# Patient Record
Sex: Male | Born: 1960 | Race: Black or African American | Hispanic: No | Marital: Single | Smoking: Current every day smoker
Health system: Southern US, Community
[De-identification: ages and names within clinical notes are randomized; demographics above are authoritative.]

## PROBLEM LIST (undated history)

## (undated) DIAGNOSIS — I10 Essential (primary) hypertension: Secondary | ICD-10-CM

## (undated) DIAGNOSIS — F329 Major depressive disorder, single episode, unspecified: Secondary | ICD-10-CM

## (undated) DIAGNOSIS — F419 Anxiety disorder, unspecified: Secondary | ICD-10-CM

## (undated) DIAGNOSIS — S34109A Unspecified injury to unspecified level of lumbar spinal cord, initial encounter: Secondary | ICD-10-CM

## (undated) DIAGNOSIS — F32A Depression, unspecified: Secondary | ICD-10-CM

## (undated) DIAGNOSIS — F319 Bipolar disorder, unspecified: Secondary | ICD-10-CM

## (undated) DIAGNOSIS — E119 Type 2 diabetes mellitus without complications: Secondary | ICD-10-CM

## (undated) HISTORY — PX: EYE SURGERY: SHX253

---

## 2010-07-18 DIAGNOSIS — S34109A Unspecified injury to unspecified level of lumbar spinal cord, initial encounter: Secondary | ICD-10-CM

## 2010-07-18 HISTORY — DX: Unspecified injury to unspecified level of lumbar spinal cord, initial encounter: S34.109A

## 2010-07-18 HISTORY — PX: BACK SURGERY: SHX140

## 2014-06-05 ENCOUNTER — Emergency Department (HOSPITAL_COMMUNITY)
Admission: EM | Admit: 2014-06-05 | Discharge: 2014-06-06 | Disposition: A | Discharge status: Other Acute Inpatient Hospital | Payer: Medicaid Other | Attending: Emergency Medicine | Admitting: Emergency Medicine

## 2014-06-05 ENCOUNTER — Encounter (HOSPITAL_COMMUNITY): Payer: Self-pay

## 2014-06-05 DIAGNOSIS — Z79899 Other long term (current) drug therapy: Secondary | ICD-10-CM | POA: Insufficient documentation

## 2014-06-05 DIAGNOSIS — Z9889 Other specified postprocedural states: Secondary | ICD-10-CM | POA: Diagnosis not present

## 2014-06-05 DIAGNOSIS — Z720 Tobacco use: Secondary | ICD-10-CM | POA: Insufficient documentation

## 2014-06-05 DIAGNOSIS — Z87828 Personal history of other (healed) physical injury and trauma: Secondary | ICD-10-CM | POA: Diagnosis not present

## 2014-06-05 DIAGNOSIS — R45851 Suicidal ideations: Secondary | ICD-10-CM

## 2014-06-05 DIAGNOSIS — F319 Bipolar disorder, unspecified: Secondary | ICD-10-CM | POA: Diagnosis not present

## 2014-06-05 DIAGNOSIS — F32A Depression, unspecified: Secondary | ICD-10-CM

## 2014-06-05 DIAGNOSIS — F329 Major depressive disorder, single episode, unspecified: Secondary | ICD-10-CM

## 2014-06-05 HISTORY — DX: Unspecified injury to unspecified level of lumbar spinal cord, initial encounter: S34.109A

## 2014-06-05 HISTORY — DX: Depression, unspecified: F32.A

## 2014-06-05 HISTORY — DX: Bipolar disorder, unspecified: F31.9

## 2014-06-05 HISTORY — DX: Major depressive disorder, single episode, unspecified: F32.9

## 2014-06-05 LAB — CBC
HEMATOCRIT: 48.6 % (ref 39.0–52.0)
Hemoglobin: 16.8 g/dL (ref 13.0–17.0)
MCH: 28.2 pg (ref 26.0–34.0)
MCHC: 34.6 g/dL (ref 30.0–36.0)
MCV: 81.7 fL (ref 78.0–100.0)
Platelets: 272 10*3/uL (ref 150–400)
RBC: 5.95 MIL/uL — AB (ref 4.22–5.81)
RDW: 13.7 % (ref 11.5–15.5)
WBC: 8.3 10*3/uL (ref 4.0–10.5)

## 2014-06-05 LAB — COMPREHENSIVE METABOLIC PANEL
ALT: 28 U/L (ref 0–53)
AST: 20 U/L (ref 0–37)
Albumin: 3.7 g/dL (ref 3.5–5.2)
Alkaline Phosphatase: 93 U/L (ref 39–117)
Anion gap: 14 (ref 5–15)
BUN: 12 mg/dL (ref 6–23)
CO2: 23 mEq/L (ref 19–32)
Calcium: 9.5 mg/dL (ref 8.4–10.5)
Chloride: 103 mEq/L (ref 96–112)
Creatinine, Ser: 0.93 mg/dL (ref 0.50–1.35)
GFR calc Af Amer: >90 mL/min (ref >90)
GFR calc non Af Amer: >90 mL/min (ref >90)
GLUCOSE: 110 mg/dL — AB (ref 70–99)
Potassium: 4.3 mEq/L (ref 3.7–5.3)
SODIUM: 140 meq/L (ref 137–147)
Total Bilirubin: 0.4 mg/dL (ref 0.3–1.2)
Total Protein: 8 g/dL (ref 6.0–8.3)

## 2014-06-05 LAB — RAPID URINE DRUG SCREEN, HOSP PERFORMED
AMPHETAMINES: NOT DETECTED
Barbiturates: NOT DETECTED
Benzodiazepines: NOT DETECTED
Cocaine: NOT DETECTED
Opiates: NOT DETECTED
TETRAHYDROCANNABINOL: NOT DETECTED

## 2014-06-05 LAB — ETHANOL: Alcohol, Ethyl (B): <11 mg/dL (ref 0–11)

## 2014-06-05 LAB — SALICYLATE LEVEL

## 2014-06-05 LAB — ACETAMINOPHEN LEVEL: Acetaminophen (Tylenol), Serum: <15 ug/mL (ref 10–30)

## 2014-06-05 MED ORDER — NICOTINE 21 MG/24HR TD PT24: 21.0000 mg | MEDICATED_PATCH | Freq: Every day | TRANSDERMAL | Status: DC

## 2014-06-05 NOTE — ED Notes (Signed)
Pt states he was released from prison yesterday, went to [facility] today and was informed to come here. Pt has been having SI thoughts since being released yesterday from prison. Denies having a plan just having thoughts.
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
## 2014-06-05 NOTE — ED Notes (Signed)
[place], PA at bedside.  

## 2014-06-06 ENCOUNTER — Encounter (HOSPITAL_COMMUNITY): Payer: Self-pay | Admitting: *Deleted

## 2014-06-06 ENCOUNTER — Observation Stay (HOSPITAL_COMMUNITY)
Admission: AD | Admit: 2014-06-06 | Discharge: 2014-06-06 | Disposition: A | Discharge status: Home / Self Care | Payer: Medicaid Other | Source: Intra-hospital | Attending: Psychiatry | Admitting: Psychiatry

## 2014-06-06 DIAGNOSIS — F17210 Nicotine dependence, cigarettes, uncomplicated: Secondary | ICD-10-CM | POA: Insufficient documentation

## 2014-06-06 DIAGNOSIS — Z9114 Patient's other noncompliance with medication regimen: Secondary | ICD-10-CM | POA: Insufficient documentation

## 2014-06-06 DIAGNOSIS — F3160 Bipolar disorder, current episode mixed, unspecified: Secondary | ICD-10-CM

## 2014-06-06 DIAGNOSIS — Z590 Homelessness: Secondary | ICD-10-CM | POA: Insufficient documentation

## 2014-06-06 DIAGNOSIS — F319 Bipolar disorder, unspecified: Secondary | ICD-10-CM | POA: Insufficient documentation

## 2014-06-06 DIAGNOSIS — F4325 Adjustment disorder with mixed disturbance of emotions and conduct: Principal | ICD-10-CM | POA: Insufficient documentation

## 2014-06-06 DIAGNOSIS — Z885 Allergy status to narcotic agent status: Secondary | ICD-10-CM | POA: Diagnosis not present

## 2014-06-06 MED ORDER — ALUM & MAG HYDROXIDE-SIMETH 200-200-20 MG/5ML PO SUSP: 30.0000 mL | Freq: Four times a day (QID) | ORAL | Status: DC | PRN

## 2014-06-06 MED ORDER — QUETIAPINE FUMARATE 50 MG PO TABS
50.0000 mg | ORAL_TABLET | Freq: Two times a day (BID) | ORAL | Status: DC
  Filled 2014-06-06 (×4): qty 1

## 2014-06-06 MED ORDER — NICOTINE 7 MG/24HR TD PT24
7.0000 mg | MEDICATED_PATCH | Freq: Every day | TRANSDERMAL | Status: DC
  Filled 2014-06-06 (×3): qty 1

## 2014-06-06 MED ORDER — TRAZODONE HCL 50 MG PO TABS: 50.0000 mg | ORAL_TABLET | Freq: Every evening | ORAL | Status: DC | PRN

## 2014-06-06 MED ORDER — QUETIAPINE FUMARATE 100 MG PO TABS: 100.0000 mg | ORAL_TABLET | Freq: Every day | ORAL | Status: DC

## 2014-06-06 MED ORDER — ACETAMINOPHEN 325 MG PO TABS: 650.0000 mg | ORAL_TABLET | Freq: Four times a day (QID) | ORAL | Status: DC | PRN

## 2014-06-06 MED ORDER — QUETIAPINE FUMARATE 100 MG PO TABS
100.0000 mg | ORAL_TABLET | Freq: Every day | ORAL | Status: DC
  Filled 2014-06-06: qty 1
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
## 2014-06-06 NOTE — BH Assessment (Signed)
Tele Assessment Note   Charles Leon is an 53 y.o. male who [facility] with depression, SI thoughts w/plan to "jump in traffic" and has had no psychotropic medications x11 mos.  Pt reports he was just released from prison on 06/04/14 after spending approx 30 days, incarcerated for indecent exposure, destruction of property and stolen property.  Pt states that he went through a manic episode when he committed these acts.  [facility] on [facility]14 and was directed to the emerg dept for assistance.  Pt is currently homeless and told this Clinical research associatewriter he has "nowhere to go".  Pt admits 1 previous SI attempt by [phone](1983).  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  This Clinical research associatewriter inquired why the patient has not been complaint with his meds, he says he feels better when he not taking, less lethargic.  He says he was able to function for approx 9 mos without Risperdal and then began to feel depressed and manic.  Pt says he hears voices telling to do bad things when he is not taking his meds, but he is not actively hearing voices at this time.      Axis I: Bipolar I disorder, Current or most recent episode depressed, Severe Axis II: Deferred Axis III:  Past Medical History  Diagnosis Date  . Lumbar spinal cord injury 2012  . Bipolar disorder   . Depression    Axis IV: economic problems, housing problems, occupational problems, other psychosocial or environmental problems, problems related to legal system/crime, problems related to social environment and problems with primary support group Axis V: 31-40 impairment in reality testing  Past Medical History:  Past Medical History  Diagnosis Date  . Lumbar spinal cord injury 2012  . Bipolar disorder   . Depression     History reviewed. No pertinent past surgical history.  Family History: No family history on file.  Social History:  reports that he has been smoking  Cigarettes.  He has been smoking about 0.00 packs per day. He does not have any smokeless tobacco history on file. He reports that he does not drink alcohol or use illicit drugs.  Additional Social History:  Alcohol / Drug Use Pain Medications: None  Prescriptions: None  Over the Counter: None  History of alcohol / drug use?: Yes Longest period of sobriety (when/how long): 1 sobriety--crack/cocaine, thc   CIWA: CIWA-Ar BP: 122/74 mmHg Pulse Rate: 83 COWS:    PATIENT STRENGTHS: (choose at least two) Motivation for treatment/growth  Allergies:  Allergies  Allergen Reactions  . Haldol [Haloperidol] Other (See Comments)    Stiff neck    Home Medications:  (Not in a hospital admission)  OB/GYN Status:  No LMP for male patient.  General Assessment Data [facility] Is this a Tele or Face-to-Face Assessment?: Tele Assessment Is this an Initial Assessment or a Re-assessment for this encounter?: Initial Assessment Living Arrangements: Other (Comment) (Homeless ) Can pt return to current living arrangement?: Yes Admission Status: Voluntary Is patient capable of signing voluntary admission?: Yes Transfer from: Home Referral Source: Self/Family/Friend  Medical Screening Exam [facility] Walk-in ONLY) Medical Exam completed: No Reason for MSE not completed: Other: (None )  [facility] Crisis [facility] Living Arrangements: Other (Comment) (Homeless ) Name of Psychiatrist: None  Name of Therapist: None   Education Status Is patient currently in school?: No Current Grade: None  Highest grade of school patient has completed:  None  Name of school: None  Contact person: None   Risk to self with the past 6 months Suicidal Ideation: Yes-Currently Present Suicidal Intent: No-Not Currently/Within Last 6 Months Is patient at risk for suicide?: Yes Suicidal Plan?: Yes-Currently Present Specify Current Suicidal Plan: "Jump in traffic" Access to Means: Yes Specify Access to  Suicidal Means: Traffic  What has been your use of drugs/alcohol within the last 12 months?: Hx of crack/cocaine, thc  Previous Attempts/Gestures: Yes How many times?: 1 Other Self Harm Risks: None  Triggers for Past Attempts: Other personal contacts Intentional Self Injurious Behavior: None Recent stressful life event(s): Other (Comment) (Released from prison 06/04/14; off meds x11 mos; homeless) Persecutory voices/beliefs?: No Depression: Yes Depression Symptoms: Loss of interest in usual pleasures, Feeling worthless/self pity Substance abuse history and/or treatment for substance abuse?: Yes Suicide prevention information given to non-admitted patients: Not applicable  Risk to Others within the past 6 months Homicidal Ideation: No Thoughts of Harm to Others: No Current Homicidal Intent: No Current Homicidal Plan: No Access to Homicidal Means: No Identified Victim: None  History of harm to others?: No Assessment of Violence: None Noted Violent Behavior Description: None  Does patient have access to weapons?: No Criminal Charges Pending?: No Does patient have a court date: No  Psychosis Hallucinations: None noted Delusions: None noted  Mental Status Report Appear/Hygiene: Disheveled, In scrubs Eye Contact: Good Motor Activity: Unremarkable Speech: Logical/coherent, Pressured Level of Consciousness: Alert Mood: Depressed Affect: Depressed, Appropriate to circumstance Anxiety Level: None Thought Processes: Coherent, Relevant Judgement: Impaired Orientation: Person, Place, Time, Situation Obsessive Compulsive Thoughts/Behaviors: None  Cognitive Functioning Concentration: Decreased Memory: Recent Intact, Remote Intact IQ: Average Insight: Poor Impulse Control: Fair Appetite: Good Weight Loss: 0 Weight Gain: 0 Sleep: No Change Total Hours of Sleep: 5 Vegetative Symptoms: None  ADLScreening [facility] Assessment Services) Patient's cognitive ability adequate to safely  complete daily activities?: Yes Patient able to express need for assistance with ADLs?: Yes Independently performs ADLs?: Yes (appropriate for developmental age)  Prior Inpatient Therapy Prior Inpatient Therapy: No Prior Therapy Dates: None  Prior Therapy Facilty/Provider(s): None  Reason for Treatment: None   Prior Outpatient Therapy Prior Outpatient Therapy: Yes Prior Therapy Dates: Current  Prior Therapy Facilty/Provider(s): [facility]  Reason for Treatment: Med Mgt  ADL Screening (condition at time of admission) Patient's cognitive ability adequate to safely complete daily activities?: Yes Is the patient deaf or have difficulty hearing?: No Does the patient have difficulty seeing, even when wearing glasses/contacts?: No Does the patient have difficulty concentrating, remembering, or making decisions?: Yes Patient able to express need for assistance with ADLs?: Yes Does the patient have difficulty dressing or bathing?: No Independently performs ADLs?: Yes (appropriate for developmental age) Does the patient have difficulty walking or climbing stairs?: No Weakness of Legs: None Weakness of Arms/Hands: None  Home Assistive Devices/Equipment Home Assistive Devices/Equipment: None  Therapy Consults (therapy consults require a physician order) PT Evaluation Needed: No OT Evalulation Needed: No SLP Evaluation Needed: No Abuse/Neglect Assessment (Assessment to be complete while patient is alone) Physical Abuse: Denies Verbal Abuse: Denies Sexual Abuse: Denies Exploitation of patient/patient's resources: Denies Self-Neglect: Denies Values / Beliefs Cultural Requests During Hospitalization: None Spiritual Requests During Hospitalization: None Consults Spiritual Care Consult Needed: No Social Work Consult Needed: No Merchant navy officerAdvance Directives (For Healthcare) Does patient have an advance directive?: No Would patient like information on creating an advanced directive?: No - patient  declined information Nutrition Screen- [facility] Adult/[facility]/AP Patient's home diet: Regular  Additional  Information 1:1 In Past 12 Months?: No CIRT Risk: No Elopement Risk: No Does patient have medical clearance?: Yes     Disposition:  Disposition Initial Assessment Completed for this Encounter: Yes Disposition of Patient: Referred to (Accepted by Janann August, [facility] Unit #6) Patient referred to: Other (Comment) Janann August, NP accepted to [facility] Unit #6)  Murrell Redden 06/06/2014 1:49 AM

## 2014-06-06 NOTE — ED Notes (Signed)
Pt and pt belongings are departing with [facility] at this time.

## 2014-06-06 NOTE — Progress Notes (Signed)
Pt discharged home. DC instructions provided and explained. Meds reviewed. Rx given. Belongings returned. Pt denies SI, HI, and A/V/H. Stable at discharge.

## 2014-06-06 NOTE — Discharge Summary (Signed)
[facility] OBS UNIT DISCHARGE SUMMARY  Subjective: Pt seen and chart reviewed. Pt reports that he has recently spent many years in prison and that he was in trouble for writing on the walls and was sentenced to 1 yr probation by the judge. Pt states he said "wait, just give me the 2 months back in jail so I don't have to pay the fines and all that" and he was able to serve 60 days in jail, recently released. Pt reports that he had an [facility] and has been clean/sober for almost a year. His UDS is negative, BAL negative. Pt reports that he quit taking his medications and that he was on Seroquel and he was very tired. He reports noncompliance for nearly a year and that, with his stress, he seems to have "spiked into mania and the [facility] thought I was on drugs". Pt reports he was instructed to go to the ED for a drug screen which he did and when [facility], they said it was "voted out" and could not come back, hence making him feel even worse and wanting to harm himse.f  Today, pt denies SI, HI, and AVH, contracts for safety, and is requesting assistance in finding shelter and follow up resources for psychiatry and counseling. [facility] TTS to assist with this process.   HPI: Charles Leon is an 53 y.o. male who [facility] with depression, SI thoughts w/plan to "jump in traffic" and has had no psychotropic medications x11 mos.  Pt reports he was just released from prison on 06/04/14 after spending approx 30 days, incarcerated for indecent exposure, destruction of property and stolen property.  Pt states that he went through a manic episode when he committed these acts.  [facility] and was directed to the emerg dept for assistance.  Pt is currently homeless and told this Clinical research associatewriter he has "nowhere to go".  Pt admits 1 previous SI attempt by [phone](1983).  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  This  Clinical research associatewriter inquired why the patient has not been complaint with his meds, he says he feels better when he not taking, less lethargic.  He says he was able to function for approx 9 mos without Risperdal and then began to feel depressed and manic.  Pt says he hears voices telling to do bad things when he is not taking his meds, but he is not actively hearing voices at this time.      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 Lumbar spinal cord injury 2012  . Bipolar disorder   . Depression    Axis IV: economic problems, housing problems, occupational problems, other psychosocial or environmental problems, problems related to legal system/crime, problems related to social environment and problems with primary support group Axis V: 51-60 moderate symptoms  Psychiatric Specialty Exam: Physical Exam  Review of Systems  Constitutional: Negative.   HENT: Negative.   Eyes: Negative.   Respiratory: Negative.   Cardiovascular: Negative.   Gastrointestinal: Negative.   Genitourinary: Negative.   Musculoskeletal: Negative.   Skin: Negative.   Neurological: Negative.   Endo/Heme/Allergies: Negative.   Psychiatric/Behavioral: Positive for depression. Negative for suicidal ideas (reports that this was secondary to being homeless; pt states resolved now) and substance abuse. The patient is nervous/anxious.  Blood pressure 133/88, pulse 87, temperature 98 F (36.7 C), temperature source Oral, resp. rate 18, height 5\' 7"  (1.702 m), weight 104.327 kg (230 lb).Body mass index is 36.01 kg/(m^2).  General Appearance: Casual and Fairly Groomed  Patent attorneyye Contact::  Good  Speech:  Clear and Coherent and Normal Rate  Volume:  Normal  Mood:  Anxious  Affect:  Appropriate and Congruent  Thought Process:  Circumstantial and Goal Directed  Orientation:  Full (Time, Place, and Person)  Thought  Content:  WDL  Suicidal Thoughts:  No  Homicidal Thoughts:  No  Memory:  Immediate;   Fair Recent;   Fair Remote;   Fair  Judgement:  Fair  Insight:  Good  Psychomotor Activity:  Normal  Concentration:  Good  Recall:  Good  Akathisia:  No  Handed:    AIMS (if indicated):     Assets:  Communication Skills Desire for Improvement Resilience  Sleep:         Past Medical History:  Past Medical History  Diagnosis Date  . Lumbar spinal cord injury 2012  . Bipolar disorder   . Depression     History reviewed. No pertinent past surgical history.  Family History: History reviewed. No pertinent family history.  Social History:  reports that he has been smoking Cigarettes.  He has been smoking about 0.00 packs per day. He does not have any smokeless tobacco history on file. He reports that he does not drink alcohol or use illicit drugs.  Additional Social History:  Alcohol / Drug Use Pain Medications: None  Prescriptions: None  Over the Counter: None  History of alcohol / drug use?: Yes Longest period of sobriety (when/how long): 1 sobriety--crack/cocaine, thc   CIWA: CIWA-Ar BP: 133/88 mmHg Pulse Rate: 87 Nausea and Vomiting: no nausea and no vomiting Tactile Disturbances: none Tremor: no tremor Auditory Disturbances: not present Paroxysmal Sweats: no sweat visible Visual Disturbances: not present Anxiety: no anxiety, at ease Headache, Fullness in Head: none present Agitation: normal activity Orientation and Clouding of Sensorium: oriented and can do serial additions CIWA-Ar Total: 0 COWS: Clinical Opiate Withdrawal Scale (COWS) Resting Pulse Rate: Pulse Rate 80 or below Sweating: No report of chills or flushing Restlessness: Able to sit still Pupil Size: Pupils pinned or normal size for room light Bone or Joint Aches: Not present GI Upset: No GI symptoms Tremor: No tremor Yawning: No yawning Anxiety or Irritability: None Gooseflesh Skin: Skin is  smooth  PATIENT STRENGTHS: (choose at least two) Motivation for treatment/growth  Allergies:  Allergies  Allergen Reactions  . Haldol [Haloperidol] Other (See Comments)    Stiff neck    Home Medications:  Medications Prior to Admission  Medication Sig Dispense Refill  . RisperiDONE (RISPERDAL PO) Take 1 tablet by mouth daily.     Disposition:  -Seek another [facility] or other appropriate housing arrangement. Pt may follow-up on this if we do not hear a response this shift -Discharge patient in early afternoon after lunch with resources for outpatient psychiatry/counseling  Beau FannyWithrow, Thania Woodlief C, FNP-BC 06/06/2014 10:38 AM

## 2014-06-06 NOTE — Progress Notes (Signed)
[facility] INPATIENT:  Family/Significant Other Suicide Prevention Education  Suicide Prevention Education:  Patient Refusal for Family/Significant Other Suicide Prevention Education: The patient Charles Leon has refused to provide written consent for family/significant other to be provided Family/Significant Other Suicide Prevention Education during admission and/or prior to discharge.    Celene KrasRobinson, Levina Boyack G 06/06/2014, 3:32 AM

## 2014-06-06 NOTE — Plan of Care (Signed)
[facility] Observation Crisis Plan  Reason for Crisis Plan:  Crisis Stabilization   Plan of Care:  Referral for IOP  Family Support:    Brother Charles Leon  Current Living Environment:  Living Arrangements: Other (Comment) (homeless)  Insurance:   Hospital Account    Name Acct ID Class Status Primary Coverage   Charles Leon, Charles Leon [id number] BEHAVIORAL HEALTH OBSERVATION Open None        Guarantor Account (for Hospital Account [id number])    Name Relation to Pt Service Area Active? Acct Type   Charles Leon, Charles Leon  [facility] Yes [facility]al [facility]   Address Phone       [address]  [place] , [place] [address] [phone](H)          Coverage Information (for Hospital Account [id number])    Not on file      Legal Guardian:     Primary Care Provider:  No PCP Per Patient  Current Outpatient Providers:  none Psychiatrist:   none  Counselor/Therapist:     Compliant with Medications:  none Additional Information:   Charles Leon, Charles Leon 11/20/20153:30 AM

## 2014-06-06 NOTE — ED Notes (Signed)
[facility] notified of need to transport patient.

## 2014-06-06 NOTE — ED Provider Notes (Signed)
1:55 AM patient alert and auditory Glasgow Coma Score 15, pleasant and cooperative. Accepted in transfer [facility] behavioral [facility] by Dr. Dub MikesLugo. Results for orders placed or performed during the hospital encounter of 06/05/14  Acetaminophen level  Result Value Ref Range   Acetaminophen (Tylenol), Serum <15.0 10 - 30 ug/mL  CBC  Result Value Ref Range   WBC 8.3 4.0 - 10.5 K/uL   RBC 5.95 (H) 4.22 - 5.81 MIL/uL   Hemoglobin 16.8 13.0 - 17.0 g/dL   HCT 16.148.6 09.639.0 - 04.552.0 %   MCV 81.7 78.0 - 100.0 fL   MCH 28.2 26.0 - 34.0 pg   MCHC 34.6 30.0 - 36.0 g/dL   RDW 40.913.7 81.111.5 - 91.415.5 %   Platelets 272 150 - 400 K/uL  Comprehensive metabolic panel  Result Value Ref Range   Sodium 140 137 - 147 mEq/L   Potassium 4.3 3.7 - 5.3 mEq/L   Chloride 103 96 - 112 mEq/L   CO2 23 19 - 32 mEq/L   Glucose, Bld 110 (H) 70 - 99 mg/dL   BUN 12 6 - 23 mg/dL   Creatinine, Ser 7.820.93 0.50 - 1.35 mg/dL   Calcium 9.5 8.4 - 95.610.5 mg/dL   Total Protein 8.0 6.0 - 8.3 g/dL   Albumin 3.7 3.5 - 5.2 g/dL   AST 20 0 - 37 U/L   ALT 28 0 - 53 U/L   Alkaline Phosphatase 93 39 - 117 U/L   Total Bilirubin 0.4 0.3 - 1.2 mg/dL   GFR calc non Af Amer >90 >90 mL/min   GFR calc Af Amer >90 >90 mL/min   Anion gap 14 5 - 15  Ethanol (ETOH)  Result Value Ref Range   Alcohol, Ethyl (B) <11 0 - 11 mg/dL  Salicylate level  Result Value Ref Range   Salicylate Lvl <2.0 (L) 2.8 - 20.0 mg/dL  Urine Drug Screen  Result Value Ref Range   Opiates NONE DETECTED NONE DETECTED   Cocaine NONE DETECTED NONE DETECTED   Benzodiazepines NONE DETECTED NONE DETECTED   Amphetamines NONE DETECTED NONE DETECTED   Tetrahydrocannabinol NONE DETECTED NONE DETECTED   Barbiturates NONE DETECTED NONE DETECTED   No results found.   Doug SouSam Giamarie Bueche, MD 06/06/14 0200

## 2014-06-06 NOTE — Plan of Care (Signed)
[facility] Observation Crisis Plan  Reason for Crisis Plan:  Chronic Mental Illness/Medical Illness   Plan of Care:  Referrals for halfway houses and homeless shelters  Family Support:    Brothers (in [place])  Current Living Environment:  Living Arrangements: Other (Comment) (homeless)  Insurance:  [facility]    Name Acct ID Class Status Primary Coverage   Kendell Banehomas, Zackery [id number] BEHAVIORAL HEALTH OBSERVATION [place]        Guarantor Account (for Hospital Account [id number])    Name Relation to Pt Service Area Active? Acct Type   Kendell Banehomas, Narciso  [facility] Yes Behavioral Health   Address Phone       [address] [place], [place] [address] [phone])          Coverage Information (for Hospital Account [id number])    F/O Payor/Plan Precert #   [facility] [place]    Subscriber Subscriber #   Kendell Banehomas, Jarek [id number] O   Address Phone   [address] [place], [place] [address] [phone]      Legal Guardian:   Self  Primary Care Provider:  No PCP Per Patient  Current Outpatient Providers:  [facility] in [place]  Psychiatrist:    [facility] in [place]  Counselor/Therapist:    [facility] [place]  Compliant with Medications:  No; pt has been off medication for 11 months; does not like the way they make him feel.  Additional Information: After consulting with Claudette Headonrad Withrow, NP it has been determined that pt does not present a life threatening danger to himself or others, and that psychiatric hospitalization is not indicated for him at this time.  Pt would benefit from outpatient treatment and from residential accommodations.  Pt reports that he currently receives outpatient [facility] [place], and he will be advised to continue this.  He is interested in finding placement at a halfway house.  He will be given referral information [facility], and [facility].  He will  also be given contact information for the [facility] and [facility] shelters.  Doylene Canninghomas Donalyn Schneeberger, MA Triage Specialist Raphael GibneyHughes, Earnshaw Patrick 11/20/201511:54 AM

## 2014-06-06 NOTE — ED Notes (Addendum)
[facility]. The admission paperwork is being faxed over for the pt to sign.

## 2014-06-06 NOTE — Progress Notes (Signed)
Patient ID: Charles Leon, male   DOB: April 14, 1961, 53 y.o.   MRN: [id number]  53 year old male admitted to the observation unit for suicidal ideation with no plan. Pt states he was just released from jail 1 day ago after spending 2 months there for assault. He was staying at a halfway house prior to this but they kicked him out due to his behavior. Pt states that his behavior was due to being off of his bipolar medications. He has a history of substance abuse but states that he has currently been clean for 1 year (crack cocaine and THC). Denies HI, and AVH. Oriented to unit. Nutrition offered. Education provided regarding safety and falls. No complaints of pain or discomfort at this time. Q15 min safety checks maintained. Will continue to monitor pt.

## 2014-06-06 NOTE — Discharge Instructions (Signed)
For your ongoing mental health needs, continue the treatment that you receive at [facility]:       [facility]      [address]      [place], [place] [address]      [phone]  If you end up living in an area that is too far away from [facility] to keep your appointments, contact the [facility].  They manage your Medicaid behavioral health benefits, and they can help you find a provider in your area and schedule an appointment.  Their phone number also serves as a crisis number, 24 hours a day, 7 days a week:       [facility]      [address]      [place], [place] [address]      [phone]  You have indicated that you are interested in finding a halfway house to help you maintain your sobriety, while taking care of your need for a place to live.  Consider contacting the following halfway houses:       [facility]      [address]      [place], [place] [address]      [phone]       [facility] [address]      [address]       [place], [place] [address]      [phone]       [facility]      [email or web address]      Visit this site to find [facility] throughout the state of [place]  If you find that you need a homeless shelter, consider one of the following options:       [facility])      [address]      [place], [place] [address]      [phone]       [facility]      [address]      [place], [place] [address]      [phone]

## 2014-06-06 NOTE — ED Notes (Signed)
Tele-psych in progress at bedside.

## 2014-06-15 ENCOUNTER — Encounter (HOSPITAL_COMMUNITY): Payer: Self-pay | Admitting: *Deleted

## 2014-06-15 ENCOUNTER — Emergency Department (HOSPITAL_COMMUNITY)
Admission: EM | Admit: 2014-06-15 | Discharge: 2014-06-15 | Discharge status: Against Medical Advice | Payer: Medicaid Other | Source: Home / Self Care

## 2014-06-15 ENCOUNTER — Emergency Department (HOSPITAL_COMMUNITY)
Admission: EM | Admit: 2014-06-15 | Discharge: 2014-06-15 | Disposition: A | Discharge status: Home / Self Care | Payer: Medicaid Other | Attending: Emergency Medicine | Admitting: Emergency Medicine

## 2014-06-15 ENCOUNTER — Encounter (HOSPITAL_COMMUNITY): Payer: Self-pay

## 2014-06-15 DIAGNOSIS — Y998 Other external cause status: Secondary | ICD-10-CM | POA: Diagnosis not present

## 2014-06-15 DIAGNOSIS — M545 Low back pain, unspecified: Secondary | ICD-10-CM

## 2014-06-15 DIAGNOSIS — S3992XA Unspecified injury of lower back, initial encounter: Secondary | ICD-10-CM | POA: Diagnosis not present

## 2014-06-15 DIAGNOSIS — Y9289 Other specified places as the place of occurrence of the external cause: Secondary | ICD-10-CM | POA: Diagnosis not present

## 2014-06-15 DIAGNOSIS — Z87828 Personal history of other (healed) physical injury and trauma: Secondary | ICD-10-CM | POA: Diagnosis not present

## 2014-06-15 DIAGNOSIS — Z720 Tobacco use: Secondary | ICD-10-CM | POA: Insufficient documentation

## 2014-06-15 DIAGNOSIS — Z046 Encounter for general psychiatric examination, requested by authority: Secondary | ICD-10-CM | POA: Insufficient documentation

## 2014-06-15 DIAGNOSIS — Y9389 Activity, other specified: Secondary | ICD-10-CM | POA: Insufficient documentation

## 2014-06-15 DIAGNOSIS — W1809XA Striking against other object with subsequent fall, initial encounter: Secondary | ICD-10-CM | POA: Insufficient documentation

## 2014-06-15 DIAGNOSIS — R451 Restlessness and agitation: Secondary | ICD-10-CM | POA: Insufficient documentation

## 2014-06-15 MED ORDER — NAPROXEN 500 MG PO TABS
500.0000 mg | ORAL_TABLET | Freq: Two times a day (BID) | ORAL | Status: DC
Start: 2014-06-15 — End: 2015-06-12

## 2014-06-15 MED ORDER — NAPROXEN 500 MG PO TABS
500.0000 mg | ORAL_TABLET | Freq: Once | ORAL | Status: AC
  Administered 2014-06-15: 500 mg via ORAL
  Filled 2014-06-15: qty 1

## 2014-06-15 NOTE — Discharge Instructions (Signed)
Back Pain, Adult Low back pain is very common. About 1 in 5 people have back pain.The cause of low back pain is rarely dangerous. The pain often gets better over time.About half of people with a sudden onset of back pain feel better in just 2 weeks. About 8 in 10 people feel better by 6 weeks.  CAUSES Some common causes of back pain include:  Strain of the muscles or ligaments supporting the spine.  Wear and tear (degeneration) of the spinal discs.  Arthritis.  Direct injury to the back. DIAGNOSIS Most of the time, the direct cause of low back pain is not known.However, back pain can be treated effectively even when the exact cause of the pain is unknown.Answering your caregiver's questions about your overall health and symptoms is one of the most accurate ways to make sure the cause of your pain is not dangerous. If your caregiver needs more information, he or she may order lab work or imaging tests (X-rays or MRIs).However, even if imaging tests show changes in your back, this usually does not require surgery. HOME CARE INSTRUCTIONS For many people, back pain returns.Since low back pain is rarely dangerous, it is often a condition that people can learn to manageon their own.   Remain active. It is stressful on the back to sit or stand in one place. Do not sit, drive, or stand in one place for more than 30 minutes at a time. Take short walks on level surfaces as soon as pain allows.Try to increase the length of time you walk each day.  Do not stay in bed.Resting more than 1 or 2 days can delay your recovery.  Do not avoid exercise or work.Your body is made to move.It is not dangerous to be active, even though your back may hurt.Your back will likely heal faster if you return to being active before your pain is gone.  Pay attention to your body when you bend and lift. Many people have less discomfortwhen lifting if they bend their knees, keep the load close to their bodies,and  avoid twisting. Often, the most comfortable positions are those that put less stress on your recovering back.  Find a comfortable position to sleep. Use a firm mattress and lie on your side with your knees slightly bent. If you lie on your back, put a pillow under your knees.  Only take over-the-counter or prescription medicines as directed by your caregiver. Over-the-counter medicines to reduce pain and inflammation are often the most helpful.Your caregiver may prescribe muscle relaxant drugs.These medicines help dull your pain so you can more quickly return to your normal activities and healthy exercise.  Put ice on the injured area.  Put ice in a plastic bag.  Place a towel between your skin and the bag.  Leave the ice on for 15-20 minutes, 03-04 times a day for the first 2 to 3 days. After that, ice and heat may be alternated to reduce pain and spasms.  Ask your caregiver about trying back exercises and gentle massage. This may be of some benefit.  Avoid feeling anxious or stressed.Stress increases muscle tension and can worsen back pain.It is important to recognize when you are anxious or stressed and learn ways to manage it.Exercise is a great option. SEEK MEDICAL CARE IF:  You have pain that is not relieved with rest or medicine.  You have pain that does not improve in 1 week.  You have new symptoms.  You are generally not feeling well. SEEK   IMMEDIATE MEDICAL CARE IF:   You have pain that radiates from your back into your legs.  You develop new bowel or bladder control problems.  You have unusual weakness or numbness in your arms or legs.  You develop nausea or vomiting.  You develop abdominal pain.  You feel faint. Document Released: 07/04/2005 Document Revised: 01/03/2012 Document Reviewed: 11/05/2013 ExitCare Patient Information 2015 [employer]. This information is not intended to replace advice given to you by your health care provider. Make sure you  discuss any questions you have with your health care provider.  
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
## 2014-06-15 NOTE — ED Notes (Addendum)
Pt denies SI or HI. Yellow socks offered to patient (he is barefoot) pt states "I'm not a fucking duck, fuck you".

## 2014-06-15 NOTE — ED Provider Notes (Signed)
CSN: [id number]     Arrival date & time 06/15/14  2020 History   First MD Initiated Contact with Patient 06/15/14 2026     This chart was scribed for non-physician practitioner, Antony MaduraKelly Kamaal Cast, PA-C working with Mirian MoMatthew Gentry, MD by Arlan OrganAshley Leger, ED Scribe. This patient was seen in room WLCON/WLCON and the patient's care was started at 8:38 PM.   Chief Complaint  Patient presents with  . Homeless   HPI  HPI Comments: Charles Leon is a 53 y.o. male with a PMHx of Bipolar disorder and depression who presents to the Emergency Department complaining of constant, moderate L sided back pain x 10 days. Pt states he tripped over a rug last week resulting in him falling. No aggravating or alleviating factors at this time. He has not tried any OTC or home remedies to help manage symptoms. He denies any fever or chills. No weakness or paresthesia. He denies any bowel or urinary incontinence. No history of cancer. Pt with known allergy to Haldol.  Past Medical History  Diagnosis Date  . Lumbar spinal cord injury 2012  . Bipolar disorder   . Depression    History reviewed. No pertinent past surgical history. No family history on file. History  Substance Use Topics  . Smoking status: Current Every Day Smoker    Types: Cigarettes  . Smokeless tobacco: Not on file  . Alcohol Use: No    Review of Systems  Constitutional: Negative for fever and chills.  Musculoskeletal: Positive for back pain.  Neurological: Negative for weakness.  All other systems reviewed and are negative.   Allergies  Haldol  Home Medications   Prior to Admission medications   Medication Sig Start Date End Date Taking? Authorizing Provider  QUEtiapine (SEROQUEL) 100 MG tablet Take 1 tablet (100 mg total) by mouth at bedtime. 06/07/14   Beau FannyJohn C Withrow, FNP   Triage Vitals: BP 144/98 mmHg  Pulse 100  Temp(Src) 97 F (36.1 C) (Oral)  Resp 16  SpO2 100%   Physical Exam  Constitutional: He is oriented to person,  place, and time. He appears well-developed and well-nourished. No distress.  Nontoxic/nonseptic appearing. Patient eating a whole can of Pringles during encounter; preoccupied by TV. Requesting blanket.  HENT:  Head: Normocephalic and atraumatic.  Eyes: Conjunctivae and EOM are normal. No scleral icterus.  Neck: Normal range of motion.  Cardiovascular: Normal rate, regular rhythm and intact distal pulses.   DP and PT pulses 2+ b/l  Pulmonary/Chest: Effort normal. No respiratory distress.  Respirations even and unlabored  Abdominal: He exhibits no distension.  Musculoskeletal: Normal range of motion.  TTP to L lumbar paraspinal muscles. No bony deformities, step offs, or crepitus to lumbar midline.  Neurological: He is alert and oriented to person, place, and time. He exhibits normal muscle tone. Coordination normal.  Patient able to wiggle toes of feet b/l. Patient ambulatory in ED without assistance. States he usually walks with a cane; no cane noted to be with patient.  Skin: Skin is warm and dry. No rash noted. He is not diaphoretic. No erythema. No pallor.  Psychiatric: His speech is normal. He is agitated. He expresses no homicidal and no suicidal ideation.  Easily agitated by being asked questions about his back pain. He states "call the hospital in [place] and get my records".   Nursing note and vitals reviewed.   ED Course  Procedures (including critical care time)  DIAGNOSTIC STUDIES: Oxygen Saturation is 100% on RA, Normal by my interpretation.  COORDINATION OF CARE: 8:38 PM-Discussed treatment plan with pt at bedside and pt agreed to plan.     Labs Review Labs Reviewed - No data to display  Imaging Review No results found.   EKG Interpretation None      MDM   Final diagnoses:  Left-sided low back pain without sciatica  Agitation    Patient with back pain. Hx of chronic lumbar spinal cord injury. Patient vascularly intact and able to wiggle all toes b/l.  He has been ambulatory in the ED without assistance. No associated bowel/bladder incontinence. No concern for cauda equina. Patient given Naproxen and blanket while in ED. He becomes very agitated during encounter when asked questions which remove his attention from the TV or eating his Pringles. Previously escorted off property for becoming aggressive and yelling profanities. Based on exam, I see no indication for further emergent work up at this time. Will discharge with Naproxen Rx. Return precautions provided.  On discharge, patient becoming very agitated again. Escalated voice, yelling profanity. Very confrontational with staff and police. Patient yelling to police, "You ain't helping me if you're putting me out in the fucking rain. Fuck y'all." Patient, for the second time today, to be escorted off property by police.  I personally performed the services described in this documentation, which was scribed in my presence. The recorded information has been reviewed and is accurate.    Antony MaduraKelly Elah Avellino, PA-C 06/15/14 2059  Mirian MoMatthew Gentry, MD 06/21/14 [phone]
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
## 2014-06-15 NOTE — ED Notes (Signed)
Bed: WLPT4 Expected date:  Expected time:  Means of arrival:  Comments: EMS 

## 2015-06-12 ENCOUNTER — Encounter (HOSPITAL_COMMUNITY): Payer: Self-pay | Admitting: Emergency Medicine

## 2015-06-12 ENCOUNTER — Emergency Department (HOSPITAL_COMMUNITY)
Admission: EM | Admit: 2015-06-12 | Discharge: 2015-06-12 | Discharge status: Against Medical Advice | Payer: Medicaid Other | Attending: Emergency Medicine | Admitting: Emergency Medicine

## 2015-06-12 ENCOUNTER — Emergency Department (HOSPITAL_COMMUNITY)
Admission: EM | Admit: 2015-06-12 | Discharge: 2015-06-13 | Disposition: A | Discharge status: Home / Self Care | Payer: Medicaid Other | Attending: Emergency Medicine | Admitting: Emergency Medicine

## 2015-06-12 DIAGNOSIS — Y92410 Unspecified street and highway as the place of occurrence of the external cause: Secondary | ICD-10-CM | POA: Insufficient documentation

## 2015-06-12 DIAGNOSIS — W1839XA Other fall on same level, initial encounter: Secondary | ICD-10-CM | POA: Diagnosis not present

## 2015-06-12 DIAGNOSIS — Z008 Encounter for other general examination: Secondary | ICD-10-CM | POA: Diagnosis not present

## 2015-06-12 DIAGNOSIS — Y998 Other external cause status: Secondary | ICD-10-CM | POA: Insufficient documentation

## 2015-06-12 DIAGNOSIS — F17210 Nicotine dependence, cigarettes, uncomplicated: Secondary | ICD-10-CM | POA: Insufficient documentation

## 2015-06-12 DIAGNOSIS — S4992XA Unspecified injury of left shoulder and upper arm, initial encounter: Secondary | ICD-10-CM | POA: Diagnosis not present

## 2015-06-12 DIAGNOSIS — Z79899 Other long term (current) drug therapy: Secondary | ICD-10-CM | POA: Insufficient documentation

## 2015-06-12 DIAGNOSIS — M25512 Pain in left shoulder: Secondary | ICD-10-CM | POA: Insufficient documentation

## 2015-06-12 DIAGNOSIS — Y9389 Activity, other specified: Secondary | ICD-10-CM | POA: Insufficient documentation

## 2015-06-12 DIAGNOSIS — F319 Bipolar disorder, unspecified: Secondary | ICD-10-CM | POA: Diagnosis not present

## 2015-06-12 DIAGNOSIS — Z87828 Personal history of other (healed) physical injury and trauma: Secondary | ICD-10-CM | POA: Diagnosis not present

## 2015-06-12 NOTE — ED Notes (Signed)
Patient was here earlier - the patient continues to have shoulder and back pain

## 2015-06-12 NOTE — ED Notes (Signed)
Patient approached nurses station demanding a phone to make an emergency call. Patient was redirected to the room and was asked to wait for a minute until the nurse could come help him. Patient began shouting and cursing, threatened to blow up "this whole mother fucker". Patient was told he needed to calm down and return to his room or he could leave the building. Patient then stated "Im gonna kill you bitch". Security and off duty called to triage. Patient then yelled "You better bring four or five of them cause I'm a big mother fucker". Patient was again given one additional chance to return to his room where he continued to curse at this nurse and make accusations of being racist. Patient was escorted out with his belongings by security and [employer].
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
## 2015-06-12 NOTE — ED Notes (Signed)
Patient is ambulatory without assistance at this time "I gotta piss like a horse".

## 2015-06-13 MED ORDER — MELOXICAM 7.5 MG PO TABS
15.0000 mg | ORAL_TABLET | Freq: Every day | ORAL | Status: DC
Start: 2015-06-13 — End: 2015-07-26
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
## 2015-06-13 NOTE — ED Notes (Signed)
Bed: [id number] Expected date:  Expected time:  Means of arrival:  Comments: Kendell Banehomas, [place]

## 2015-06-13 NOTE — Discharge Instructions (Signed)
Take Motrin as prescribed for shoulder pain. Stretch your shoulder a few times per day. We also recommend that you apply ice 3-4 times per day for pain and swelling. You have been prescribed a cane. You may obtain this at [facility] which is located that [address] in [place]. They also have other locations in the area where you may go to obtain a cane. Follow-up with an orthopedist for further evaluation of the shoulder pain. We also recommend that you follow-up with a primary care doctor.  Shoulder Pain The shoulder is the joint that connects your arms to your body. The bones that form the shoulder joint include the upper arm bone (humerus), the shoulder blade (scapula), and the collarbone (clavicle). The top of the humerus is shaped like a ball and fits into a rather flat socket on the scapula (glenoid cavity). A combination of muscles and strong, fibrous tissues that connect muscles to bones (tendons) support your shoulder joint and hold the ball in the socket. Small, fluid-filled sacs (bursae) are located in different areas of the joint. They act as cushions between the bones and the overlying soft tissues and help reduce friction between the gliding tendons and the bone as you move your arm. Your shoulder joint allows a wide range of motion in your arm. This range of motion allows you to do things like scratch your back or throw a ball. However, this range of motion also makes your shoulder more prone to pain from overuse and injury. Causes of shoulder pain can originate from both injury and overuse and usually can be grouped in the following four categories:  Redness, swelling, and pain (inflammation) of the tendon (tendinitis) or the bursae (bursitis).  Instability, such as a dislocation of the joint.  Inflammation of the joint (arthritis).  Broken bone (fracture). HOME CARE INSTRUCTIONS   Apply ice to the sore area.  Put ice in a plastic bag.  Place a towel between your  skin and the bag.  Leave the ice on for 15-20 minutes, 3-4 times per day for the first 2 days, or as directed by your health care provider.  Stop using cold packs if they do not help with the pain.  If you have a shoulder sling or immobilizer, wear it as long as your caregiver instructs. Only remove it to shower or bathe. Move your arm as little as possible, but keep your hand moving to prevent swelling.  Squeeze a soft ball or foam pad as much as possible to help prevent swelling.  Only take over-the-counter or prescription medicines for pain, discomfort, or fever as directed by your caregiver. SEEK MEDICAL CARE IF:   Your shoulder pain increases, or new pain develops in your arm, hand, or fingers.  Your hand or fingers become cold and numb.  Your pain is not relieved with medicines. SEEK IMMEDIATE MEDICAL CARE IF:   Your arm, hand, or fingers are numb or tingling.  Your arm, hand, or fingers are significantly swollen or turn white or blue. MAKE SURE YOU:   Understand these instructions.  Will watch your condition.  Will get help right away if you are not doing well or get worse.   This information is not intended to replace advice given to you by your health care provider. Make sure you discuss any questions you have with your health care provider.   Document Released: 04/13/2005 Document Revised: 07/25/2014 Document Reviewed: 10/27/2014 Elsevier Interactive Patient Education [employer]2016 Elsevier Inc.  Emergency Department Resource Guide 1) Find a Doctor and Pay Out of Pocket Although you won't have to find out who is covered by your insurance plan, it is a good idea to ask around and get recommendations. You will then need to call the office and see if the doctor you have chosen will accept you as a new patient and what types of options they offer for patients who are self-pay. Some doctors offer discounts or will set up payment plans for their patients who do not have insurance,  but you will need to ask so you aren't surprised when you get to your appointment.  2) Contact Your Local Health Department Not all health departments have doctors that can see patients for sick visits, but many do, so it is worth a call to see if yours does. If you don't know where your local health department is, you can check in your phone book. The CDC also has a tool to help you locate your state's health department, and many state websites also have listings of all of their local health departments.  3) Find a Walk-in Clinic If your illness is not likely to be very severe or complicated, you may want to try a walk in clinic. These are popping up all over the country in pharmacies, drugstores, and shopping centers. They're usually staffed by nurse practitioners or physician assistants that have been trained to treat common illnesses and complaints. They're usually fairly quick and inexpensive. However, if you have serious medical issues or chronic medical problems, these are probably not your best option.  No Primary Care Doctor: - Call Health Connect at  [phone] - they can help you locate a primary care doctor that  accepts your insurance, provides certain services, etc. - Physician Referral Service- [phone]  Chronic Pain Problems: Organization         Address  Phone   Notes  [facility]  [phone] Patients need to be referred by their primary care doctor.   Medication Assistance: Organization         Address  Phone   Notes  [facility] [address] [place], [place] [address] [phone] --Must be a resident of [facility] -- Must have NO insurance coverage whatsoever (no Medicaid/ Medicare, etc.) -- The pt. MUST have a primary care doctor that directs their care regularly and follows them in the community   [facility]  [phone]   [employer]  [phone]    Agencies that provide inexpensive  medical care: Organization         Address  Phone   Notes  [facility]  [phone]   [facility]    [phone]   [facility] [address] [place], [place] [address] [phone]   [facility] [address] [place] [place] [phone]   Planned Parenthood    [phone]   [facility]    [phone]   [facility]  [address], [place] Phone:  [phone], Fax:  [phone] Hours of Operation:  9 am - 6 pm, M-F.  Also accepts Medicaid/Medicare and self-pay.  [facility]  [address], [place] Phone: [phone], Fax: [phone]. Hours of Operation:  8:30 am - 5:30 pm, M-F.  Also accepts Medicaid and self-pay.  [facility] [place]  [address] [employer] Phone: [phone]   [facility] [address] Natasha Bence [place], [place] [phone], Ext. [phone] Mondays & Thursdays: 7-9 AM.  First 15 patients are seen on a first come, first serve basis.    Medicaid-accepting [facility] Providers:  Organization         Address  Phone   Notes  [facility] [address], [place] [phone] Also accepts self-pay patients.  [facility] [address] Laurell Josephs [place], [place]  [phone]   [facility] [address] [place] [address], [place] [phone]   [facility] [address] [place] [phone]   Renaye Rakers [address], [place]   [phone] Only accepts [place] patients after they have their name applied to their card.   Self-Pay (no insurance) in [facility]:  Organization         Address  Phone   Notes  Sickle Cell Patients, [facility] Internal Medicine [address] [place], [place] [phone]   [facility] [address] [place], [place] [phone]   [facility] [place]  [address], [place] [phone]   [facility]/Dr. Osei-Bonsu  [address], [place] or [address], [place] [phone] Phone number for both [place] and [employer] locations is the same.  [facility] [address], [place] [phone]   [facility] [address], [place] or [address] [phone]   [facility] [address], [place] [phone], phone; [phone], fax Sees patients 1st and 3rd Saturday of every month.  Must not qualify for public or private insurance (i.e. Medicaid, Medicare, [place] Health Choice, Veterans' Benefits)  Household income should be no more than 200% of the poverty level The clinic cannot treat you if you are pregnant or think you are pregnant  Sexually transmitted diseases are not treated at the clinic.    Dental Care: Organization         Address  Phone  Notes  [facility] Department of [facility] [address] [place], [place] [phone] Accepts children up to age 41 who are enrolled in [place] or [place] Health Choice; pregnant women with a Medicaid card; and children who have applied for Medicaid or [place] Health Choice, but were declined, whose parents can pay a reduced fee at time of service.  [facility] Department of [facility]  [address], [place] [phone] Accepts children up to age 57 who are enrolled in [place] or [place] Health Choice; pregnant women with a Medicaid card; and children who have applied for Medicaid or [place] Health Choice, but were declined, whose parents can pay a reduced fee at time of service.  [facility]  [address] [place], [place] [phone] Patients are seen by appointment only. Walk-ins are not accepted.  [facility] will see patients 73 years of age and older. Monday - Tuesday (8am-5pm) Most Wednesdays (8:30-5pm) $30 per visit, cash only  [facility]  [address], [place] [phone] Patients are seen by appointment only. Walk-ins are not accepted. [facility] will see patients 69 years of age and older.  One Wednesday Evening (Monthly: Volunteer Based).  $30 per visit, cash only  [employer]  [phone] for adults; Children under age 70, call Graduate Pediatric Dentistry at [phone]. Children aged 63-14, please call [phone] to request a pediatric application.  Dental services are provided in all areas of dental care including fillings, crowns and bridges, complete and partial dentures, implants, gum treatment, root canals, and extractions. Preventive care is also provided. Treatment is provided to both adults and children. Patients are selected via a lottery and there is often a waiting list.   [facility] [address], [place]  [phone] [email or web address]   [facility] [address] [place], [place] [phone], Ext. [phone] Second and Fourth Thursday of each month, opens at 6:30 AM; Clinic ends at 9 AM.  Patients are seen on a first-come first-served basis, and a limited number are seen during each clinic.   [facility]  [address] Ether Griffins [place], [place] [phone]   Eligibility Requirements You must have lived in [place], [place], or [place] counties for at least the last three months.   You cannot be eligible for state or federal sponsored [place], including [employer], [place], or [employer].   You generally cannot be eligible for healthcare insurance through your employer.    How to apply: Eligibility screenings are held every Tuesday and Wednesday afternoon from 1:00 pm until 4:00 pm. You do not need an appointment for the  interview!  [facility] [address] [place], [place] [phone]   [facility]  [phone]   [facility]  [phone]   [facility]  [phone]    Behavioral Health Resources in the Community: Intensive Outpatient Programs Organization         Address  Phone  Notes  [facility] [address] [place], [place] [phone]   [facility] Outpatient [address] [place], [place] [phone]   [facility] [address], [place], [place]  [phone]   [facility] [address]  [place], [place] [phone] or [phone]   Substance Abuse Resources Organization         Address  Phone  Notes  [facility]  [phone]   [facility]  [phone]   [facility]  [phone]   Floydene Flock  [phone]   Residential & Outpatient Substance Abuse Program  [phone]   Psychological Services Organization         Address  Phone  Notes  [facility]  [phone]   [facility]  [phone]   [facility] Mental [facility] [address] [place] [phone] or [phone]    Mobile Crisis Teams Organization         Address  Phone  Notes  [facility], Mobile Crisis Care Unit  [phone]   [facility]  [address] [place], [place] [phone]   Doristine Locks [address] [place] [phone]    Self-Help/Support Groups Organization         Address  Phone             Notes  [facility] [place] - variety of support groups  [phone]- I7437963 Call for more information  Narcotics Anonymous (NA), [facility] [address], [employer] [place]  2 meetings at this location  Residential Treatment Programs Organization         Address  Phone  Notes  [facility]  [address],    [place]  [phone]   [facility]  [address] [place] [address], [place], [place]   [place], [place] [phone] Admissions: 8am-3pm M-F  [facility]cent[facility] Substance [place] [address],    [place], [place] [phone]   [facility] [address] [place], [place], [place]   [facility] [address]  [place], [place]   [facility] - Intensive Outpatient [address] Kristeen [address], [place], [place]   [facility] [place].,  [place], [place] [phone] or [phone]   [facility] ([facility]) [address], [place], [place] Accepts Medicaid  [facility] [address],  [place] [phone] Substance Abuse/Addiction Treatment   [facility] Organization         Address  Phone  Notes  [facility]  [phone]   Domenic Schwab, PhD [address] Arlis Porta [place], [place]   [phone] or [phone]   [place] [place], [place] [phone]   [facility] [address] [place], [place] [phone] Insurance/Medicaid/sponsorship through [facility] and Families [address] [place]                                    [place], [place] [phone] [place] [address], [place] [phone]    Dr. Adele Schilder  [phone]   [facility] Dept. 1) [address] [place] 2) [place] 3)  [address], [place] [phone]  [phone]   [place] [phone] or [phone] (After Hours)

## 2015-06-22 NOTE — ED Provider Notes (Signed)
CSN: [id number]     Arrival date & time 06/12/15  2246 History   First MD Initiated Contact with Patient 06/13/15 [phone]     Chief Complaint  Patient presents with  . Shoulder Pain     (Consider location/radiation/quality/duration/timing/severity/associated sxs/prior Treatment) Patient is a 54 y.o. male presenting with shoulder pain. The history is provided by the patient. No language interpreter was used.  Shoulder Pain Location:  Shoulder Time since incident:  2 weeks Injury: no (Pain began after lifting)   Shoulder location:  L shoulder Pain details:    Quality:  Aching and throbbing   Radiates to:  Does not radiate   Severity:  Moderate   Onset quality:  Gradual   Duration:  2 weeks   Timing:  Constant   Progression:  Worsening Chronicity:  Recurrent Dislocation: no   Prior injury to area:  Yes (hx rotator cuff surgery) Relieved by:  Nothing Worsened by:  Movement Associated symptoms: stiffness   Associated symptoms: no decreased range of motion, no muscle weakness, no numbness, no swelling and no tingling     Past Medical History  Diagnosis Date  . Lumbar spinal cord injury (HCC) 2012  . Bipolar disorder (HCC)   . Depression    History reviewed. No pertinent past surgical history. History reviewed. No pertinent family history. Social History  Substance Use Topics  . Smoking status: Current Every Day Smoker    Types: Cigarettes  . Smokeless tobacco: None  . Alcohol Use: No     Comment: occ, denies recent injestion    Review of Systems  Musculoskeletal: Positive for arthralgias and stiffness.  Neurological: Negative for weakness and numbness.  All other systems reviewed and are negative.   Allergies  Haldol  Home Medications   Prior to Admission medications   Medication Sig Start Date End Date Taking? Authorizing Provider  PRESCRIPTION MEDICATION Take 1 tablet by mouth 2 (two) times daily. Unknown diabetic medication.   Yes Historical Provider, MD    QUEtiapine (SEROQUEL) 100 MG tablet Take 1 tablet (100 mg total) by mouth at bedtime. 06/07/14  Yes Beau Fanny, FNP  meloxicam (MOBIC) 7.5 MG tablet Take 2 tablets (15 mg total) by mouth daily. 06/13/15   Antony Madura, PA-C   BP 102/69 mmHg  Pulse 96  Temp(Src) 98.1 F (36.7 C) (Oral)  Resp 14  SpO2 98%   Physical Exam  Constitutional: He is oriented to person, place, and time. He appears well-developed and well-nourished. No distress.  HENT:  Head: Normocephalic and atraumatic.  Eyes: Conjunctivae and EOM are normal. No scleral icterus.  Neck: Normal range of motion.  Cardiovascular: Normal rate, regular rhythm and intact distal pulses.   Distal radial pulse 2+ in the LUE  Pulmonary/Chest: Effort normal. No respiratory distress.  Respirations even and unlabored  Musculoskeletal: Normal range of motion.       Left shoulder: He exhibits tenderness. He exhibits normal range of motion, no bony tenderness, no crepitus, no spasm, normal pulse and normal strength.       Arms: Neurological: He is alert and oriented to person, place, and time. He exhibits normal muscle tone. Coordination normal.  Sensation intact in b/l upper extremities. Speech is goal oriented. Patient ambulates with steady gait.  Skin: Skin is warm and dry. No rash noted. He is not diaphoretic. No erythema. No pallor.  Psychiatric: He has a normal mood and affect. His behavior is normal.  Nursing note and vitals reviewed.   ED Course  Procedures (including critical care time) Labs Review Labs Reviewed - No data to display  Imaging Review No results found.   I have personally reviewed and evaluated these images and lab results as part of my medical decision-making.   EKG Interpretation None      MDM   Final diagnoses:  Left shoulder pain    54 year old male presents to the emergency department for evaluation of left shoulder pain. He is neurovascularly intact. No crepitus or deformity. Range of  motion preserved. No history of direct trauma or injury to suggest fracture. Will discharge with instructions for supportive care. Patient discharged in good condition with no unaddressed concerns.   Filed Vitals:   06/12/15 2253 06/13/15 0329 06/13/15 0516  BP: 129/83 101/80 102/69  Pulse: 110 116 96  Temp: 98.3 F (36.8 C)  98.1 F (36.7 C)  TempSrc: Oral  Oral  Resp: 18 15 14   SpO2: 94% 97% 98%     Antony MaduraKelly Elton Catalano, PA-C 06/22/15 [id number]  Derwood KaplanAnkit Nanavati, MD 06/24/15 1615

## 2015-07-26 ENCOUNTER — Encounter (HOSPITAL_COMMUNITY): Payer: Self-pay | Admitting: *Deleted

## 2015-07-26 ENCOUNTER — Encounter (HOSPITAL_COMMUNITY): Payer: Self-pay

## 2015-07-26 ENCOUNTER — Emergency Department (HOSPITAL_COMMUNITY)
Admission: EM | Admit: 2015-07-26 | Discharge: 2015-07-26 | Disposition: A | Discharge status: Home / Self Care | Payer: Medicaid Other | Source: Home / Self Care | Attending: Emergency Medicine | Admitting: Emergency Medicine

## 2015-07-26 ENCOUNTER — Emergency Department (HOSPITAL_COMMUNITY)
Admission: EM | Admit: 2015-07-26 | Discharge: 2015-07-26 | Disposition: A | Discharge status: Home / Self Care | Payer: Medicaid Other | Attending: Emergency Medicine | Admitting: Emergency Medicine

## 2015-07-26 ENCOUNTER — Emergency Department (HOSPITAL_COMMUNITY): Payer: Medicaid Other

## 2015-07-26 ENCOUNTER — Encounter (HOSPITAL_COMMUNITY): Payer: Self-pay | Admitting: Emergency Medicine

## 2015-07-26 DIAGNOSIS — W19XXXA Unspecified fall, initial encounter: Secondary | ICD-10-CM

## 2015-07-26 DIAGNOSIS — Y9289 Other specified places as the place of occurrence of the external cause: Secondary | ICD-10-CM | POA: Insufficient documentation

## 2015-07-26 DIAGNOSIS — W009XXA Unspecified fall due to ice and snow, initial encounter: Secondary | ICD-10-CM

## 2015-07-26 DIAGNOSIS — G8929 Other chronic pain: Secondary | ICD-10-CM | POA: Diagnosis not present

## 2015-07-26 DIAGNOSIS — S299XXA Unspecified injury of thorax, initial encounter: Secondary | ICD-10-CM | POA: Diagnosis not present

## 2015-07-26 DIAGNOSIS — E669 Obesity, unspecified: Secondary | ICD-10-CM

## 2015-07-26 DIAGNOSIS — Y9301 Activity, walking, marching and hiking: Secondary | ICD-10-CM | POA: Insufficient documentation

## 2015-07-26 DIAGNOSIS — W101XXA Fall (on)(from) sidewalk curb, initial encounter: Secondary | ICD-10-CM | POA: Insufficient documentation

## 2015-07-26 DIAGNOSIS — M25512 Pain in left shoulder: Secondary | ICD-10-CM

## 2015-07-26 DIAGNOSIS — W000XXA Fall on same level due to ice and snow, initial encounter: Secondary | ICD-10-CM | POA: Insufficient documentation

## 2015-07-26 DIAGNOSIS — S93401A Sprain of unspecified ligament of right ankle, initial encounter: Secondary | ICD-10-CM | POA: Insufficient documentation

## 2015-07-26 DIAGNOSIS — Z79899 Other long term (current) drug therapy: Secondary | ICD-10-CM

## 2015-07-26 DIAGNOSIS — Y998 Other external cause status: Secondary | ICD-10-CM | POA: Insufficient documentation

## 2015-07-26 DIAGNOSIS — F17210 Nicotine dependence, cigarettes, uncomplicated: Secondary | ICD-10-CM

## 2015-07-26 DIAGNOSIS — Y9389 Activity, other specified: Secondary | ICD-10-CM | POA: Insufficient documentation

## 2015-07-26 DIAGNOSIS — F319 Bipolar disorder, unspecified: Secondary | ICD-10-CM | POA: Insufficient documentation

## 2015-07-26 DIAGNOSIS — S4992XA Unspecified injury of left shoulder and upper arm, initial encounter: Secondary | ICD-10-CM

## 2015-07-26 DIAGNOSIS — Y92480 Sidewalk as the place of occurrence of the external cause: Secondary | ICD-10-CM

## 2015-07-26 DIAGNOSIS — S199XXA Unspecified injury of neck, initial encounter: Secondary | ICD-10-CM | POA: Insufficient documentation

## 2015-07-26 MED ORDER — MELOXICAM 7.5 MG PO TABS
15.0000 mg | ORAL_TABLET | Freq: Every day | ORAL | Status: DC
Start: 2015-07-26 — End: 2015-12-19

## 2015-07-26 MED ORDER — IBUPROFEN 800 MG PO TABS: 800.0000 mg | ORAL_TABLET | Freq: Three times a day (TID) | ORAL | Status: DC

## 2015-07-26 MED ORDER — TRAMADOL HCL 50 MG PO TABS
50.0000 mg | ORAL_TABLET | Freq: Once | ORAL | Status: AC
  Administered 2015-07-26: 50 mg via ORAL
  Filled 2015-07-26: qty 1

## 2015-07-26 MED ORDER — NAPROXEN 500 MG PO TABS
500.0000 mg | ORAL_TABLET | Freq: Once | ORAL | Status: AC
  Administered 2015-07-26: 500 mg via ORAL
  Filled 2015-07-26: qty 1

## 2015-07-26 NOTE — Discharge Instructions (Signed)
Avoid walking on ice and slippery surfaces.

## 2015-07-26 NOTE — ED Provider Notes (Signed)
CSN: [id number]     Arrival date & time 07/26/15  0011 History   First MD Initiated Contact with Patient 07/26/15 0033     Chief Complaint  Patient presents with  . Fall  . Shoulder Pain     (Consider location/radiation/quality/duration/timing/severity/associated sxs/prior Treatment) HPI Comments: Patient is a 55 year old male with a history of bipolar disorder and depression. He presents to the emergency Department with complaints of upper back and left shoulder pain after a fall secondary to a level on ice. Patient is supposed be using a cane, but does not have one. He was given a prescription by this writer for a cane at his prior visit, but lost the prescription. In triage, the patient denies hitting his head due to his fall. He reports to me that he hit the front of his head, though I do not see how this would be possible given that he had impact to his posterior left shoulder. No c/o N/V or extremity numbness or weakness. No bowel/bladder incontinence.. Patient reports he was just released from jail a few days ago.  Patient is a 55 y.o. male presenting with fall and shoulder pain. The history is provided by the patient. No language interpreter was used.  Fall Associated symptoms include arthralgias. Pertinent negatives include no nausea, numbness, vomiting or weakness.  Shoulder Pain Associated symptoms: back pain     Past Medical History  Diagnosis Date  . Lumbar spinal cord injury (HCC) 2012  . Bipolar disorder (HCC)   . Depression    History reviewed. No pertinent past surgical history. History reviewed. No pertinent family history. Social History  Substance Use Topics  . Smoking status: Current Every Day Smoker    Types: Cigarettes  . Smokeless tobacco: None  . Alcohol Use: No     Comment: occ, denies recent injestion    Review of Systems  Gastrointestinal: Negative for nausea and vomiting.  Genitourinary:       Negative for incontinence.  Musculoskeletal: Positive  for back pain and arthralgias.  Neurological: Negative for syncope, weakness and numbness.  All other systems reviewed and are negative.   Allergies  Haldol  Home Medications   Prior to Admission medications   Medication Sig Start Date End Date Taking? Authorizing Provider  QUEtiapine (SEROQUEL) 100 MG tablet Take 1 tablet (100 mg total) by mouth at bedtime. 06/07/14  Yes Beau FannyJohn C Withrow, FNP  risperiDONE (RISPERDAL) 1 MG tablet Take 1 mg by mouth at bedtime.   Yes Historical Provider, MD  meloxicam (MOBIC) 7.5 MG tablet Take 2 tablets (15 mg total) by mouth daily. 07/26/15   Antony MaduraKelly Lakea Mittelman, PA-C   BP 113/87 mmHg  Pulse 109  Temp(Src) 98.8 F (37.1 C) (Oral)  Resp 17  SpO2 100%   Physical Exam  Constitutional: He is oriented to person, place, and time. He appears well-developed and well-nourished. No distress.  Nontoxic/nonseptic appearing  HENT:  Head: Normocephalic and atraumatic.  No scalp contusion or hematoma. No skull and stability. No battle sign or raccoons eyes.  Eyes: Conjunctivae and EOM are normal. No scleral icterus.  Neck: Normal range of motion.  Cardiovascular: Normal rate, regular rhythm and intact distal pulses.   Distal radial pulse 2+ in the LUE  Pulmonary/Chest: Effort normal. No respiratory distress.  Respirations even and unlabored  Musculoskeletal: Normal range of motion. He exhibits tenderness.  TTP to the anterior L shoulder joint without bony deformity or crepitus. Normal ROM of the LUE.   Neurological: He is alert and  oriented to person, place, and time. He exhibits normal muscle tone. Coordination normal.  Sensation to light touch in the LUE. Grip strength 5/5.  Skin: Skin is warm and dry. No rash noted. He is not diaphoretic. No erythema. No pallor.  Psychiatric: He has a normal mood and affect. His behavior is normal.  Nursing note and vitals reviewed.   ED Course  Procedures (including critical care time) Labs Review Labs Reviewed - No data to  display  Imaging Review Dg Shoulder Left  07/26/2015  CLINICAL DATA:  Status post fall, with left shoulder pain. Initial encounter. EXAM: LEFT SHOULDER - 2+ VIEW COMPARISON:  None. FINDINGS: There is no evidence of fracture or dislocation. The patient is status post left-sided rotator cuff repair, with mild degenerative change at the distal insertion of the rotator cuff. Minimal degenerative change is noted at the left acromioclavicular joint. The left humeral head is seated within the glenoid fossa. No significant soft tissue abnormalities are seen. The visualized portions of the lungs are clear. Cervical spinal fusion hardware is noted. IMPRESSION: No evidence of acute fracture or dislocation. If the patient's symptoms persist, MRI could be considered to assess for underlying internal derangement. Electronically Signed   By: Roanna Raider M.D.   On: 07/26/2015 01:08     I have personally reviewed and evaluated these images and lab results as part of my medical decision-making.   EKG Interpretation None      MDM   Final diagnoses:  Chronic left shoulder pain  Fall from slipping on ice, initial encounter    55 year old male presents to the emergency department for evaluation of left shoulder pain secondary to a slip and fall. No evidence of head trauma. No reported LOC. Patient is neurovascularly intact. No crepitus or deformity. Range of motion preserved. No evidence of acute fracture or dislocation on Xray. Will discharge with instructions for supportive care. He has been given an orthopedic referral as well as a Facilities manager. Patient discharged in good condition with no unaddressed concerns.   Filed Vitals:   07/26/15 0036 07/26/15 0132  BP: 151/85 113/87  Pulse: 107 109  Temp: 98.8 F (37.1 C)   TempSrc: Oral   Resp: 18 17  SpO2: 97% 100%     Antony Madura, PA-C 07/26/15 1610  Gilda Crease, MD 07/26/15 [phone]
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
## 2015-07-26 NOTE — ED Provider Notes (Signed)
CSN: [id number]     Arrival date & time 07/26/15  1745 History  By signing my name below, I, Charles Leon, attest that this documentation has been prepared under the direction and in the presence of Federated Department StoresHanna Patel-Mills, PA-C. Electronically Signed: Ronney LionSuzanne Leon, ED Scribe. 07/26/2015. 8:17 PM.    Chief Complaint  Patient presents with  . Fall  . Neck Pain  . Shoulder Pain   The history is provided by the patient. No language interpreter was used.    HPI Comments: Charles Leon is a 55 y.o. male with a history of lumbar spinal cord injury, bipolar, and depression, who presents to the Emergency Department brought in by ambulance S/P slipping and falling on ice while walking on the sidewalk today, complaining of sudden-onset constant, mild neck stiffness and left shoulder pain since falling. Patient reports he landed on his back but was wearing a backpack, which cushioned his fall, and denies hitting his head. He reports that he could not get up because it was slippery and a man walking by tried to help also but was unsuccessful because it was slippery.  He states EMS was called and was able to help him up. He also denies LOC. Per medical records, [facility] after falling on ice about 20 hours ago. He states he is asymptomatic presently but came just to be checked out.  He denies any headache, vision changes, neck or back pain, abdominal pain, nausea, vomiting.    Past Medical History  Diagnosis Date  . Lumbar spinal cord injury (HCC) 2012  . Bipolar disorder (HCC)   . Depression    History reviewed. No pertinent past surgical history. No family history on file. Social History  Substance Use Topics  . Smoking status: Current Every Day Smoker    Types: Cigarettes  . Smokeless tobacco: None  . Alcohol Use: No     Comment: occ, denies recent injestion    Review of Systems  Eyes: Negative for visual disturbance.  Neurological: Negative for dizziness, syncope, weakness and  numbness.  All other systems reviewed and are negative.   A complete 10 system review of systems was obtained and all systems are negative except as noted in the HPI and PMH.    Allergies  Haldol  Home Medications   Prior to Admission medications   Medication Sig Start Date End Date Taking? Authorizing Provider  meloxicam (MOBIC) 7.5 MG tablet Take 2 tablets (15 mg total) by mouth daily. 07/26/15   Antony MaduraKelly Humes, PA-C  QUEtiapine (SEROQUEL) 100 MG tablet Take 1 tablet (100 mg total) by mouth at bedtime. 06/07/14   Beau FannyJohn C Withrow, FNP  risperiDONE (RISPERDAL) 1 MG tablet Take 1 mg by mouth at bedtime.    Historical Provider, MD   BP 132/74 mmHg  Pulse 60  Temp(Src) 97.8 F (36.6 C) (Oral)  Resp 20  SpO2 100% Physical Exam  Constitutional: He is oriented to person, place, and time. He appears well-developed and well-nourished. No distress.  HENT:  Head: Normocephalic and atraumatic.  Eyes: Conjunctivae and EOM are normal.  Neck: Normal range of motion. Neck supple.  No midline cervical or lumbar tenderness.   Cardiovascular: Normal rate.   Pulmonary/Chest: Effort normal. No respiratory distress.  Musculoskeletal: Normal range of motion.  Neurological: He is alert and oriented to person, place, and time. He has normal strength. No sensory deficit. GCS eye subscore is 4. GCS verbal subscore is 5. GCS motor subscore is 6.  5/5 upper  extremity strength. GCS 15.  No motor deficit.  Ambulatory with steady gait.    Skin: Skin is warm and dry.  Psychiatric: He has a normal mood and affect. His behavior is normal.  Nursing note and vitals reviewed.   ED Course  Procedures (including critical care time)  DIAGNOSTIC STUDIES: Oxygen Saturation is 100% on RA, normal by my interpretation.    COORDINATION OF CARE: 8:06 PM - Negative XR's discussed with pt. Will discharge home. Pt verbalized understanding and agreed to plan.   Imaging Review Dg Cervical Spine Complete  07/26/2015  CLINICAL  DATA:  Fall on ice with neck pain, initial encounter EXAM: CERVICAL SPINE - COMPLETE 4+ VIEW COMPARISON:  None. FINDINGS: Seven cervical segments are well visualized. Postsurgical changes are noted at C3-4 and C4-5 with anterior fixation. Osteophytic changes are noted at C5-6 and C6-7. No acute fracture or acute facet abnormality is noted. The odontoid is within normal limits. No soft tissue changes are seen. IMPRESSION: Postsurgical changes as well as mild degenerative change. No acute abnormality is noted. Electronically Signed   By: Alcide Clever M.D.   On: 07/26/2015 19:16   Dg Shoulder Left  07/26/2015  CLINICAL DATA:  Status post fall, with left shoulder pain. Initial encounter. EXAM: LEFT SHOULDER - 2+ VIEW COMPARISON:  None. FINDINGS: There is no evidence of fracture or dislocation. The patient is status post left-sided rotator cuff repair, with mild degenerative change at the distal insertion of the rotator cuff. Minimal degenerative change is noted at the left acromioclavicular joint. The left humeral head is seated within the glenoid fossa. No significant soft tissue abnormalities are seen. The visualized portions of the lungs are clear. Cervical spinal fusion hardware is noted. IMPRESSION: No evidence of acute fracture or dislocation. If the patient's symptoms persist, MRI could be considered to assess for underlying internal derangement. Electronically Signed   By: Roanna Raider M.D.   On: 07/26/2015 01:08   I have personally reviewed and evaluated these image results as part of my medical decision-making.  MDM   Final diagnoses:  Fall, initial encounter   Pt presents after slipping and falling on ice today with complaints of slip and fall on ice.  He denies any symptoms at this time.  He denies head injury, loss of consciousness, neck pain, or back pain.  He is well appearing and ambulatory.  His exam is completely benign. Cervical xray was ordered prior to my evaluation by triage.  X-Rays  are negative for obvious fracture or dislocation. He is asymptomatic presently and states he only came in to be checked out. Patient will be dc home & is agreeable with above plan.  I personally performed the services described in this documentation, which was scribed in my presence. The recorded information has been reviewed and is accurate.     Catha Gosselin, PA-C 07/26/15 2017  Lorre Nick, MD 07/29/15 [phone]

## 2015-07-26 NOTE — Discharge Instructions (Signed)

## 2015-07-26 NOTE — ED Notes (Signed)
Patient d/c'd self care.  F/U and medications discussed.  Patient verbalized understanding. 

## 2015-07-26 NOTE — ED Notes (Addendum)
Brought in by EMS home with c/o back pain after his fall tonight.  Pt reports that he was walking on ice when he slipped and fell.  Denies hitting head. States he landed on his upper back and left shoulder on ice.  Pt states left shoulder "is hurting so bad now".  Reports hx of rotator cuff surgery.

## 2015-07-26 NOTE — ED Notes (Signed)
[place] was [place] bus stop c/o ankle pain, fell on ice c/o R ankle pain.

## 2015-07-26 NOTE — ED Notes (Addendum)
Per EMS, pt complains of neck stiffness, left shoulder pain. Pt fell outside today while walking on ice. Pt fell on his back. Pt denies loss of consciousness. Pt has towel wrapped around neck, pt would not tolerate C-collar. [facility], states he fell again today.

## 2015-07-26 NOTE — ED Notes (Signed)
Unable to get temp at this time. °

## 2015-07-26 NOTE — Discharge Instructions (Signed)
Rotator Cuff Injury °Rotator cuff injury is any type of injury to the set of muscles and tendons that make up the stabilizing unit of your shoulder. This unit holds the ball of your upper arm bone (humerus) in the socket of your shoulder blade (scapula).  °CAUSES °Injuries to your rotator cuff most commonly come from sports or activities that cause your arm to be moved repeatedly over your head. Examples of this include throwing, weight lifting, swimming, or racquet sports. Long lasting (chronic) irritation of your rotator cuff can cause soreness and swelling (inflammation), bursitis, and eventual damage to your tendons, such as a tear (rupture). °SIGNS AND SYMPTOMS °Acute rotator cuff tear: °· Sudden tearing sensation followed by severe pain shooting from your upper shoulder down your arm toward your elbow. °· Decreased range of motion of your shoulder because of pain and muscle spasm. °· Severe pain. °· Inability to raise your arm out to the side because of pain and loss of muscle power (large tears). °Chronic rotator cuff tear: °· Pain that usually is worse at night and may interfere with sleep. °· Gradual weakness and decreased shoulder motion as the pain worsens. °· Decreased range of motion. °Rotator cuff tendinitis:  °· Deep ache in your shoulder and the outside upper arm over your shoulder. °· Pain that comes on gradually and becomes worse when lifting your arm to the side or turning it inward. °DIAGNOSIS °Rotator cuff injury is diagnosed through a medical history, physical exam, and imaging exam. The medical history helps determine the type of rotator cuff injury. Your health care provider will look at your injured shoulder, feel the injured area, and ask you to move your shoulder in different positions. X-ray exams typically are done to rule out other causes of shoulder pain, such as fractures. MRI is the exam of choice for the most severe shoulder injuries because the images show muscles and tendons.    °TREATMENT  °Chronic tear: °· Medicine for pain, such as acetaminophen or ibuprofen. °· Physical therapy and range-of-motion exercises may be helpful in maintaining shoulder function and strength. °· Steroid injections into your shoulder joint. °· Surgical repair of the rotator cuff if the injury does not heal with noninvasive treatment. °Acute tear: °· Anti-inflammatory medicines such as ibuprofen and naproxen to help reduce pain and swelling. °· A sling to help support your arm and rest your rotator cuff muscles. Long-term use of a sling is not advised. It may cause significant stiffening of the shoulder joint. °· Surgery may be considered within a few weeks, especially in younger, active people, to return the shoulder to full function. °· Indications for surgical treatment include the following: °¨ Age younger than 60 years. °¨ Rotator cuff tears that are complete. °¨ Physical therapy, rest, and anti-inflammatory medicines have been used for 6-8 weeks, with no improvement. °¨ Employment or sporting activity that requires constant shoulder use. °Tendinitis: °· Anti-inflammatory medicines such as ibuprofen and naproxen to help reduce pain and swelling. °· A sling to help support your arm and rest your rotator cuff muscles. Long-term use of a sling is not advised. It may cause significant stiffening of the shoulder joint. °· Severe tendinitis may require: °¨ Steroid injections into your shoulder joint. °¨ Physical therapy. °¨ Surgery. °HOME CARE INSTRUCTIONS  °· Apply ice to your injury: °¨ Put ice in a plastic bag. °¨ Place a towel between your skin and the bag. °¨ Leave the ice on for 20 minutes, 2-3 times a day. °· If you   have a shoulder immobilizer (sling and straps), wear it until told otherwise by your health care provider.  You may want to sleep on several pillows or in a recliner at night to lessen swelling and pain.  Only take over-the-counter or prescription medicines for pain, discomfort, or fever as  directed by your health care provider.  Do simple hand squeezing exercises with a soft rubber ball to decrease hand swelling. SEEK MEDICAL CARE IF:   Your shoulder pain increases, or new pain or numbness develops in your arm, hand, or fingers.  Your hand or fingers are colder than your other hand. SEEK IMMEDIATE MEDICAL CARE IF:   Your arm, hand, or fingers are numb or tingling.  Your arm, hand, or fingers are increasingly swollen and painful, or they turn white or blue. MAKE SURE YOU:  Understand these instructions.  Will watch your condition.  Will get help right away if you are not doing well or get worse.   This information is not intended to replace advice given to you by your health care provider. Make sure you discuss any questions you have with your health care provider.   Document Released: 07/01/2000 Document Revised: 07/09/2013 Document Reviewed: 02/13/2013 Elsevier Interactive Patient Education 2016 [employer]  Emergency Department Resource Guide 1) Find a Doctor and Pay Out of Pocket Although you won't have to find out who is covered by your insurance plan, it is a good idea to ask around and get recommendations. You will then need to call the office and see if the doctor you have chosen will accept you as a new patient and what types of options they offer for patients who are self-pay. Some doctors offer discounts or will set up payment plans for their patients who do not have insurance, but you will need to ask so you aren't surprised when you get to your appointment.  2) Contact Your Local Health Department Not all health departments have doctors that can see patients for sick visits, but many do, so it is worth a call to see if yours does. If you don't know where your local health department is, you can check in your phone book. The CDC also has a tool to help you locate your state's health department, and many state websites also have listings of all of their  local health departments.  3) Find a Walk-in Clinic If your illness is not likely to be very severe or complicated, you may want to try a walk in clinic. These are popping up all over the country in pharmacies, drugstores, and shopping centers. They're usually staffed by nurse practitioners or physician assistants that have been trained to treat common illnesses and complaints. They're usually fairly quick and inexpensive. However, if you have serious medical issues or chronic medical problems, these are probably not your best option.  No Primary Care Doctor: - Call Health Connect at  [phone] - they can help you locate a primary care doctor that  accepts your insurance, provides certain services, etc. - Physician Referral Service- [phone]  Chronic Pain Problems: Organization         Address  Phone   Notes  [facility]  [phone]) [phone] Patients need to be referred by their primary care doctor.   Medication Assistance: Organization         Address  Phone   Notes  [facility] [address] [place], [place] [address] [phone] --Must be a resident  of [place] -- Must have NO insurance coverage whatsoever (no Medicaid/ Medicare, etc.) -- The pt. MUST have a primary care doctor that directs their care regularly and follows them in the community   MedAssist  [phone]   [employer]  [phone]) [phone]    Agencies that provide inexpensive medical care: Organization         Address  Phone   Notes  [facility]  [phone]   [facility]    [phone]   [facility] [address] [place], [place] [address] [phone]) [phone]   [facility] [place] [address], [place] [phone]   Planned Parenthood    [phone]) [phone]   [facility]    [phone]   [facility]  [address], [place]  Phone:  [phone]) [phone], Fax:  [phone] Hours of Operation:  9 am - 6 pm, M-F.  Also accepts Medicaid/Medicare and self-pay.  [facility]  [address], [place] Phone: [phone]) [phone], Fax: [phone]. Hours of Operation:  8:30 am - 5:30 pm, M-F.  Also accepts Medicaid and self-pay.  [facility] [place] [address], [place] Phone: [phone]) [phone]   [facility] [address], [place], [place] [phone], Ext. [phone] Mondays & Thursdays: 7-9 AM.  First 15 patients are seen on a first come, first serve basis.    Medicaid-accepting [facility] Providers:  Organization         Address  Phone   Notes  [facility] [address], [place] [phone]) [phone] Also accepts self-pay patients.  [facility] [address], [place]  [phone]   [facility] [address], [place] [phone]   [facility] [address] [place] [address], [place] [phone]) [phone]   Renaye RakersVeita Bland [address], [place]   [phone]) [phone] Only accepts [place] patients after they have their name applied to their card.   Self-Pay (no insurance) in [facility]:  Organization         Address  Phone   Notes  Sickle Cell Patients, [facility] [address], [place] [phone]   [facility] [address], [place] [phone]   [facility] [place]  [address], [place] [phone]) [phone]   [facility]/Dr. Osei-Bonsu  [address] [place] [address], [place] or [id number], [place] [phone]) [phone] Phone number for both [place] and [place] locations is the same.  [facility] [address], [place] [phone]   [facility] [address] [place] [address], [place] or [address] [phone]) [phone]   [facility] [address], [place] [phone]) [phone], phone; [phone](336) [phone], fax Sees patients 1st and 3rd Saturday of every month.  Must not qualify for public or private insurance (i.e. Medicaid, Medicare, [place] Health Choice, Veterans' Benefits)  Household income should be no more than 200% of the poverty level The clinic cannot treat you if you are pregnant or think you are pregnant  Sexually transmitted diseases are not treated at the clinic.    Dental Care: Organization         Address  Phone  Notes  [facility] Department of [facility] [address] [place], [place] [phone] Accepts children up to age 37 who are enrolled in [place] or [place] Health Choice; pregnant women with a Medicaid card; and children who have applied for Medicaid or [place] Health Choice, but were declined, whose parents can pay a reduced fee at time of service.  [facility] [place] [facility] Department of [facility]  [address], [place] [phone] Accepts children up to age 66 who are enrolled in [place] or [place] Health Choice; pregnant women with a Medicaid card; and children who have applied for Medicaid or  Health Choice, but were declined, whose parents can pay a reduced fee at time of service.  [facility]  [address] [place], [place] [phone] Patients are seen by appointment only. Walk-ins are not accepted. [facility] [facility] will see patients 79 years of age and older. Monday - Tuesday (8am-5pm) Most Wednesdays (8:30-5pm) $30 per visit, cash only  [facility] Adult Dental Access PROGRAM  [address], [facility] [phone] Patients are seen by appointment only. Walk-ins are not accepted. [facility] will see patients 83 years of age and older. One Wednesday Evening (Monthly: Volunteer Based).  $30 per visit, cash only  [employer]  [phone] for  adults; Children under age 75, call Graduate Pediatric Dentistry at [phone]. Children aged 53-14, please call [phone] to request a pediatric application.  Dental services are provided in all areas of dental care including fillings, crowns and bridges, complete and partial dentures, implants, gum treatment, root canals, and extractions. Preventive care is also provided. Treatment is provided to both adults and children. Patients are selected via a lottery and there is often a waiting list.   [facility] [address], [place]  [phone] [email or web address]   [facility] [address] [place], [place] [phone], Ext. [phone] Second and Fourth Thursday of each month, opens at 6:30 AM; Clinic ends at 9 AM.  Patients are seen on a first-come first-served basis, and a limited number are seen during each clinic.   [facility]  [address] [place], [place] [phone]   Eligibility Requirements You must have lived in [place], [place], or [place] counties for at least the last three months.   You cannot be eligible for state or federal sponsored [place], including [employer], [place], or [employer].   You generally cannot be eligible for healthcare insurance through your employer.    How to apply: Eligibility screenings are held every Tuesday and Wednesday afternoon from 1:00 pm until 4:00 pm. You do not need an appointment for the interview!  [facility] [address], [place], [place] [phone]   [facility] Health Department  [phone]   [facility] Health Department  [phone]   [facility]  [phone]    Behavioral Health Resources in the Community: Intensive Outpatient Programs Organization         Address  Phone  Notes  [facility] [address], [place], [place] [phone]   [facility]  [address] [place], [place] [phone]   [facility] [address] [place], [place]  [phone]   [facility] Health [address],  [place], [place] [phone] or [phone]   Substance Abuse Resources Organization  Address  Phone  Notes  Al[facility] Drug Services  [phone]   [facility]  [phone]   [facility] [place]  [phone]   Floydene Flock  [phone]   Residential & Outpatient Substance Abuse Program  [phone]   Psychological Services Organization         Address  Phone  Notes  [facility]  [phone]   [facility]  [phone]   [facility] Mental Health [address] [place] [phone] or [phone]    Mobile Crisis Teams Organization         Address  Phone  Notes  [facility], Mobile Crisis Care Unit  [phone]   [facility]  [address] [place], [place] [phone]   Doristine Locks [address] [place] [phone]    Self-Help/Support Groups Organization         Address  Phone             Notes  [facility] [place] - variety of support groups  [phone]- [phone] Call for more information  Narcotics Anonymous (NA), [facility] [address], [employer] [place]  2 meetings at this location   Statistician         Address  Phone  Notes  [facility] [address],    [place]  [phone]   [facility]  [address], [place] [address], [place], [place] [phone]   [facility] [address] [place], [place] [phone] Admissions: 8am-3pm M-F  [facility] [address],    [place], [place] [phone]   [facility] [address] [place], [place], [place] [phone]   [facility] [address],  [place], [place] [phone]   [facility]  - Intensive Outpatient [address], [place], [place] [phone]   [facility] ([facility] [address]  [place], [place] [phone] or [phone]   [facility] ([facility]) [address], [place], [place] [phone] Accepts Medicaid  [facility] [address]  [place] [phone] Substance Abuse/Addiction Treatment   [facility] Organization         Address  Phone  Notes  [facility]  [phone]   Angie Fava, PhD [address] Knack [place], [place]   [phone] or [phone]   [facility] Behavioral   [address] [place], [place] [phone]   [facility] [address] [place], [place] [phone] Insurance/Medicaid/sponsorship through [facility] and Families [address]                                    [place], [place] [phone] Therapy/tele-psych/case  [facility] [address] [place], [place] [phone]    Dr. Lolly Mustache  [phone]   [facility] [place]  [employer] [facility] Dept. 1) [address] [place] 2) [address] [place] 3)  [address], [place] [phone]  [phone]   [facility] Child Abuse Hotline [phone] or [phone] (After Hours)

## 2015-07-27 ENCOUNTER — Other Ambulatory Visit: Payer: Self-pay

## 2015-07-27 ENCOUNTER — Emergency Department (HOSPITAL_COMMUNITY)
Admission: EM | Admit: 2015-07-27 | Discharge: 2015-07-28 | Disposition: A | Discharge status: Home / Self Care | Payer: Medicaid Other | Attending: Emergency Medicine | Admitting: Emergency Medicine

## 2015-07-27 ENCOUNTER — Encounter (HOSPITAL_COMMUNITY): Payer: Self-pay | Admitting: Emergency Medicine

## 2015-07-27 DIAGNOSIS — F1210 Cannabis abuse, uncomplicated: Secondary | ICD-10-CM | POA: Insufficient documentation

## 2015-07-27 DIAGNOSIS — Z008 Encounter for other general examination: Secondary | ICD-10-CM | POA: Diagnosis present

## 2015-07-27 DIAGNOSIS — F17210 Nicotine dependence, cigarettes, uncomplicated: Secondary | ICD-10-CM | POA: Insufficient documentation

## 2015-07-27 DIAGNOSIS — F419 Anxiety disorder, unspecified: Secondary | ICD-10-CM | POA: Insufficient documentation

## 2015-07-27 DIAGNOSIS — F312 Bipolar disorder, current episode manic severe with psychotic features: Secondary | ICD-10-CM | POA: Diagnosis not present

## 2015-07-27 DIAGNOSIS — R454 Irritability and anger: Secondary | ICD-10-CM | POA: Diagnosis not present

## 2015-07-27 DIAGNOSIS — Z79899 Other long term (current) drug therapy: Secondary | ICD-10-CM | POA: Diagnosis not present

## 2015-07-27 DIAGNOSIS — F1410 Cocaine abuse, uncomplicated: Secondary | ICD-10-CM | POA: Insufficient documentation

## 2015-07-27 DIAGNOSIS — F319 Bipolar disorder, unspecified: Secondary | ICD-10-CM | POA: Diagnosis present

## 2015-07-27 LAB — CBC WITH DIFFERENTIAL/PLATELET
BASOS ABS: 0 10*3/uL (ref 0.0–0.1)
BASOS PCT: 0 %
Eosinophils Absolute: 0.3 10*3/uL (ref 0.0–0.7)
Eosinophils Relative: 4 %
HCT: 45.5 % (ref 39.0–52.0)
HEMOGLOBIN: 14.8 g/dL (ref 13.0–17.0)
Lymphocytes Relative: 28 %
Lymphs Abs: 2.2 10*3/uL (ref 0.7–4.0)
MCH: 27.5 pg (ref 26.0–34.0)
MCHC: 32.5 g/dL (ref 30.0–36.0)
MCV: 84.6 fL (ref 78.0–100.0)
MONOS PCT: 7 %
Monocytes Absolute: 0.5 10*3/uL (ref 0.1–1.0)
NEUTROS ABS: 4.8 10*3/uL (ref 1.7–7.7)
NEUTROS PCT: 61 %
Platelets: 271 10*3/uL (ref 150–400)
RBC: 5.38 MIL/uL (ref 4.22–5.81)
RDW: 14.3 % (ref 11.5–15.5)
WBC: 7.8 10*3/uL (ref 4.0–10.5)

## 2015-07-27 LAB — RAPID URINE DRUG SCREEN, HOSP PERFORMED
Amphetamines: NOT DETECTED
BENZODIAZEPINES: NOT DETECTED
Barbiturates: NOT DETECTED
COCAINE: POSITIVE — AB
Opiates: NOT DETECTED
TETRAHYDROCANNABINOL: POSITIVE — AB

## 2015-07-27 LAB — COMPREHENSIVE METABOLIC PANEL
ALBUMIN: 3.9 g/dL (ref 3.5–5.0)
ALT: 35 U/L (ref 17–63)
AST: 44 U/L — AB (ref 15–41)
Alkaline Phosphatase: 81 U/L (ref 38–126)
Anion gap: 7 (ref 5–15)
BUN: 14 mg/dL (ref 6–20)
CHLORIDE: 109 mmol/L (ref 101–111)
CO2: 27 mmol/L (ref 22–32)
Calcium: 9.1 mg/dL (ref 8.9–10.3)
Creatinine, Ser: 1.06 mg/dL (ref 0.61–1.24)
GFR calc Af Amer: >60 mL/min (ref >60)
GFR calc non Af Amer: >60 mL/min (ref >60)
GLUCOSE: 151 mg/dL — AB (ref 65–99)
POTASSIUM: 5.2 mmol/L — AB (ref 3.5–5.1)
SODIUM: 143 mmol/L (ref 135–145)
TOTAL PROTEIN: 7.5 g/dL (ref 6.5–8.1)
Total Bilirubin: 0.6 mg/dL (ref 0.3–1.2)

## 2015-07-27 LAB — ETHANOL: Alcohol, Ethyl (B): <5 mg/dL (ref <5)

## 2015-07-27 MED ORDER — ZIPRASIDONE MESYLATE 20 MG IM SOLR
INTRAMUSCULAR | Status: AC
  Administered 2015-07-27: 20 mg via INTRAMUSCULAR
  Filled 2015-07-27: qty 20

## 2015-07-27 MED ORDER — ZIPRASIDONE MESYLATE 20 MG IM SOLR
20.0000 mg | Freq: Once | INTRAMUSCULAR | Status: AC
  Administered 2015-07-27: 20 mg via INTRAMUSCULAR

## 2015-07-27 MED ORDER — LORAZEPAM 2 MG/ML IJ SOLN
INTRAMUSCULAR | Status: AC
  Administered 2015-07-27: 2 mg via INTRAMUSCULAR
  Filled 2015-07-27: qty 1

## 2015-07-27 MED ORDER — LORAZEPAM 2 MG/ML IJ SOLN
2.0000 mg | Freq: Once | INTRAMUSCULAR | Status: AC
  Administered 2015-07-27: 2 mg via INTRAMUSCULAR

## 2015-07-27 MED ORDER — STERILE WATER FOR INJECTION IJ SOLN
INTRAMUSCULAR | Status: AC
  Administered 2015-07-27: 10 mL via INTRAMUSCULAR
  Filled 2015-07-27: qty 10

## 2015-07-27 NOTE — Progress Notes (Signed)
Patient listed as having Medicaid [place] without a pcp.  Pcp  listed on patient's insurance card [facility] [place].  [place] living [place].  Patient may need to call DSS to change Medicaid to [place].  EDCM went to speak to patient at bedside, however patient is sleeping.  EDCM did not awaken patient at this time.
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
## 2015-07-27 NOTE — ED Notes (Signed)
Patient refused to take off gown and stated he was ready to leave. Patient states "I just needs the doc to look at me and let me go, cause i'm ready to go" Patient was non cooperative.
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
## 2015-07-27 NOTE — ED Notes (Signed)
Pt sleeping at present, no distress noted,  Pt under IVC. Exhibiting erratic behavior.  Pt had Geodon and Ativan for agitation earlier in the day.  Monitoring for safety, Q 15 min checks in effect.

## 2015-07-28 ENCOUNTER — Emergency Department (HOSPITAL_COMMUNITY)
Admission: EM | Admit: 2015-07-28 | Discharge: 2015-07-28 | Discharge status: Against Medical Advice | Payer: Medicaid Other | Attending: Emergency Medicine | Admitting: Emergency Medicine

## 2015-07-28 ENCOUNTER — Encounter (HOSPITAL_COMMUNITY): Payer: Self-pay | Admitting: *Deleted

## 2015-07-28 ENCOUNTER — Emergency Department (HOSPITAL_COMMUNITY): Payer: Medicaid Other

## 2015-07-28 DIAGNOSIS — R064 Hyperventilation: Secondary | ICD-10-CM | POA: Insufficient documentation

## 2015-07-28 DIAGNOSIS — I10 Essential (primary) hypertension: Secondary | ICD-10-CM | POA: Insufficient documentation

## 2015-07-28 DIAGNOSIS — E119 Type 2 diabetes mellitus without complications: Secondary | ICD-10-CM | POA: Diagnosis not present

## 2015-07-28 DIAGNOSIS — F419 Anxiety disorder, unspecified: Secondary | ICD-10-CM | POA: Diagnosis not present

## 2015-07-28 DIAGNOSIS — F17210 Nicotine dependence, cigarettes, uncomplicated: Secondary | ICD-10-CM | POA: Diagnosis not present

## 2015-07-28 DIAGNOSIS — F319 Bipolar disorder, unspecified: Secondary | ICD-10-CM | POA: Diagnosis not present

## 2015-07-28 DIAGNOSIS — R079 Chest pain, unspecified: Secondary | ICD-10-CM | POA: Diagnosis present

## 2015-07-28 LAB — BASIC METABOLIC PANEL
Anion gap: 9 (ref 5–15)
BUN: 17 mg/dL (ref 6–20)
CHLORIDE: 106 mmol/L (ref 101–111)
CO2: 23 mmol/L (ref 22–32)
CREATININE: 0.93 mg/dL (ref 0.61–1.24)
Calcium: 9.4 mg/dL (ref 8.9–10.3)
GFR calc Af Amer: >60 mL/min (ref >60)
Glucose, Bld: 142 mg/dL — ABNORMAL HIGH (ref 65–99)
Potassium: 4.3 mmol/L (ref 3.5–5.1)
SODIUM: 138 mmol/L (ref 135–145)

## 2015-07-28 LAB — CBC
HCT: 43.4 % (ref 39.0–52.0)
Hemoglobin: 14.6 g/dL (ref 13.0–17.0)
MCH: 27.6 pg (ref 26.0–34.0)
MCHC: 33.6 g/dL (ref 30.0–36.0)
MCV: 82 fL (ref 78.0–100.0)
PLATELETS: 297 10*3/uL (ref 150–400)
RBC: 5.29 MIL/uL (ref 4.22–5.81)
RDW: 14 % (ref 11.5–15.5)
WBC: 8.2 10*3/uL (ref 4.0–10.5)

## 2015-07-28 LAB — I-STAT TROPONIN, ED: Troponin i, poc: 0.01 ng/mL (ref 0.00–0.08)

## 2015-07-28 MED ORDER — RISPERIDONE 1 MG PO TBDP
1.0000 mg | ORAL_TABLET | Freq: Two times a day (BID) | ORAL | Status: DC
  Filled 2015-07-28 (×2): qty 1

## 2015-07-28 MED ORDER — TRAZODONE HCL 50 MG PO TABS: 50.0000 mg | ORAL_TABLET | Freq: Every day | ORAL | Status: DC

## 2015-07-28 MED ORDER — RISPERIDONE 1 MG PO TBDP: 1.0000 mg | ORAL_TABLET | Freq: Two times a day (BID) | ORAL | Status: DC

## 2015-07-28 NOTE — ED Notes (Signed)
Bed: WTR6 Expected date:  Expected time:  Means of arrival:  Comments: anxiety 
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
## 2015-07-28 NOTE — ED Notes (Signed)
Pt discharged ambulatory.  Prescriptions and discharge instructions were reviewed.  All belongings were returned to patient.

## 2015-07-28 NOTE — ED Notes (Signed)
Called for pt again, no response 
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
## 2015-07-28 NOTE — ED Notes (Signed)
Pt said that he may leave before he gets called back to a room.

## 2015-07-28 NOTE — ED Notes (Signed)
Called for patient to move to room, no answer in lobby.

## 2015-07-28 NOTE — ED Notes (Signed)
Called patient a second time from the lobby, no answer.

## 2015-07-28 NOTE — ED Notes (Signed)
Pt called and no response 

## 2015-07-28 NOTE — [facility] Suicide Risk Assessment (Cosign Needed)
Suicide Risk Assessment  Discharge Assessment   [facility]   Demographic Factors:  Male, Low socioeconomic status, Living alone and Unemployed  Total Time spent with patient: 20 minutes  Musculoskeletal: Strength & Muscle Tone: within normal limits Gait & Station: normal Patient leans: N/A  Psychiatric Specialty Exam:     Blood pressure 129/71, pulse 80, temperature 98.6 F (37 C), temperature source Oral, resp. rate 18, SpO2 100 %.There is no weight on file to calculate BMI.  General Appearance: Casual  Eye Contact:: Good  Speech: Clear and Coherent  Volume: Increased  Mood: Euphoric  Affect: Congruent and happy singing.  Thought Process: Coherent, Goal Directed and Intact  Orientation: Full (Time, Place, and Person)  Thought Content: WDL  Suicidal Thoughts: No  Homicidal Thoughts: No  Memory: Immediate; Fair Recent; Fair Remote; Fair  Judgement: Good  Insight: Good  Psychomotor Activity: Normal  Concentration: Good  Recall: Fair  Fund of Knowledge:Good  Language: Good  Akathisia: No  Handed: Right  AIMS (if indicated):    Assets: Desire for Improvement  ADL's: Intact  Cognition: WNL            Has this patient used any form of tobacco in the last 30 days? (Cigarettes, Smokeless Tobacco, Cigars, and/or Pipes) Yes, A prescription for an FDA-approved tobacco cessation medication was offered at discharge and the patient refused  Mental Status Per Nursing Assessment::   On Admission:     Current Mental Status by Physician: NA  Loss Factors: NA  Historical Factors: NA  Risk Reduction Factors:   Positive social support  Continued Clinical Symptoms:  Bipolar Disorder:   Mixed State  Cognitive Features That Contribute To Risk:  Polarized thinking    Suicide Risk:  Minimal: No identifiable suicidal ideation.  Patients presenting with no risk factors but with morbid  ruminations; may be classified as minimal risk based on the severity of the depressive symptoms  Principal Problem: Bipolar 1 disorder, depressed [facility](HCC) Discharge Diagnoses:  Patient Active Problem List   Diagnosis Date Noted  . Bipolar 1 disorder, depressed (HCC) [F31.9] 06/06/2014    Priority: High      Plan Of Care/Follow-up recommendations:  Activity:  as tolerated Diet:  regular  Is patient on multiple antipsychotic therapies at discharge:  No   Has Patient had three or more failed trials of antipsychotic monotherapy by history:  No  Recommended Plan for Multiple Antipsychotic Therapies: NA    Earney NavyJosephine C Izear Pine   PMHNP-BC 07/28/2015, 11:48 AM

## 2015-07-29 ENCOUNTER — Emergency Department (HOSPITAL_COMMUNITY)
Admission: EM | Admit: 2015-07-29 | Discharge: 2015-07-29 | Disposition: A | Discharge status: Home / Self Care | Payer: Medicaid Other | Attending: Emergency Medicine | Admitting: Emergency Medicine

## 2015-07-29 ENCOUNTER — Encounter (HOSPITAL_COMMUNITY): Payer: Self-pay | Admitting: Emergency Medicine

## 2015-07-29 ENCOUNTER — Emergency Department (HOSPITAL_COMMUNITY)
Admission: EM | Admit: 2015-07-29 | Discharge: 2015-07-29 | Discharge status: Against Medical Advice | Payer: Medicaid Other | Source: Home / Self Care | Attending: Emergency Medicine | Admitting: Emergency Medicine

## 2015-07-29 DIAGNOSIS — I10 Essential (primary) hypertension: Secondary | ICD-10-CM | POA: Insufficient documentation

## 2015-07-29 DIAGNOSIS — F419 Anxiety disorder, unspecified: Secondary | ICD-10-CM | POA: Diagnosis present

## 2015-07-29 DIAGNOSIS — Y9289 Other specified places as the place of occurrence of the external cause: Secondary | ICD-10-CM | POA: Insufficient documentation

## 2015-07-29 DIAGNOSIS — Z79899 Other long term (current) drug therapy: Secondary | ICD-10-CM | POA: Diagnosis not present

## 2015-07-29 DIAGNOSIS — X58XXXA Exposure to other specified factors, initial encounter: Secondary | ICD-10-CM | POA: Insufficient documentation

## 2015-07-29 DIAGNOSIS — S39012A Strain of muscle, fascia and tendon of lower back, initial encounter: Secondary | ICD-10-CM | POA: Insufficient documentation

## 2015-07-29 DIAGNOSIS — S3992XA Unspecified injury of lower back, initial encounter: Secondary | ICD-10-CM | POA: Insufficient documentation

## 2015-07-29 DIAGNOSIS — Y998 Other external cause status: Secondary | ICD-10-CM | POA: Insufficient documentation

## 2015-07-29 DIAGNOSIS — M545 Low back pain, unspecified: Secondary | ICD-10-CM

## 2015-07-29 DIAGNOSIS — Y9389 Activity, other specified: Secondary | ICD-10-CM

## 2015-07-29 DIAGNOSIS — T148XXA Other injury of unspecified body region, initial encounter: Secondary | ICD-10-CM

## 2015-07-29 DIAGNOSIS — F17210 Nicotine dependence, cigarettes, uncomplicated: Secondary | ICD-10-CM | POA: Insufficient documentation

## 2015-07-29 DIAGNOSIS — E119 Type 2 diabetes mellitus without complications: Secondary | ICD-10-CM | POA: Diagnosis not present

## 2015-07-29 DIAGNOSIS — W000XXA Fall on same level due to ice and snow, initial encounter: Secondary | ICD-10-CM | POA: Insufficient documentation

## 2015-07-29 DIAGNOSIS — S199XXA Unspecified injury of neck, initial encounter: Secondary | ICD-10-CM | POA: Insufficient documentation

## 2015-07-29 MED ORDER — CYCLOBENZAPRINE HCL 10 MG PO TABS
5.0000 mg | ORAL_TABLET | Freq: Once | ORAL | Status: AC
  Administered 2015-07-29: 5 mg via ORAL
  Filled 2015-07-29: qty 1

## 2015-07-29 MED ORDER — CYCLOBENZAPRINE HCL 10 MG PO TABS: 10.0000 mg | ORAL_TABLET | Freq: Two times a day (BID) | ORAL | Status: DC | PRN

## 2015-07-29 MED ORDER — IBUPROFEN 800 MG PO TABS: 800.0000 mg | ORAL_TABLET | Freq: Three times a day (TID) | ORAL | Status: DC

## 2015-07-29 MED ORDER — IBUPROFEN 800 MG PO TABS
800.0000 mg | ORAL_TABLET | Freq: Once | ORAL | Status: AC
  Administered 2015-07-29: 800 mg via ORAL
  Filled 2015-07-29: qty 1

## 2015-07-29 NOTE — ED Notes (Signed)
Writer observed pt ambulating to the bathroom with no assistance. Pt has a steady gait.

## 2015-07-29 NOTE — Discharge Instructions (Signed)
Back Pain, Adult  Back pain is very common in adults.The cause of back pain is rarely dangerous and the pain often gets better over time.The cause of your back pain may not be known. Some common causes of back pain include:   Strain of the muscles or ligaments supporting the spine.   Wear and tear (degeneration) of the spinal disks.   Arthritis.   Direct injury to the back.  For many people, back pain may return. Since back pain is rarely dangerous, most people can learn to manage this condition on their own.  HOME CARE INSTRUCTIONS  Watch your back pain for any changes. The following actions may help to lessen any discomfort you are feeling:   Remain active. It is stressful on your back to sit or stand in one place for long periods of time. Do not sit, drive, or stand in one place for more than 30 minutes at a time. Take short walks on even surfaces as soon as you are able.Try to increase the length of time you walk each day.   Exercise regularly as directed by your health care provider. Exercise helps your back heal faster. It also helps avoid future injury by keeping your muscles strong and flexible.   Do not stay in bed.Resting more than 1-2 days can delay your recovery.   Pay attention to your body when you bend and lift. The most comfortable positions are those that put less stress on your recovering back. Always use proper lifting techniques, including:    Bending your knees.    Keeping the load close to your body.    Avoiding twisting.   Find a comfortable position to sleep. Use a firm mattress and lie on your side with your knees slightly bent. If you lie on your back, put a pillow under your knees.   Avoid feeling anxious or stressed.Stress increases muscle tension and can worsen back pain.It is important to recognize when you are anxious or stressed and learn ways to manage it, such as with exercise.   Take medicines only as directed by your health care provider. Over-the-counter  medicines to reduce pain and inflammation are often the most helpful.Your health care provider may prescribe muscle relaxant drugs.These medicines help dull your pain so you can more quickly return to your normal activities and healthy exercise.   Apply ice to the injured area:    Put ice in a plastic bag.    Place a towel between your skin and the bag.    Leave the ice on for 20 minutes, 2-3 times a day for the first 2-3 days. After that, ice and heat may be alternated to reduce pain and spasms.   Maintain a healthy weight. Excess weight puts extra stress on your back and makes it difficult to maintain good posture.  SEEK MEDICAL CARE IF:   You have pain that is not relieved with rest or medicine.   You have increasing pain going down into the legs or buttocks.   You have pain that does not improve in one week.   You have night pain.   You lose weight.   You have a fever or chills.  SEEK IMMEDIATE MEDICAL CARE IF:    You develop new bowel or bladder control problems.   You have unusual weakness or numbness in your arms or legs.   You develop nausea or vomiting.   You develop abdominal pain.   You feel faint.     This information   is not intended to replace advice given to you by your health care provider. Make sure you discuss any questions you have with your health care provider.     Document Released: 07/04/2005 Document Revised: 07/25/2014 Document Reviewed: 11/05/2013  Elsevier Interactive Patient Education 2016 [employer]  Muscle Strain  A muscle strain is an injury that occurs when a muscle is stretched beyond its normal length. Usually a small number of muscle fibers are torn when this happens. Muscle strain is rated in degrees. First-degree strains have the least amount of muscle fiber tearing and pain. Second-degree and third-degree strains have increasingly more tearing and pain.   Usually, recovery from muscle strain takes 1-2 weeks. Complete healing takes 5-6 weeks.   CAUSES   Muscle  strain happens when a sudden, violent force placed on a muscle stretches it too far. This may occur with lifting, sports, or a fall.   RISK FACTORS  Muscle strain is especially common in athletes.   SIGNS AND SYMPTOMS  At the site of the muscle strain, there may be:   Pain.   Bruising.   Swelling.   Difficulty using the muscle due to pain or lack of normal function.  DIAGNOSIS   Your health care provider will perform a physical exam and ask about your medical history.  TREATMENT   Often, the best treatment for a muscle strain is resting, icing, and applying cold compresses to the injured area.   HOME CARE INSTRUCTIONS    Use the PRICE method of treatment to promote muscle healing during the first 2-3 days after your injury. The PRICE method involves:    Protecting the muscle from being injured again.    Restricting your activity and resting the injured body part.    Icing your injury. To do this, put ice in a plastic bag. Place a towel between your skin and the bag. Then, apply the ice and leave it on from 15-20 minutes each hour. After the third day, switch to moist heat packs.    Apply compression to the injured area with a splint or elastic bandage. Be careful not to wrap it too tightly. This may interfere with blood circulation or increase swelling.    Elevate the injured body part above the level of your heart as often as you can.   Only take over-the-counter or prescription medicines for pain, discomfort, or fever as directed by your health care provider.   Warming up prior to exercise helps to prevent future muscle strains.  SEEK MEDICAL CARE IF:    You have increasing pain or swelling in the injured area.   You have numbness, tingling, or a significant loss of strength in the injured area.  MAKE SURE YOU:    Understand these instructions.   Will watch your condition.   Will get help right away if you are not doing well or get worse.     This information is not intended to replace advice given to  you by your health care provider. Make sure you discuss any questions you have with your health care provider.     Document Released: 07/04/2005 Document Revised: 04/24/2013 Document Reviewed: 01/31/2013  Elsevier Interactive Patient Education 2016 Elsevier Inc[employer]
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
## 2015-07-29 NOTE — ED Notes (Signed)
Pt states that he went to the gym today and tried to squat 300lbs and realized that he can no longer do that. Pt now c/o lower back pain. Alert and oriented.

## 2015-07-29 NOTE — ED Notes (Signed)
Pt was seen here multiple times w/n the past 48 hours, c/o now a fall along with neck and lower back pain. Pt states he slipped on ice.

## 2015-07-29 NOTE — ED Notes (Signed)
Bed: WA08 Expected date:  Expected time:  Means of arrival:  Comments: 

## 2015-07-29 NOTE — ED Notes (Signed)
Bed: [facility] Expected date:  Expected time:  Means of arrival:  Comments: ems 

## 2015-07-30 ENCOUNTER — Encounter (HOSPITAL_COMMUNITY): Payer: Self-pay | Admitting: Emergency Medicine

## 2015-07-30 ENCOUNTER — Emergency Department (HOSPITAL_COMMUNITY)
Admission: EM | Admit: 2015-07-30 | Discharge: 2015-07-30 | Discharge status: Against Medical Advice | Payer: Medicaid Other | Attending: Emergency Medicine | Admitting: Emergency Medicine

## 2015-07-30 ENCOUNTER — Emergency Department (HOSPITAL_COMMUNITY)
Admission: EM | Admit: 2015-07-30 | Discharge: 2015-07-31 | Discharge status: Against Medical Advice | Payer: Medicaid Other

## 2015-07-30 HISTORY — DX: Essential (primary) hypertension: I10

## 2015-07-30 NOTE — ED Notes (Signed)
Pt ambulated to restroom without difficulty

## 2015-07-30 NOTE — ED Notes (Signed)
Per [employer], Pt left the department and was seen leaving the campus.

## 2015-07-30 NOTE — ED Notes (Signed)
No answer in the lobby 

## 2015-07-30 NOTE — ED Notes (Signed)
Pt heard loudly grunting, writer knocked on door and and upon opening door, pt found to have pants around knees masturbating. Pt was given option to stay in room out of convenience, pt ask to collect belongings now spread all over the room and have a seat in the WR to wait for room to be seen.

## 2015-07-30 NOTE — ED Notes (Signed)
Patient is from home. Per EMS, patient called EMS for needing oxygen. Pt was very agitated and cussing at EMS. Pt is alert, oriented x 4. He was demanding to go to [place] but changed his mind to come to [facility].

## 2015-07-30 NOTE — ED Notes (Signed)
Upon calling pt to triage; pt is not in the lobby and does not answer when name called

## 2015-07-30 NOTE — ED Notes (Signed)
Pt arrives to the ER via EMS for complaints of back pain; pt laughing and cutting up in the wheelchair; pt c/o intermittent back pain; pt states that he fell earlier today

## 2015-07-31 ENCOUNTER — Emergency Department (HOSPITAL_COMMUNITY)
Admission: EM | Admit: 2015-07-31 | Discharge: 2015-07-31 | Discharge status: Against Medical Advice | Payer: Medicaid Other

## 2015-07-31 NOTE — ED Notes (Signed)
No answer when pt called to triage

## 2015-07-31 NOTE — ED Notes (Signed)
Pt did not answer when called to triage

## 2015-07-31 NOTE — ED Notes (Signed)
Pt called x2 no response

## 2015-07-31 NOTE — ED Notes (Signed)
No answer when pt's name called in lobby for triage

## 2015-07-31 NOTE — ED Notes (Signed)
No answer when pt called in the waiting room for triage

## 2015-07-31 NOTE — ED Notes (Signed)
No answer from waiting room.

## 2015-08-01 ENCOUNTER — Encounter (HOSPITAL_COMMUNITY): Payer: Self-pay

## 2015-08-01 ENCOUNTER — Emergency Department (HOSPITAL_COMMUNITY)
Admission: EM | Admit: 2015-08-01 | Discharge: 2015-08-03 | Disposition: A | Discharge status: Home / Self Care | Payer: Medicaid Other | Attending: Emergency Medicine | Admitting: Emergency Medicine

## 2015-08-01 DIAGNOSIS — Z79899 Other long term (current) drug therapy: Secondary | ICD-10-CM | POA: Insufficient documentation

## 2015-08-01 DIAGNOSIS — Z008 Encounter for other general examination: Secondary | ICD-10-CM | POA: Diagnosis present

## 2015-08-01 DIAGNOSIS — E119 Type 2 diabetes mellitus without complications: Secondary | ICD-10-CM | POA: Insufficient documentation

## 2015-08-01 DIAGNOSIS — F3130 Bipolar disorder, current episode depressed, mild or moderate severity, unspecified: Secondary | ICD-10-CM | POA: Diagnosis not present

## 2015-08-01 DIAGNOSIS — Z87828 Personal history of other (healed) physical injury and trauma: Secondary | ICD-10-CM | POA: Insufficient documentation

## 2015-08-01 DIAGNOSIS — F17210 Nicotine dependence, cigarettes, uncomplicated: Secondary | ICD-10-CM | POA: Diagnosis not present

## 2015-08-01 DIAGNOSIS — F319 Bipolar disorder, unspecified: Secondary | ICD-10-CM

## 2015-08-01 DIAGNOSIS — I10 Essential (primary) hypertension: Secondary | ICD-10-CM | POA: Diagnosis not present

## 2015-08-01 DIAGNOSIS — F309 Manic episode, unspecified: Secondary | ICD-10-CM | POA: Diagnosis not present

## 2015-08-01 LAB — CBC WITH DIFFERENTIAL/PLATELET
BASOS ABS: 0 10*3/uL (ref 0.0–0.1)
BASOS PCT: 0 %
EOS ABS: 0.4 10*3/uL (ref 0.0–0.7)
Eosinophils Relative: 4 %
HEMATOCRIT: 43.3 % (ref 39.0–52.0)
Hemoglobin: 14.5 g/dL (ref 13.0–17.0)
Lymphocytes Relative: 24 %
Lymphs Abs: 2.4 10*3/uL (ref 0.7–4.0)
MCH: 27.7 pg (ref 26.0–34.0)
MCHC: 33.5 g/dL (ref 30.0–36.0)
MCV: 82.8 fL (ref 78.0–100.0)
MONO ABS: 0.8 10*3/uL (ref 0.1–1.0)
MONOS PCT: 8 %
NEUTROS ABS: 6.3 10*3/uL (ref 1.7–7.7)
NEUTROS PCT: 64 %
Platelets: 298 10*3/uL (ref 150–400)
RBC: 5.23 MIL/uL (ref 4.22–5.81)
RDW: 14.3 % (ref 11.5–15.5)
WBC: 9.9 10*3/uL (ref 4.0–10.5)

## 2015-08-01 LAB — COMPREHENSIVE METABOLIC PANEL
ALBUMIN: 3.7 g/dL (ref 3.5–5.0)
ALT: 40 U/L (ref 17–63)
ANION GAP: 10 (ref 5–15)
AST: 51 U/L — AB (ref 15–41)
Alkaline Phosphatase: 108 U/L (ref 38–126)
BILIRUBIN TOTAL: 0.4 mg/dL (ref 0.3–1.2)
BUN: 8 mg/dL (ref 6–20)
CO2: 21 mmol/L — ABNORMAL LOW (ref 22–32)
Calcium: 9 mg/dL (ref 8.9–10.3)
Chloride: 108 mmol/L (ref 101–111)
Creatinine, Ser: 0.96 mg/dL (ref 0.61–1.24)
GFR calc Af Amer: >60 mL/min (ref >60)
GFR calc non Af Amer: >60 mL/min (ref >60)
GLUCOSE: 334 mg/dL — AB (ref 65–99)
POTASSIUM: 4.1 mmol/L (ref 3.5–5.1)
SODIUM: 139 mmol/L (ref 135–145)
TOTAL PROTEIN: 7.1 g/dL (ref 6.5–8.1)

## 2015-08-01 LAB — RAPID URINE DRUG SCREEN, HOSP PERFORMED
Amphetamines: NOT DETECTED
BARBITURATES: NOT DETECTED
BENZODIAZEPINES: NOT DETECTED
Cocaine: NOT DETECTED
Opiates: NOT DETECTED
Tetrahydrocannabinol: NOT DETECTED

## 2015-08-01 LAB — ETHANOL: Alcohol, Ethyl (B): <5 mg/dL (ref <5)

## 2015-08-01 LAB — ACETAMINOPHEN LEVEL

## 2015-08-01 LAB — SALICYLATE LEVEL: Salicylate Lvl: <4 mg/dL (ref 2.8–30.0)

## 2015-08-01 MED ORDER — NICOTINE 21 MG/24HR TD PT24: 21.0000 mg | MEDICATED_PATCH | Freq: Every day | TRANSDERMAL | Status: DC

## 2015-08-01 MED ORDER — LORAZEPAM 2 MG/ML IJ SOLN
2.0000 mg | Freq: Once | INTRAMUSCULAR | Status: AC
  Administered 2015-08-01: 2 mg via INTRAMUSCULAR
  Filled 2015-08-01: qty 1

## 2015-08-01 MED ORDER — TRAZODONE HCL 50 MG PO TABS: 50.0000 mg | ORAL_TABLET | Freq: Every day | ORAL | Status: DC

## 2015-08-01 MED ORDER — ZIPRASIDONE MESYLATE 20 MG IM SOLR
10.0000 mg | Freq: Once | INTRAMUSCULAR | Status: AC
  Administered 2015-08-01: 10 mg via INTRAMUSCULAR
  Filled 2015-08-01: qty 20

## 2015-08-01 MED ORDER — IBUPROFEN 200 MG PO TABS
600.0000 mg | ORAL_TABLET | Freq: Three times a day (TID) | ORAL | Status: DC | PRN
  Administered 2015-08-02: 600 mg via ORAL
  Filled 2015-08-01: qty 3

## 2015-08-01 MED ORDER — LORAZEPAM 1 MG PO TABS
1.0000 mg | ORAL_TABLET | Freq: Three times a day (TID) | ORAL | Status: DC | PRN
  Administered 2015-08-02: 1 mg via ORAL
  Filled 2015-08-01: qty 1

## 2015-08-01 MED ORDER — ONDANSETRON HCL 4 MG PO TABS: 4.0000 mg | ORAL_TABLET | Freq: Three times a day (TID) | ORAL | Status: DC | PRN

## 2015-08-01 MED ORDER — ACETAMINOPHEN 325 MG PO TABS: 650.0000 mg | ORAL_TABLET | ORAL | Status: DC | PRN

## 2015-08-01 MED ORDER — STERILE WATER FOR INJECTION IJ SOLN
INTRAMUSCULAR | Status: AC
  Filled 2015-08-01: qty 10

## 2015-08-01 MED ORDER — RISPERIDONE 1 MG PO TBDP
1.0000 mg | ORAL_TABLET | Freq: Two times a day (BID) | ORAL | Status: DC
  Administered 2015-08-02: 1 mg via ORAL
  Filled 2015-08-01 (×4): qty 1

## 2015-08-01 MED ORDER — ALUM & MAG HYDROXIDE-SIMETH 200-200-20 MG/5ML PO SUSP: 30.0000 mL | ORAL | Status: DC | PRN

## 2015-08-01 MED ORDER — ADULT MULTIVITAMIN W/MINERALS CH
1.0000 | ORAL_TABLET | Freq: Every day | ORAL | Status: DC
  Administered 2015-08-02: 1 via ORAL
  Filled 2015-08-01: qty 1

## 2015-08-01 NOTE — ED Provider Notes (Addendum)
CSN: [id number]     Arrival date & time 08/01/15  0737 History   First MD Initiated Contact with Patient 08/01/15 0750     Chief Complaint  Patient presents with  . Annual Exam     (Consider location/radiation/quality/duration/timing/severity/associated sxs/prior Treatment) HPI 55 year old male who presents for general physical. History of bipolar disorder, HTN, and DM. States he wanted to be medically cleared to go to a [place] Cruise rap convention this weekend. States he was recently incarcerated over past 3 months, but released recently states compliance with medications and over past few days he has felt the best he has had in his life. Denies any complaints. No fever, chills, N/V, chest pain, sob, cough, abd pain, diarrhea, or urinary complaints. No suicidal thoughts or homicidal thoughts.  Past Medical History  Diagnosis Date  . Lumbar spinal cord injury (HCC) 2012  . Bipolar disorder (HCC)   . Depression   . Hypertension   . Diabetes mellitus without complication [facility])    Past Surgical History  Procedure Laterality Date  . Back surgery    . Eye surgery     No family history on file. Social History  Substance Use Topics  . Smoking status: Current Every Day Smoker    Types: Cigarettes  . Smokeless tobacco: None  . Alcohol Use: No     Comment: occ, denies recent injestion    Review of Systems 10/14 systems reviewed and are negative other than those stated in the HPI   Allergies  Haldol  Home Medications   Prior to Admission medications   Medication Sig Start Date End Date Taking? Authorizing Provider  Multiple Vitamin (MULTIVITAMIN WITH MINERALS) TABS tablet Take 1 tablet by mouth daily.   Yes Historical Provider, MD  risperiDONE (RISPERDAL M-TABS) 1 MG disintegrating tablet Take 1 tablet (1 mg total) by mouth 2 (two) times daily. 07/28/15  Yes Earney Navy, NP  traZODone (DESYREL) 50 MG tablet Take 1 tablet (50 mg total) by mouth at bedtime. 07/28/15  Yes Earney Navy, NP  cyclobenzaprine (FLEXERIL) 10 MG tablet Take 1 tablet (10 mg total) by mouth 2 (two) times daily as needed for muscle spasms. Patient not taking: Reported on 07/30/2015 07/29/15   Elpidio Anis, PA-C  ibuprofen (ADVIL,MOTRIN) 800 MG tablet Take 1 tablet (800 mg total) by mouth 3 (three) times daily. Patient not taking: Reported on 07/30/2015 07/29/15   Elpidio Anis, PA-C  meloxicam (MOBIC) 7.5 MG tablet Take 2 tablets (15 mg total) by mouth daily. Patient not taking: Reported on 07/27/2015 07/26/15   Antony Madura, PA-C   BP 143/100 mmHg  Pulse 98  Temp(Src) 98.1 F (36.7 C) (Oral)  Resp 22  SpO2 100% Physical Exam Physical Exam  Nursing note and vitals reviewed. Constitutional: Well developed, well nourished, non-toxic, and in no acute distress Head: Normocephalic and atraumatic.  Mouth/Throat: Oropharynx is clear and moist.  Neck: Normal range of motion. Neck supple.  Cardiovascular: Normal rate and regular rhythm.   Pulmonary/Chest: Effort normal and breath sounds normal.  Abdominal: Soft. There is no tenderness. There is no rebound and no guarding.  Musculoskeletal: Normal range of motion.  Neurological: Alert, no facial droop, fluent speech, moves all extremities symmetrically Skin: Skin is warm and dry.  Psychiatric:Rapid pressured speech with tangential thoughts, no suicide or homicidal thoughts  ED Course  Procedures (including critical care time) Labs Review Labs Reviewed  COMPREHENSIVE METABOLIC PANEL - Abnormal; Notable for the following:    CO2 21 (*)  Glucose, Bld 334 (*)    AST 51 (*)    All other components within normal limits  ACETAMINOPHEN LEVEL - Abnormal; Notable for the following:    Acetaminophen (Tylenol), Serum <10 (*)    All other components within normal limits  CBC WITH DIFFERENTIAL/PLATELET  ETHANOL  URINE RAPID DRUG SCREEN, HOSP PERFORMED  SALICYLATE LEVEL    Imaging Review No results found. I have personally reviewed and evaluated  these images and lab results as part of my medical decision-making.   EKG Interpretation None      MDM   Final diagnoses:  Mania (HCC)    Concerned for manic episode. He is very hyperactive and hyperverbal. Rapid/Pressured and tangential speech and very difficult to redirect. In middle of my evaluation breaks out [place] song and [place] how much [place] the town. Medically cleared and no medical complaints currently. Initially discussed with TTS, who contacted ACT team to see if this is his baseline. ACT team unable to assess patient in ED. IVC filed against patient given concern for manic episode. During ED course, becomes very agitated and verbally aggressive. Psych hold placed with home medications ordered.   Lavera Guiseana Duo Sindia Kowalczyk, MD 08/01/15 1657  Lavera Guiseana Duo Charleene Callegari, MD 08/01/15 [phone]

## 2015-08-01 NOTE — BH Assessment (Signed)
Yancey FlemingsSharon McKinney, patients nurse states that she will contact ACT Supervisor and wait on a call back. Jasmine DecemberSharon called back and states that the ACT Team will not come to see the patient. She states that the ACT Team is not involved in medical decisions. This Clinical research associatewriter explained to Jasmine DecemberSharon that the hospital has addressed medical concerns and is concerned about the patient being at his baseline. She states if the patient is not saying he is hurting himself for anyone else she is not sure why he would stay. She states that the crisis line is for a crisis and the patient is in an emergency room in a "safe place" and should make the medical decisions.  This Clinical research associatewriter informed her that the patient is Manic although cooperative at time of assessment and denies SI/HI, however, the ACT Team usually comes to assess the patients baseline when mania is present. Jasmine DecemberSharon states that the ACT Team does not assess patients in the hospital.   Consulted with [name], DNP and the EDP. Patient packed his things and started to walk down the hall stating "[name] is leaving the building." patient was speaking tangentially and EDP decided to place patient under emergency certificate which was notarized and placed in patients chart at that time. Patients first examination completed as well and placed in patients chart.    [name], LCSW Therapeutic Triage Specialist [facility] 08/01/2015 2:06 PM

## 2015-08-01 NOTE — ED Notes (Signed)
Dr. Verdie MosherLiu is seeing him as I write this.

## 2015-08-01 NOTE — ED Notes (Signed)
Pt at desk punching phone buttons, Writer told him we have a room for him to wait in. Pt become loud, "You're just prejudice" Started to stomp around and throw things outside of room. Security notified to be on stand by, Dr Joni FearsLui up to speak with pt. Pt agreed to wait.

## 2015-08-01 NOTE — ED Notes (Signed)
Patient asleep. No s/s of distress noted this shift. Respirations regular and unlabored. Skin warm and dry.

## 2015-08-01 NOTE — BH Assessment (Signed)
Assessment competed. Consulted with [name], DNP who recommends contacting the patients ACTT.  Contacted [facility] On Call Crisis Line at [phone] and there was no answer. Left voicemail with request to call back at [phone] in reference to a patient on the ACTT.   Davina PokeJoVea Elizaveta Mattice, LCSW Therapeutic Triage Specialist [facility] 08/01/2015 11:47 AM

## 2015-08-01 NOTE — ED Notes (Signed)
Patient initially shouting that he has to leave, but when given food began to devour it like he had not eaten recently.  Patient reports he did not take his Risperdol today.  When nurse asked if he regularly takes it he replied yes.  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

## 2015-08-01 NOTE — ED Notes (Signed)
Pt's belongings are in locker 30 and 31.  Gilmer MorCane is at [employer]urse's station.
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
## 2015-08-01 NOTE — ED Notes (Signed)
He abruptly announces he must "go out and smoke".  We adjure him to stay, however he proceeds to our lobby stating he "will be back".

## 2015-08-01 NOTE — ED Notes (Signed)
Patient resisted injections.  Officers had to assist in holding patient to give medications.  Patient changed into scrubs and is now lying in bed.

## 2015-08-01 NOTE — ED Notes (Signed)
[facility].  Patient sedated.  Full assist to stretcher and in room.

## 2015-08-01 NOTE — ED Notes (Signed)
He [place].  He states he is here for "a doctor to see me and make sure I'm healthy".  He is alert and oriented x 4 with clear speech and has no other complaints.

## 2015-08-01 NOTE — ED Notes (Signed)
Informed EDP of 1021 CBG.  No new orders given.

## 2015-08-02 DIAGNOSIS — F319 Bipolar disorder, unspecified: Secondary | ICD-10-CM | POA: Diagnosis not present

## 2015-08-02 DIAGNOSIS — F309 Manic episode, unspecified: Secondary | ICD-10-CM

## 2015-08-02 LAB — COMPREHENSIVE METABOLIC PANEL
ALBUMIN: 3.2 g/dL — AB (ref 3.5–5.0)
ALK PHOS: 87 U/L (ref 38–126)
ALT: 36 U/L (ref 17–63)
ANION GAP: 10 (ref 5–15)
AST: 36 U/L (ref 15–41)
BUN: 13 mg/dL (ref 6–20)
CALCIUM: 9.5 mg/dL (ref 8.9–10.3)
CHLORIDE: 108 mmol/L (ref 101–111)
CO2: 23 mmol/L (ref 22–32)
Creatinine, Ser: 0.88 mg/dL (ref 0.61–1.24)
GFR calc non Af Amer: >60 mL/min (ref >60)
GLUCOSE: 262 mg/dL — AB (ref 65–99)
POTASSIUM: 4 mmol/L (ref 3.5–5.1)
SODIUM: 141 mmol/L (ref 135–145)
Total Bilirubin: 0.4 mg/dL (ref 0.3–1.2)
Total Protein: 6.6 g/dL (ref 6.5–8.1)

## 2015-08-02 LAB — CBG MONITORING, ED
GLUCOSE-CAPILLARY: 161 mg/dL — AB (ref 65–99)
Glucose-Capillary: 405 mg/dL — ABNORMAL HIGH (ref 65–99)

## 2015-08-02 MED ORDER — ZIPRASIDONE HCL 20 MG PO CAPS
40.0000 mg | ORAL_CAPSULE | Freq: Two times a day (BID) | ORAL | Status: DC
  Administered 2015-08-02 – 2015-08-03 (×2): 40 mg via ORAL
  Filled 2015-08-02 (×2): qty 2

## 2015-08-02 MED ORDER — TRAZODONE HCL 100 MG PO TABS
100.0000 mg | ORAL_TABLET | Freq: Every day | ORAL | Status: DC
  Administered 2015-08-02: 100 mg via ORAL
  Filled 2015-08-02: qty 1

## 2015-08-02 MED ORDER — LORAZEPAM 2 MG/ML IJ SOLN
INTRAMUSCULAR | Status: AC
  Administered 2015-08-02: 2 mg via INTRAMUSCULAR
  Filled 2015-08-02: qty 1

## 2015-08-02 MED ORDER — LORAZEPAM 1 MG PO TABS: 1.0000 mg | ORAL_TABLET | ORAL | Status: DC | PRN

## 2015-08-02 MED ORDER — METFORMIN HCL 500 MG PO TABS
500.0000 mg | ORAL_TABLET | Freq: Two times a day (BID) | ORAL | Status: DC
  Administered 2015-08-03: 500 mg via ORAL
  Filled 2015-08-02 (×3): qty 1

## 2015-08-02 MED ORDER — TRIHEXYPHENIDYL HCL 2 MG PO TABS
2.0000 mg | ORAL_TABLET | Freq: Two times a day (BID) | ORAL | Status: DC
  Administered 2015-08-02 – 2015-08-03 (×2): 2 mg via ORAL
  Filled 2015-08-02 (×4): qty 1

## 2015-08-02 MED ORDER — METFORMIN HCL 500 MG PO TABS
500.0000 mg | ORAL_TABLET | Freq: Every day | ORAL | Status: DC
  Administered 2015-08-02: 500 mg via ORAL
  Filled 2015-08-02 (×3): qty 1

## 2015-08-02 MED ORDER — ZIPRASIDONE MESYLATE 20 MG IM SOLR
20.0000 mg | INTRAMUSCULAR | Status: AC | PRN
  Administered 2015-08-02: 20 mg via INTRAMUSCULAR
  Filled 2015-08-02: qty 20

## 2015-08-02 MED ORDER — OLANZAPINE 10 MG PO TBDP
10.0000 mg | ORAL_TABLET | Freq: Three times a day (TID) | ORAL | Status: DC | PRN
  Administered 2015-08-02: 10 mg via ORAL
  Filled 2015-08-02: qty 1

## 2015-08-02 MED ORDER — OLANZAPINE 10 MG IM SOLR
10.0000 mg | Freq: Once | INTRAMUSCULAR | Status: AC
  Administered 2015-08-02: 10 mg via INTRAMUSCULAR
  Filled 2015-08-02: qty 10

## 2015-08-02 MED ORDER — LORAZEPAM 2 MG/ML IJ SOLN: 1.0000 mg | Freq: Once | INTRAMUSCULAR | Status: AC

## 2015-08-02 MED ORDER — SODIUM CHLORIDE 0.9 % IV BOLUS (SEPSIS)
1000.0000 mL | Freq: Once | INTRAVENOUS | Status: AC
  Administered 2015-08-02: 1000 mL via INTRAVENOUS

## 2015-08-02 NOTE — ED Notes (Signed)
Pt. States he is unable to get up and walk to restroom without assistance to urinate.

## 2015-08-02 NOTE — ED Notes (Signed)
Patient given medications as ordered. [employer] and security in room to accompany staff. Pt took medications without incident. No s/s of distress noted at this time.

## 2015-08-02 NOTE — ED Notes (Signed)
Pt. Noted in room with sitter. No complaints or concerns voiced. No distress or abnormal behavior noted. Will continue to monitor with security cameras. Q 15 minute rounds continue.

## 2015-08-02 NOTE — ED Notes (Signed)
Pt belongings moved top locker #30, cane at nurses station

## 2015-08-02 NOTE — ED Notes (Signed)
Patient awake, at the nursing station cursing and threatening staff members as he is demanding more food and drinks. Pt informed that snacks not given after midnight. Pt had been given several snacks, drinks and sandwiches at bedtime, which he ate. Pt currently yelling racial slurs at RN yelling at the top of his lungs "You ain't nothing but a fat white motherfucking fat nurse. Fuck all ya'll fuckers. I have more money than all you whores! I got trucks and a million dollar company! Ya'll don't know who the fuck you are fucking with bitches!".

## 2015-08-02 NOTE — ED Notes (Signed)
Patient in and out of his room multiple times, yelling loudly everytime he yawns. Staff instructed pt not to yell, but pt attention-seeking and continues to do so. Pt pulling code button and laughing as he walks out of his room. Pt remains labile with any redirection.

## 2015-08-02 NOTE — ED Notes (Signed)
Report received from Lizbeth Bark RN. Pt. Sleeping, respirations regular and unlabored. Sitter at bedside. Will continue to monitor for safety via security cameras and Q 15 minute checks.

## 2015-08-02 NOTE — ED Notes (Signed)
Patient attempted to go to [facility].  Slumped to floor.  Called for assist.  Security/[employer] in for full assist.  Incontinent of urine.  Speech is slurred.  Screaming constantly for staff. Informed [facility] and [facility] charge nurse. Requested transfer to TCU with 1:1 sitter.

## 2015-08-02 NOTE — ED Notes (Signed)
Pt. C/o anxiety and insomnia. 

## 2015-08-02 NOTE — ED Notes (Signed)
[facility] Expected date:  Expected time:  Means of arrival:  Comments: rrom 30
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
## 2015-08-02 NOTE — Consult Note (Signed)
[facility] Consult   Reason for Consult:  [place] Referring Physician:  EDP Patient Identification: Charles Leon MRN:  [id number] Principal Diagnosis: Bipolar 1 disorder, depressed ([place]) Diagnosis:   Patient Active Problem List   Diagnosis Date Noted  . Bipolar 1 disorder, depressed ([place]) [F31.9] 06/06/2014    Priority: High    Total Time spent with patient: 45 minutes  Subjective:   Charles Leon is a 55 y.o. male patient does not warrant admission, release when medically stable.  HPI:  55 yo male who presented in a hypomanic state yesterday.  He is well known to the psychiatrist and is at his baseline.  No suicidal/homicidal ideations, hallucinations, and alcohol/drug abuse.  His ACT team was contacted but would not come and visit the patient yesterday.  He became agitated last night and required PRN medications which effected his blood glucose levels.  Today, he is at his baseline and calm/coopertive.  Once he is medically stable, he can discharge.  Past Psychiatric History: Bipolar disorder  Risk to Self: Suicidal Ideation: No Suicidal Intent: No Is patient at risk for suicide?: No Suicidal Plan?: No Access to Means: No What has been your use of drugs/alcohol within the last 12 months?: Denies How many times?: 1 (overdose on Haldol 1981) Other Self Harm Risks: Denies Triggers for Past Attempts: Unknown Intentional Self Injurious Behavior: None Risk to Others: Homicidal Ideation: No Thoughts of Harm to Others: No Comment - Thoughts of Harm to Others: Denies Current Homicidal Intent: No Current Homicidal Plan: No Access to Homicidal Means: No Identified Victim: Denies History of harm to others?: No Assessment of Violence: None Noted Violent Behavior Description: Denies Does patient have access to weapons?: No Criminal Charges Pending?: Yes Describe Pending Criminal Charges: took three cars to [place] Does patient have a court date: Yes Court Date:  09/22/15 Prior Inpatient Therapy: Prior Inpatient Therapy: Yes Prior Therapy Dates: November Prior Therapy Facilty/Provider(s): [facility] Observation Reason for Treatment: SI Prior Outpatient Therapy: Prior Outpatient Therapy: Yes Prior Therapy Dates: 8 months Prior Therapy Facilty/Provider(s): [facility] (Ray Cox ) Reason for Treatment: ACTT Does patient have an ACCT team?: Yes Does patient have Intensive In-House Services?  : No Does patient have [facility] services? : No Does patient have P4CC services?: No  Past Medical History:  Past Medical History  Diagnosis Date  . Lumbar spinal cord injury ([place]) 2012  . Bipolar disorder ([place])   . Depression   . Hypertension   . Diabetes mellitus without complication [facility])     Past Surgical History  Procedure Laterality Date  . Back surgery    . Eye surgery     Family History: No family history on file. Family Psychiatric  History: None Social History:  History  Alcohol Use No    Comment: occ, denies recent injestion     History  Drug Use No    Comment: Hx of crack/cocaine, THC denies 06/12/2015    Social History   Social History  . Marital Status: Single    Spouse Name: N/A  . Number of Children: N/A  . Years of Education: N/A   Social History Main Topics  . Smoking status: Current Every Day Smoker    Types: Cigarettes  . Smokeless tobacco: None  . Alcohol Use: No     Comment: occ, denies recent injestion  . Drug Use: No     Comment: Hx of crack/cocaine, THC denies 06/12/2015  . Sexual Activity: Not Asked   Other Topics Concern  .  None   Social History Narrative   Additional Social History:    Pain Medications: See PTA Prescriptions: See PTA Over the Counter: See PTA History of alcohol / drug use?: Yes Name of Substance 1: Heroin 1 - Age of First Use: 15 1 - Last Use / Amount: 30 years ago Name of Substance 2: Crack/Cocaine 2 - Last Use / Amount: 6 years ago                 Allergies:    Allergies  Allergen Reactions  . Haldol [Haloperidol] Other (See Comments)    Stiff neck    Labs:  Results for orders placed or performed during the hospital encounter of 08/01/15 (from the past 48 hour(s))  Urine rapid drug screen (hosp performed)     Status: None   Collection Time: 08/01/15  8:46 AM  Result Value Ref Range   Opiates NONE DETECTED NONE DETECTED   Cocaine NONE DETECTED NONE DETECTED   Benzodiazepines NONE DETECTED NONE DETECTED   Amphetamines NONE DETECTED NONE DETECTED   Tetrahydrocannabinol NONE DETECTED NONE DETECTED   Barbiturates NONE DETECTED NONE DETECTED    Comment:        DRUG SCREEN FOR MEDICAL PURPOSES ONLY.  IF CONFIRMATION IS NEEDED FOR ANY PURPOSE, NOTIFY LAB WITHIN 5 DAYS.        LOWEST DETECTABLE LIMITS FOR URINE DRUG SCREEN Drug Class       Cutoff (ng/mL) Amphetamine      1000 Barbiturate      200 Benzodiazepine   585 Tricyclics       277 Opiates          300 Cocaine          300 THC              50   Ethanol     Status: None   Collection Time: 08/01/15  8:49 AM  Result Value Ref Range   Alcohol, Ethyl (B) <5 <5 mg/dL    Comment:        LOWEST DETECTABLE LIMIT FOR SERUM ALCOHOL IS 5 mg/dL FOR MEDICAL PURPOSES ONLY   Acetaminophen level     Status: Abnormal   Collection Time: 08/01/15  8:49 AM  Result Value Ref Range   Acetaminophen (Tylenol), Serum <10 (L) 10 - 30 ug/mL    Comment:        THERAPEUTIC CONCENTRATIONS VARY SIGNIFICANTLY. A RANGE OF 10-30 ug/mL MAY BE AN EFFECTIVE CONCENTRATION FOR MANY PATIENTS. HOWEVER, SOME ARE BEST TREATED AT CONCENTRATIONS OUTSIDE THIS RANGE. ACETAMINOPHEN CONCENTRATIONS >150 ug/mL AT 4 HOURS AFTER INGESTION AND >50 ug/mL AT 12 HOURS AFTER INGESTION ARE OFTEN ASSOCIATED WITH TOXIC REACTIONS.   Salicylate level     Status: None   Collection Time: 08/01/15  8:49 AM  Result Value Ref Range   Salicylate Lvl <8.2 2.8 - 30.0 mg/dL  CBC with Differential     Status: None   Collection  Time: 08/01/15  8:53 AM  Result Value Ref Range   WBC 9.9 4.0 - 10.5 K/uL   RBC 5.23 4.22 - 5.81 MIL/uL   Hemoglobin 14.5 13.0 - 17.0 g/dL   HCT 43.3 39.0 - 52.0 %   MCV 82.8 78.0 - 100.0 fL   MCH 27.7 26.0 - 34.0 pg   MCHC 33.5 30.0 - 36.0 g/dL   RDW 14.3 11.5 - 15.5 %   Platelets 298 150 - 400 K/uL   Neutrophils Relative % 64 %   Neutro Abs 6.3  1.7 - 7.7 K/uL   Lymphocytes Relative 24 %   Lymphs Abs 2.4 0.7 - 4.0 K/uL   Monocytes Relative 8 %   Monocytes Absolute 0.8 0.1 - 1.0 K/uL   Eosinophils Relative 4 %   Eosinophils Absolute 0.4 0.0 - 0.7 K/uL   Basophils Relative 0 %   Basophils Absolute 0.0 0.0 - 0.1 K/uL  Comprehensive metabolic panel     Status: Abnormal   Collection Time: 08/01/15  8:53 AM  Result Value Ref Range   Sodium 139 135 - 145 mmol/L   Potassium 4.1 3.5 - 5.1 mmol/L   Chloride 108 101 - 111 mmol/L   CO2 21 (L) 22 - 32 mmol/L   Glucose, Bld 334 (H) 65 - 99 mg/dL   BUN 8 6 - 20 mg/dL   Creatinine, Ser 0.96 0.61 - 1.24 mg/dL   Calcium 9.0 8.9 - 10.3 mg/dL   Total Protein 7.1 6.5 - 8.1 g/dL   Albumin 3.7 3.5 - 5.0 g/dL   AST 51 (H) 15 - 41 U/L   ALT 40 17 - 63 U/L   Alkaline Phosphatase 108 38 - 126 U/L   Total Bilirubin 0.4 0.3 - 1.2 mg/dL   GFR calc non Af Amer >60 >60 mL/min   GFR calc Af Amer >60 >60 mL/min    Comment: (NOTE) The eGFR has been calculated using the CKD EPI equation. This calculation has not been validated in all clinical situations. eGFR's persistently <60 mL/min signify possible Chronic Kidney Disease.    Anion gap 10 5 - 15  CBG monitoring, ED     Status: Abnormal   Collection Time: 08/02/15  9:29 AM  Result Value Ref Range   Glucose-Capillary 405 (H) 65 - 99 mg/dL  Comprehensive metabolic panel     Status: Abnormal   Collection Time: 08/02/15 10:00 AM  Result Value Ref Range   Sodium 141 135 - 145 mmol/L   Potassium 4.0 3.5 - 5.1 mmol/L   Chloride 108 101 - 111 mmol/L   CO2 23 22 - 32 mmol/L   Glucose, Bld 262 (H) 65 -  99 mg/dL   BUN 13 6 - 20 mg/dL   Creatinine, Ser 0.88 0.61 - 1.24 mg/dL   Calcium 9.5 8.9 - 10.3 mg/dL   Total Protein 6.6 6.5 - 8.1 g/dL   Albumin 3.2 (L) 3.5 - 5.0 g/dL   AST 36 15 - 41 U/L   ALT 36 17 - 63 U/L   Alkaline Phosphatase 87 38 - 126 U/L   Total Bilirubin 0.4 0.3 - 1.2 mg/dL   GFR calc non Af Amer >60 >60 mL/min   GFR calc Af Amer >60 >60 mL/min    Comment: (NOTE) The eGFR has been calculated using the CKD EPI equation. This calculation has not been validated in all clinical situations. eGFR's persistently <60 mL/min signify possible Chronic Kidney Disease.    Anion gap 10 5 - 15    Current Facility-Administered Medications  Medication Dose Route Frequency Provider Last Rate Last Dose  . alum & mag hydroxide-simeth (MAALOX/MYLANTA) 200-200-20 MG/5ML suspension 30 mL  30 mL Oral PRN Forde Dandy, MD      . ibuprofen (ADVIL,MOTRIN) tablet 600 mg  600 mg Oral Q8H PRN Forde Dandy, MD      . LORazepam (ATIVAN) tablet 1 mg  1 mg Oral Q8H PRN Forde Dandy, MD      . Derrill Memo ON 08/03/2015] metFORMIN (GLUCOPHAGE) tablet 500 mg  500  mg Oral BID WC Master Touchet      . multivitamin with minerals tablet 1 tablet  1 tablet Oral Daily Forde Dandy, MD   1 tablet at 08/02/15 1056  . nicotine (NICODERM CQ - dosed in mg/24 hours) patch 21 mg  21 mg Transdermal Daily Forde Dandy, MD      . ondansetron [facility]) tablet 4 mg  4 mg Oral Q8H PRN Forde Dandy, MD      . traZODone (DESYREL) tablet 100 mg  100 mg Oral QHS Nelvin Tomb      . trihexyphenidyl (ARTANE) tablet 2 mg  2 mg Oral BID WC Kadi Hession      . ziprasidone (GEODON) capsule 40 mg  40 mg Oral BID WC Arieh Bogue       Current Outpatient Prescriptions  Medication Sig Dispense Refill  . metFORMIN (GLUCOPHAGE) 500 MG tablet Take 500 mg by mouth 2 (two) times daily with a meal.     . Multiple Vitamin (MULTIVITAMIN WITH MINERALS) TABS tablet Take 1 tablet by mouth daily.    . risperiDONE (RISPERDAL M-TABS) 1 MG  disintegrating tablet Take 1 tablet (1 mg total) by mouth 2 (two) times daily. 60 tablet 0  . traZODone (DESYREL) 50 MG tablet Take 1 tablet (50 mg total) by mouth at bedtime. 30 tablet 0  . cyclobenzaprine (FLEXERIL) 10 MG tablet Take 1 tablet (10 mg total) by mouth 2 (two) times daily as needed for muscle spasms. (Patient not taking: Reported on 07/30/2015) 20 tablet 0  . ibuprofen (ADVIL,MOTRIN) 800 MG tablet Take 1 tablet (800 mg total) by mouth 3 (three) times daily. (Patient not taking: Reported on 07/30/2015) 21 tablet 0  . meloxicam (MOBIC) 7.5 MG tablet Take 2 tablets (15 mg total) by mouth daily. (Patient not taking: Reported on 07/27/2015) 30 tablet 0    Musculoskeletal: Strength & Muscle Tone: within normal limits Gait & Station: normal Patient leans: N/A  Psychiatric Specialty Exam: Review of Systems  Constitutional: Negative.   HENT: Negative.   Eyes: Negative.   Respiratory: Negative.   Cardiovascular: Negative.   Gastrointestinal: Negative.   Genitourinary: Negative.   Musculoskeletal: Negative.   Skin: Negative.   Neurological: Negative.   Endo/Heme/Allergies: Negative.   Psychiatric/Behavioral: The patient is nervous/anxious.        Hypomania symptoms    Blood pressure 138/91, pulse 102, temperature 98.5 F (36.9 C), temperature source Oral, resp. rate 20, SpO2 100 %.There is no weight on file to calculate BMI.  General Appearance: Casual  Eye Contact::  Good  Speech:  Normal Rate  Volume:  Normal  Mood:  Euphoric  Affect:  Congruent  Thought Process:  Coherent  Orientation:  Full (Time, Place, and Person)  Thought Content:  WDL  Suicidal Thoughts:  No  Homicidal Thoughts:  No  Memory:  Immediate;   Good Recent;   Good Remote;   Good  Judgement:  Fair  Insight:  Fair  Psychomotor Activity:  Normal  Concentration:  Good  Recall:  Good  Fund of Knowledge:Good  Language: Good  Akathisia:  No  Handed:  Right  AIMS (if indicated):     Assets:  Leisure  Time Physical Health Resilience Social Support  ADL's:  Intact  Cognition: WNL  Sleep:      Treatment Plan Summary: Daily contact with patient to assess and evaluate symptoms and progress in treatment, Medication management and Plan bipolar disorder, moderate, mania:  -Crisis stabilization -Medication management:  PRN Geodon/Zyprexa/and  Ativan IM given last night, not continued today.  His Geodon 40 mg BID for psychosis and mood stabilization, Artane 2 mg BID to prevent EPS, Trazodone 100 mg at bedtime for sleep, and Ativan 1 mg every 8 hours PRN anxiety continued. -Individual counseling  Disposition: Supportive therapy provided about ongoing stressors.  Waylan Boga, [place] 08/02/2015 4:36 PM Patient seen face-to-face for psychiatric evaluation, chart reviewed and case discussed with the physician extender and developed treatment plan. Reviewed the information documented and agree with the treatment plan. Corena Pilgrim, MD

## 2015-08-02 NOTE — ED Notes (Addendum)
Pt transferred to room 38 via w/c.  Pt amulatory w/ assist to bathroom, rt sided limp noted, pt alert, nad, sitter w/ pt to assist. Pt ate supper PTA.

## 2015-08-02 NOTE — ED Notes (Signed)
Patient again at the window cursing, yelling and threatening staff. Multiple times and continuously yelling racist remarks.

## 2015-08-02 NOTE — ED Notes (Signed)
Pt. In hall c/o anxiety and yelling incoherently. Pt. Redirected to his room by MHT.

## 2015-08-02 NOTE — ED Notes (Signed)
Pt. Noted walking in hall with sitter. No complaints or concerns voiced. No distress or abnormal behavior noted. Will continue to monitor with security cameras. Q 15 minute rounds continue.

## 2015-08-02 NOTE — ED Notes (Signed)
Pt. Yelling, demanding help to the bathroom. Two male staff required to assist pt. To restroom since pt. C/o right sided weakness.
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
## 2015-08-03 DIAGNOSIS — F319 Bipolar disorder, unspecified: Secondary | ICD-10-CM | POA: Diagnosis not present

## 2015-08-03 DIAGNOSIS — F309 Manic episode, unspecified: Secondary | ICD-10-CM | POA: Diagnosis not present

## 2015-08-03 NOTE — ED Notes (Signed)
Pt. Noted walking in room. No complaints or concerns voiced. No distress or abnormal behavior noted. Will continue to monitor with security cameras. Q 15 minute rounds continue.

## 2015-08-03 NOTE — ED Notes (Signed)
Pt. In his room singing intermittently. Pt. Instructed to keep the volume down to avoid disturbing other patients.

## 2015-08-03 NOTE — ED Notes (Signed)
Pt. Up to restroom without a problem.

## 2015-08-03 NOTE — Discharge Instructions (Signed)
For your ongoing behavioral health needs, you are advised to follow up with [facility], your current ACT Team provider:       [facility]      [address]      [place] [id number]      [phone]

## 2015-08-03 NOTE — ED Notes (Signed)
Pt. Noted sleeping in room. No complaints or concerns voiced. No distress or abnormal behavior noted. Will continue to monitor with security cameras. Q 15 minute rounds continue. 

## 2015-08-03 NOTE — ED Notes (Signed)
Patient walked to rest room without difficulty. On his way back to his room he looked back at the nurses station and sat down on the floor then lay back and looked to see if staff were looking at him. MHT John approached pt. Who got up and went to his room without problems and lay down on his stretcher.

## 2015-08-03 NOTE — ED Notes (Signed)
Sitter at bedside.

## 2015-08-03 NOTE — ED Notes (Signed)
Pt. Got up off his stretcher sat in the floor and lay down and  began calling out in a loud voice. Pt. Assisted to his stretcher. This manipulative behavior is noted if staff are not sitting right next to pt.

## 2015-08-03 NOTE — ED Notes (Signed)
Pt. To rest room without assistance. No complaints or concerns voiced. No acute distress noted. Will continue to monitor with security cameras. Q 15 minute rounds continue.

## 2015-08-03 NOTE — Consult Note (Signed)
[place] [facility] Consult   Reason for Consult:  Mania Referring Physician:  EDP Patient Identification: Charles Leon MRN:  [id number] Principal Diagnosis: Bipolar 1 disorder, depressed ([place]) Diagnosis:   Patient Active Problem List   Diagnosis Date Noted  . Bipolar 1 disorder, depressed ([place]) [F31.9] 06/06/2014    Priority: High  . Mania ([place]) [F30.9]     Total Time spent with patient: 30 minutes  Subjective:   Charles Leon is a 55 y.o. male patient does not warrant admission, release when medically stable.  HPI:  55 yo male is now at his baseline without suicidal/homicidal ideations, hallucinations, and alcohol/drug abuse.  His ACT team will be notified of his discharge to monitor him.  Stable for discharge.  Past Psychiatric History: Bipolar disorder  Risk to Self: Suicidal Ideation: No Suicidal Intent: No Is patient at risk for suicide?: No Suicidal Plan?: No Access to Means: No What has been your use of drugs/alcohol within the last 12 months?: Denies How many times?: 1 (overdose on Haldol 1981) Other Self Harm Risks: Denies Triggers for Past Attempts: Unknown Intentional Self Injurious Behavior: None Risk to Others: Homicidal Ideation: No Thoughts of Harm to Others: No Comment - Thoughts of Harm to Others: Denies Current Homicidal Intent: No Current Homicidal Plan: No Access to Homicidal Means: No Identified Victim: Denies History of harm to others?: No Assessment of Violence: None Noted Violent Behavior Description: Denies Does patient have access to weapons?: No Criminal Charges Pending?: Yes Describe Pending Criminal Charges: took three cars to [place] Does patient have a court date: Yes Court Date: 09/22/15 Prior Inpatient Therapy: Prior Inpatient Therapy: Yes Prior Therapy Dates: November Prior Therapy Facilty/Provider(s): [facility] Reason for Treatment: SI Prior Outpatient Therapy: Prior Outpatient Therapy: Yes Prior Therapy Dates: 8  months Prior Therapy Facilty/Provider(s): [facility] (Ray Cox ) Reason for Treatment: ACTT Does patient have an ACCT team?: Yes Does patient have Intensive In-House Services?  : No Does patient have [facility] services? : No Does patient have P4CC services?: No  Past Medical History:  Past Medical History  Diagnosis Date  . Lumbar spinal cord injury ([place]) 2012  . Bipolar disorder ([place])   . Depression   . Hypertension   . Diabetes mellitus without complication [facility])     Past Surgical History  Procedure Laterality Date  . Back surgery    . Eye surgery     Family History: No family history on file. Family Psychiatric  History: None Social History:  History  Alcohol Use No    Comment: occ, denies recent injestion     History  Drug Use No    Comment: Hx of crack/cocaine, THC denies 06/12/2015    Social History   Social History  . Marital Status: Single    Spouse Name: N/A  . Number of Children: N/A  . Years of Education: N/A   Social History Main Topics  . Smoking status: Current Every Day Smoker    Types: Cigarettes  . Smokeless tobacco: None  . Alcohol Use: No     Comment: occ, denies recent injestion  . Drug Use: No     Comment: Hx of crack/cocaine, THC denies 06/12/2015  . Sexual Activity: Not Asked   Other Topics Concern  . None   Social History Narrative   Additional Social History:    Pain Medications: See PTA Prescriptions: See PTA Over the Counter: See PTA History of alcohol / drug use?: Yes Name of Substance 1: Heroin 1 - Age  of First Use: 15 1 - Last Use / Amount: 30 years ago Name of Substance 2: Crack/Cocaine 2 - Last Use / Amount: 6 years ago                 Allergies:   Allergies  Allergen Reactions  . Haldol [Haloperidol] Other (See Comments)    Stiff neck    Labs:  Results for orders placed or performed during the hospital encounter of 08/01/15 (from the past 48 hour(s))  CBG monitoring, ED     Status: Abnormal    Collection Time: 08/02/15  9:29 AM  Result Value Ref Range   Glucose-Capillary 405 (H) 65 - 99 mg/dL  Comprehensive metabolic panel     Status: Abnormal   Collection Time: 08/02/15 10:00 AM  Result Value Ref Range   Sodium 141 135 - 145 mmol/L   Potassium 4.0 3.5 - 5.1 mmol/L   Chloride 108 101 - 111 mmol/L   CO2 23 22 - 32 mmol/L   Glucose, Bld 262 (H) 65 - 99 mg/dL   BUN 13 6 - 20 mg/dL   Creatinine, Ser 0.88 0.61 - 1.24 mg/dL   Calcium 9.5 8.9 - 10.3 mg/dL   Total Protein 6.6 6.5 - 8.1 g/dL   Albumin 3.2 (L) 3.5 - 5.0 g/dL   AST 36 15 - 41 U/L   ALT 36 17 - 63 U/L   Alkaline Phosphatase 87 38 - 126 U/L   Total Bilirubin 0.4 0.3 - 1.2 mg/dL   GFR calc non Af Amer >60 >60 mL/min   GFR calc Af Amer >60 >60 mL/min    Comment: (NOTE) The eGFR has been calculated using the CKD EPI equation. This calculation has not been validated in all clinical situations. eGFR's persistently <60 mL/min signify possible Chronic Kidney Disease.    Anion gap 10 5 - 15  CBG monitoring, ED     Status: Abnormal   Collection Time: 08/02/15  5:13 PM  Result Value Ref Range   Glucose-Capillary 161 (H) 65 - 99 mg/dL    Current Facility-Administered Medications  Medication Dose Route Frequency Provider Last Rate Last Dose  . alum & mag hydroxide-simeth (MAALOX/MYLANTA) 200-200-20 MG/5ML suspension 30 mL  30 mL Oral PRN Forde Dandy, MD      . ibuprofen (ADVIL,MOTRIN) tablet 600 mg  600 mg Oral Q8H PRN Forde Dandy, MD   600 mg at 08/02/15 1726  . LORazepam (ATIVAN) tablet 1 mg  1 mg Oral Q8H PRN Forde Dandy, MD   1 mg at 08/02/15 2240  . metFORMIN (GLUCOPHAGE) tablet 500 mg  500 mg Oral BID WC Charles Andringa   500 mg at 08/03/15 0803  . multivitamin with minerals tablet 1 tablet  1 tablet Oral Daily Forde Dandy, MD   1 tablet at 08/02/15 1056  . nicotine (NICODERM CQ - dosed in mg/24 hours) patch 21 mg  21 mg Transdermal Daily Forde Dandy, MD      . ondansetron [facility]) tablet 4 mg  4 mg Oral Q8H  PRN Forde Dandy, MD      . traZODone (DESYREL) tablet 100 mg  100 mg Oral QHS Ioanna Colquhoun   100 mg at 08/02/15 2118  . trihexyphenidyl (ARTANE) tablet 2 mg  2 mg Oral BID WC Mava Suares   2 mg at 08/03/15 0805  . ziprasidone (GEODON) capsule 40 mg  40 mg Oral BID WC Yvonne Petite   40 mg at 08/03/15 4098  Current Outpatient Prescriptions  Medication Sig Dispense Refill  . metFORMIN (GLUCOPHAGE) 500 MG tablet Take 500 mg by mouth 2 (two) times daily with a meal.     . Multiple Vitamin (MULTIVITAMIN WITH MINERALS) TABS tablet Take 1 tablet by mouth daily.    . risperiDONE (RISPERDAL M-TABS) 1 MG disintegrating tablet Take 1 tablet (1 mg total) by mouth 2 (two) times daily. 60 tablet 0  . traZODone (DESYREL) 50 MG tablet Take 1 tablet (50 mg total) by mouth at bedtime. 30 tablet 0  . cyclobenzaprine (FLEXERIL) 10 MG tablet Take 1 tablet (10 mg total) by mouth 2 (two) times daily as needed for muscle spasms. (Patient not taking: Reported on 07/30/2015) 20 tablet 0  . ibuprofen (ADVIL,MOTRIN) 800 MG tablet Take 1 tablet (800 mg total) by mouth 3 (three) times daily. (Patient not taking: Reported on 07/30/2015) 21 tablet 0  . meloxicam (MOBIC) 7.5 MG tablet Take 2 tablets (15 mg total) by mouth daily. (Patient not taking: Reported on 07/27/2015) 30 tablet 0    Musculoskeletal: Strength & Muscle Tone: within normal limits Gait & Station: normal Patient leans: N/A  Psychiatric Specialty Exam: Review of Systems  Constitutional: Negative.   HENT: Negative.   Eyes: Negative.   Respiratory: Negative.   Cardiovascular: Negative.   Gastrointestinal: Negative.   Genitourinary: Negative.   Musculoskeletal: Negative.   Skin: Negative.   Neurological: Negative.   Endo/Heme/Allergies: Negative.   Psychiatric/Behavioral:       Hypomania symptoms    Blood pressure 139/84, pulse 97, temperature 98.1 F (36.7 C), temperature source Oral, resp. rate 18, SpO2 90 %.There is no weight on file to  calculate BMI.  General Appearance: Casual  Eye Contact::  Good  Speech:  Normal Rate  Volume:  Normal  Mood:  Euthymic  Affect:  Congruent  Thought Process:  Coherent  Orientation:  Full (Time, Place, and Person)  Thought Content:  WDL  Suicidal Thoughts:  No  Homicidal Thoughts:  No  Memory:  Immediate;   Good Recent;   Good Remote;   Good  Judgement:  Fair  Insight:  Fair  Psychomotor Activity:  Normal  Concentration:  Good  Recall:  Good  Fund of Knowledge:Good  Language: Good  Akathisia:  No  Handed:  Right  AIMS (if indicated):     Assets:  Leisure Time Physical Health Resilience Social Support  ADL's:  Intact  Cognition: WNL  Sleep:      Treatment Plan Summary: Daily contact with patient to assess and evaluate symptoms and progress in treatment, Medication management and Plan bipolar disorder, moderate, mania:  -Crisis stabilization -Medication management:   His Geodon 40 mg BID for psychosis and mood stabilization, Artane 2 mg BID to prevent EPS, Trazodone 100 mg at bedtime for sleep, and Ativan 1 mg every 8 hours PRN anxiety continued. -Individual counseling, ACT notification of discharge  Disposition: Discharge home with ACT team  Waylan Boga, [place] 08/03/2015 9:42 AM Patient seen face-to-face for psychiatric evaluation, chart reviewed and case discussed with the physician extender and developed treatment plan. Reviewed the information documented and agree with the treatment plan. Corena Pilgrim, MD

## 2015-08-03 NOTE — BH Assessment (Signed)
B[facility] Assessment Progress Note  Per Thedore MinsMojeed Akintayo, MD, this pt does not require psychiatric hospitalization at this time.  He presents under Certificate initiated by EDP Crista Curbana Liu, MD.  [facility].  This has been included in pt's discharge instructions.  IVC has been rescinded.  Pt's nurse, Kendal Hymendie, has been notified.  Doylene Canninghomas Kellie Chisolm, MA Triage Specialist [phone]

## 2015-08-03 NOTE — ED Notes (Signed)
Pt discharged ambulatory with discharge instructions and bus pass.  Pt denied SI, HI, and AVH at discharge.  All belongings were returned to pt.

## 2015-08-03 NOTE — ED Notes (Signed)
Pt. In hall cursing loudly when asked to go back to his room since all the other patients sleeping.

## 2015-08-03 NOTE — BHH Suicide Risk Assessment (Signed)
Suicide Risk Assessment  Discharge Assessment   [facility]   Demographic Factors:  Living alone  Total Time spent with patient: 30 minutes  Musculoskeletal: Strength & Muscle Tone: within normal limits Gait & Station: normal Patient leans: N/A  Psychiatric Specialty Exam: Review of Systems  Constitutional: Negative.   HENT: Negative.   Eyes: Negative.   Respiratory: Negative.   Cardiovascular: Negative.   Gastrointestinal: Negative.   Genitourinary: Negative.   Musculoskeletal: Negative.   Skin: Negative.   Neurological: Negative.   Endo/Heme/Allergies: Negative.   Psychiatric/Behavioral:       Hypomania symptoms    Blood pressure 139/84, pulse 97, temperature 98.1 F (36.7 C), temperature source Oral, resp. rate 18, SpO2 90 %.There is no weight on file to calculate BMI.  General Appearance: Casual  Eye Contact::  Good  Speech:  Normal Rate  Volume:  Normal  Mood:  Euthymic  Affect:  Congruent  Thought Process:  Coherent  Orientation:  Full (Time, Place, and Person)  Thought Content:  WDL  Suicidal Thoughts:  No  Homicidal Thoughts:  No  Memory:  Immediate;   Good Recent;   Good Remote;   Good  Judgement:  Fair  Insight:  Fair  Psychomotor Activity:  Normal  Concentration:  Good  Recall:  Good  Fund of Knowledge:Good  Language: Good  Akathisia:  No  Handed:  Right  AIMS (if indicated):     Assets:  Leisure Time Physical Health Resilience Social Support  ADL's:  Intact  Cognition: WNL  Sleep:      Has this patient used any form of tobacco in the last 30 days? (Cigarettes, Smokeless Tobacco, Cigars, and/or Pipes) No  Mental Status Per Nursing Assessment::   On Admission:   mania  Current Mental Status by Physician: NA  Loss Factors: NA  Historical Factors: NA  Risk Reduction Factors:   Sense of responsibility to family, Positive social support and Positive therapeutic relationship  Continued Clinical Symptoms:   None  Cognitive Features That Contribute To Risk:  None    Suicide Risk:  Minimal: No identifiable suicidal ideation.  Patients presenting with no risk factors but with morbid ruminations; may be classified as minimal risk based on the severity of the depressive symptoms  Principal Problem: Bipolar 1 disorder, depressed [facility](HCC) Discharge Diagnoses:  Patient Active Problem List   Diagnosis Date Noted  . Bipolar 1 disorder, depressed ([facility]) [F31.9] 06/06/2014    Priority: High  . Mania ([facility]) [F30.9]       Plan Of Care/Follow-up recommendations:  Activity:  as tolerated  Diet:  heart healthy diet  Is patient on multiple antipsychotic therapies at discharge:  No   Has Patient had three or more failed trials of antipsychotic monotherapy by history:  No  Recommended Plan for Multiple Antipsychotic Therapies: NA    Jantzen Pilger, PMH-NP 08/03/2015, 9:46 AM

## 2015-08-03 NOTE — ED Notes (Signed)
Pt. Noted in room. No complaints or concerns voiced. No distress or abnormal behavior noted. Will continue to monitor with security cameras. Q 15 minute rounds continue. 

## 2015-08-03 NOTE — BHH Counselor (Signed)
Dr. Jannifer FranklinAkintayo and [name], DNP recommend patient be discharged from [facility] to his ACTT with [facility]. Contacted [facility] at [phone] to request picking patient up and there was no answer. Voicemail left with request to return phone call as soon as possible at [phone].  Davina PokeJoVea Saiya Crist, LCSW Therapeutic Triage Specialist [facility] 08/03/2015 9:46 AM

## 2015-08-03 NOTE — ED Notes (Signed)
Pt. Able to ambulate without assistance as long as staff in area. Pt. Initially told staff that he was weak on the right side but now mild limp noted on left. Pt. Questioned about the change in the affected side. Pt. Couldn't explain why the affected side had changed.

## 2015-08-03 NOTE — ED Notes (Signed)
Pt. Up to restroom with minimal assistance.

## 2015-08-04 ENCOUNTER — Emergency Department (HOSPITAL_COMMUNITY): Payer: Medicaid Other

## 2015-08-04 ENCOUNTER — Encounter (HOSPITAL_COMMUNITY): Payer: Self-pay | Admitting: Emergency Medicine

## 2015-08-04 ENCOUNTER — Encounter (HOSPITAL_COMMUNITY): Payer: Self-pay | Admitting: *Deleted

## 2015-08-04 ENCOUNTER — Emergency Department (HOSPITAL_COMMUNITY)
Admission: EM | Admit: 2015-08-04 | Discharge: 2015-08-04 | Disposition: A | Discharge status: Home / Self Care | Payer: Medicaid Other | Attending: Physician Assistant | Admitting: Physician Assistant

## 2015-08-04 ENCOUNTER — Emergency Department (HOSPITAL_COMMUNITY)
Admission: EM | Admit: 2015-08-04 | Discharge: 2015-08-04 | Discharge status: Against Medical Advice | Payer: Medicaid Other | Attending: Emergency Medicine | Admitting: Emergency Medicine

## 2015-08-04 DIAGNOSIS — Z79899 Other long term (current) drug therapy: Secondary | ICD-10-CM | POA: Insufficient documentation

## 2015-08-04 DIAGNOSIS — R079 Chest pain, unspecified: Secondary | ICD-10-CM | POA: Diagnosis not present

## 2015-08-04 DIAGNOSIS — E119 Type 2 diabetes mellitus without complications: Secondary | ICD-10-CM | POA: Insufficient documentation

## 2015-08-04 DIAGNOSIS — Y998 Other external cause status: Secondary | ICD-10-CM | POA: Diagnosis not present

## 2015-08-04 DIAGNOSIS — F319 Bipolar disorder, unspecified: Secondary | ICD-10-CM | POA: Insufficient documentation

## 2015-08-04 DIAGNOSIS — F17210 Nicotine dependence, cigarettes, uncomplicated: Secondary | ICD-10-CM | POA: Diagnosis not present

## 2015-08-04 DIAGNOSIS — Z7984 Long term (current) use of oral hypoglycemic drugs: Secondary | ICD-10-CM | POA: Diagnosis not present

## 2015-08-04 DIAGNOSIS — W010XXA Fall on same level from slipping, tripping and stumbling without subsequent striking against object, initial encounter: Secondary | ICD-10-CM | POA: Diagnosis not present

## 2015-08-04 DIAGNOSIS — I10 Essential (primary) hypertension: Secondary | ICD-10-CM | POA: Insufficient documentation

## 2015-08-04 DIAGNOSIS — S80811A Abrasion, right lower leg, initial encounter: Secondary | ICD-10-CM | POA: Insufficient documentation

## 2015-08-04 DIAGNOSIS — Y9289 Other specified places as the place of occurrence of the external cause: Secondary | ICD-10-CM | POA: Diagnosis not present

## 2015-08-04 DIAGNOSIS — T148XXA Other injury of unspecified body region, initial encounter: Secondary | ICD-10-CM

## 2015-08-04 DIAGNOSIS — Y9389 Activity, other specified: Secondary | ICD-10-CM | POA: Insufficient documentation

## 2015-08-04 DIAGNOSIS — S8991XA Unspecified injury of right lower leg, initial encounter: Secondary | ICD-10-CM | POA: Diagnosis present

## 2015-08-04 LAB — BASIC METABOLIC PANEL
Anion gap: 9 (ref 5–15)
BUN: 14 mg/dL (ref 6–20)
CALCIUM: 9.3 mg/dL (ref 8.9–10.3)
CHLORIDE: 107 mmol/L (ref 101–111)
CO2: 23 mmol/L (ref 22–32)
CREATININE: 0.93 mg/dL (ref 0.61–1.24)
GFR calc Af Amer: >60 mL/min (ref >60)
GFR calc non Af Amer: >60 mL/min (ref >60)
GLUCOSE: 163 mg/dL — AB (ref 65–99)
Potassium: 4.1 mmol/L (ref 3.5–5.1)
Sodium: 139 mmol/L (ref 135–145)

## 2015-08-04 LAB — I-STAT TROPONIN, ED: TROPONIN I, POC: 0 ng/mL (ref 0.00–0.08)

## 2015-08-04 LAB — CBC
HCT: 45 % (ref 39.0–52.0)
Hemoglobin: 15.1 g/dL (ref 13.0–17.0)
MCH: 27.8 pg (ref 26.0–34.0)
MCHC: 33.6 g/dL (ref 30.0–36.0)
MCV: 82.7 fL (ref 78.0–100.0)
PLATELETS: 313 10*3/uL (ref 150–400)
RBC: 5.44 MIL/uL (ref 4.22–5.81)
RDW: 14.6 % (ref 11.5–15.5)
WBC: 11.6 10*3/uL — ABNORMAL HIGH (ref 4.0–10.5)

## 2015-08-04 MED ORDER — TETANUS-DIPHTH-ACELL PERTUSSIS 5-2.5-18.5 LF-MCG/0.5 IM SUSP
0.5000 mL | Freq: Once | INTRAMUSCULAR | Status: AC
  Administered 2015-08-04: 0.5 mL via INTRAMUSCULAR
  Filled 2015-08-04: qty 0.5

## 2015-08-04 MED ORDER — IBUPROFEN 800 MG PO TABS
800.0000 mg | ORAL_TABLET | Freq: Once | ORAL | Status: AC
  Administered 2015-08-04: 800 mg via ORAL
  Filled 2015-08-04: qty 1

## 2015-08-04 NOTE — ED Provider Notes (Addendum)
CSN: [id number]     Arrival date & time 08/04/15  4098 History   First MD Initiated Contact with Patient 08/04/15 0901     Chief Complaint  Patient presents with  . Fall     (Consider location/radiation/quality/duration/timing/severity/associated sxs/prior Treatment) HPI   Patient is a pleasant 55 year old male with history of bipolar depression diabetes hypertension and right arm paralysis presenting today after fall while trying to get on the bus. Patient tried to step up onto the bus and scraped his right shin. He's got no deficits to the right lower extremity. He is moving all 4 extremities normally. Small abrasion on right lower extremity.  Patient has no complaints except for abrasion.   Past Medical History  Diagnosis Date  . Lumbar spinal cord injury ([facility]) 2012  . Bipolar disorder ([facility])   . Depression   . Hypertension   . Diabetes mellitus without complication [facility])    Past Surgical History  Procedure Laterality Date  . Back surgery    . Eye surgery     History reviewed. No pertinent family history. Social History  Substance Use Topics  . Smoking status: Current Every Day Smoker    Types: Cigarettes  . Smokeless tobacco: None  . Alcohol Use: No     Comment: occ, denies recent injestion    Review of Systems  Constitutional: Negative for fever.  Respiratory: Negative for cough.   Cardiovascular: Negative for chest pain.  Gastrointestinal: Negative for abdominal pain.  All other systems reviewed and are negative.     Allergies  Haldol  Home Medications   Prior to Admission medications   Medication Sig Start Date End Date Taking? Authorizing Provider  lisinopril (PRINIVIL,ZESTRIL) 20 MG tablet Take 20 mg by mouth daily.   Yes Historical Provider, MD  metFORMIN (GLUCOPHAGE) 500 MG tablet Take 500 mg by mouth 2 (two) times daily with a meal.    Yes Historical Provider, MD  Multiple Vitamin (MULTIVITAMIN WITH MINERALS) TABS tablet Take 1 tablet by mouth  daily.   Yes Historical Provider, MD  risperiDONE (RISPERDAL M-TABS) 1 MG disintegrating tablet Take 1 tablet (1 mg total) by mouth 2 (two) times daily. 07/28/15  Yes Earney Navy, NP  tamsulosin (FLOMAX) 0.4 MG CAPS capsule Take 0.4 mg by mouth daily.   Yes Historical Provider, MD  traZODone (DESYREL) 50 MG tablet Take 1 tablet (50 mg total) by mouth at bedtime. 07/28/15  Yes Earney Navy, NP  cyclobenzaprine (FLEXERIL) 10 MG tablet Take 1 tablet (10 mg total) by mouth 2 (two) times daily as needed for muscle spasms. Patient not taking: Reported on 07/30/2015 07/29/15   Elpidio Anis, PA-C  ibuprofen (ADVIL,MOTRIN) 800 MG tablet Take 1 tablet (800 mg total) by mouth 3 (three) times daily. Patient not taking: Reported on 07/30/2015 07/29/15   Elpidio Anis, PA-C  meloxicam (MOBIC) 7.5 MG tablet Take 2 tablets (15 mg total) by mouth daily. Patient not taking: Reported on 07/27/2015 07/26/15   Antony Madura, PA-C   BP 153/82 mmHg  Pulse 110  Temp(Src) 99 F (37.2 C) (Oral)  Resp 18  SpO2 98% Physical Exam  Constitutional: He is oriented to person, place, and time. He appears well-nourished.  HENT:  Head: Normocephalic.  Mouth/Throat: Oropharynx is clear and moist.  Eyes: Conjunctivae are normal.  Neck: No tracheal deviation present.  Cardiovascular: Normal rate.   Pulmonary/Chest: Effort normal. No respiratory distress.  Musculoskeletal: Normal range of motion.  Neurological: He is oriented to person, place, and time.  No cranial nerve deficit.  Skin: Skin is warm and dry. He is not diaphoretic.  Small abrasion to right shin.  Psychiatric: He has a normal mood and affect. His behavior is normal.  Nursing note and vitals reviewed.   ED Course  Procedures (including critical care time) Labs Review Labs Reviewed - No data to display  Imaging Review No results found. I have personally reviewed and evaluated these images and lab results as part of my medical decision-making.   EKG  Interpretation None      MDM   Final diagnoses:  None    Patient is a 55 year old male presenting with abrasion. Patient was trying to get on a bus and had a mechanical fall causing an abrasion to his right lower extremity. Patient has no other complaints. He is in his normal state health otherwise. Moving all 4 extremities. Ambulating at baseline. We'll give him Tdap and ibuprofen.    Mariluz Crespo Randall An, MD 08/04/15 1610  Mata Rowen Randall An, MD 08/04/15 9604

## 2015-08-04 NOTE — ED Notes (Signed)
Per EMS. Pt was having a conversation with his friends at [employer] about scooters when he began to R side CP. No pain when he is not thinking about his chest. Tenderness to palpation. Pt seen here this am after tripping on the bus.

## 2015-08-04 NOTE — ED Notes (Signed)
No answer from waiting room.

## 2015-08-04 NOTE — ED Notes (Signed)
Patient has ambulated within the department twice independently.

## 2015-08-04 NOTE — ED Notes (Signed)
Bed: JY78 Expected date:  Expected time:  Means of arrival:  Comments: EMS- 54yo M, trip and fall

## 2015-08-04 NOTE — ED Notes (Signed)
Patient was getting on the bus this morning and his foot slipped he scrapped his knee and shin on the steps. Patient has history of leg weakness and ambulates with cane. Patient did not hit his head or any other part of his body.

## 2015-08-04 NOTE — ED Notes (Signed)
Pt did not answer from lobby 

## 2015-08-04 NOTE — ED Notes (Signed)
Did not answer

## 2015-08-05 ENCOUNTER — Emergency Department (HOSPITAL_COMMUNITY)
Admission: EM | Admit: 2015-08-05 | Discharge: 2015-08-05 | Disposition: A | Discharge status: Home / Self Care | Payer: Medicaid Other | Attending: Emergency Medicine | Admitting: Emergency Medicine

## 2015-08-05 ENCOUNTER — Encounter (HOSPITAL_COMMUNITY): Payer: Self-pay

## 2015-08-05 ENCOUNTER — Encounter (HOSPITAL_COMMUNITY): Payer: Self-pay | Admitting: Family Medicine

## 2015-08-05 DIAGNOSIS — Z87828 Personal history of other (healed) physical injury and trauma: Secondary | ICD-10-CM | POA: Insufficient documentation

## 2015-08-05 DIAGNOSIS — Y9289 Other specified places as the place of occurrence of the external cause: Secondary | ICD-10-CM | POA: Diagnosis not present

## 2015-08-05 DIAGNOSIS — F17210 Nicotine dependence, cigarettes, uncomplicated: Secondary | ICD-10-CM | POA: Insufficient documentation

## 2015-08-05 DIAGNOSIS — F319 Bipolar disorder, unspecified: Secondary | ICD-10-CM | POA: Diagnosis not present

## 2015-08-05 DIAGNOSIS — X19XXXA Contact with other heat and hot substances, initial encounter: Secondary | ICD-10-CM | POA: Diagnosis not present

## 2015-08-05 DIAGNOSIS — M542 Cervicalgia: Secondary | ICD-10-CM | POA: Diagnosis present

## 2015-08-05 DIAGNOSIS — Z765 Malingerer [conscious simulation]: Secondary | ICD-10-CM | POA: Insufficient documentation

## 2015-08-05 DIAGNOSIS — T23202A Burn of second degree of left hand, unspecified site, initial encounter: Secondary | ICD-10-CM | POA: Diagnosis not present

## 2015-08-05 DIAGNOSIS — Z7984 Long term (current) use of oral hypoglycemic drugs: Secondary | ICD-10-CM | POA: Insufficient documentation

## 2015-08-05 DIAGNOSIS — Y998 Other external cause status: Secondary | ICD-10-CM | POA: Insufficient documentation

## 2015-08-05 DIAGNOSIS — Z79899 Other long term (current) drug therapy: Secondary | ICD-10-CM | POA: Diagnosis not present

## 2015-08-05 DIAGNOSIS — E119 Type 2 diabetes mellitus without complications: Secondary | ICD-10-CM | POA: Diagnosis not present

## 2015-08-05 DIAGNOSIS — Y93G3 Activity, cooking and baking: Secondary | ICD-10-CM | POA: Diagnosis not present

## 2015-08-05 DIAGNOSIS — Z9889 Other specified postprocedural states: Secondary | ICD-10-CM | POA: Insufficient documentation

## 2015-08-05 DIAGNOSIS — T23002A Burn of unspecified degree of left hand, unspecified site, initial encounter: Secondary | ICD-10-CM | POA: Diagnosis present

## 2015-08-05 DIAGNOSIS — M549 Dorsalgia, unspecified: Secondary | ICD-10-CM | POA: Diagnosis not present

## 2015-08-05 DIAGNOSIS — Z791 Long term (current) use of non-steroidal anti-inflammatories (NSAID): Secondary | ICD-10-CM | POA: Diagnosis not present

## 2015-08-05 DIAGNOSIS — I10 Essential (primary) hypertension: Secondary | ICD-10-CM | POA: Insufficient documentation

## 2015-08-05 DIAGNOSIS — R451 Restlessness and agitation: Secondary | ICD-10-CM | POA: Insufficient documentation

## 2015-08-05 MED ORDER — SILVER SULFADIAZINE 1 % EX CREA
TOPICAL_CREAM | Freq: Once | CUTANEOUS | Status: AC
  Administered 2015-08-05: 11:00:00 via TOPICAL
  Filled 2015-08-05: qty 85
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
## 2015-08-05 NOTE — ED Notes (Signed)
Pt called out on call light. Malachi Bonds, NT entered room and noticed pt was masturbating. NT explained to pt that he cannot be doing that here in the ED. Security aware and entered the room with Press photographer. Charge nurse then explained to the pt again that he cannot be doing any type of sexual activity while in the ED. Pt verbalized that he understood. [employer] at nurses station on standby.

## 2015-08-05 NOTE — ED Notes (Addendum)
This NT followed registration into the room. Pt was standing by the door with a glove. This NT then explained to pt that he needed to get back into the bed so that I could connected him to the cardiac monitor. Pt agreed and got back into bed. Once this NT connected BP, cardiac monitor and pulse ox. Once I placed the pulse ox this pt began playing with his penis. This NT then exited the room.

## 2015-08-05 NOTE — ED Notes (Signed)
Patient threw hand sanitizer bottle toward the housekeeper. [employer] notified and at bedside.

## 2015-08-05 NOTE — ED Notes (Signed)
Discharge instructions and follow up care reviewed with patient. Patient verbalized understanding. 

## 2015-08-05 NOTE — Discharge Instructions (Signed)
GO SEE YOUR DOCTOR FOR FURTHER EVALUATION AND MANAGEMENT.
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
## 2015-08-05 NOTE — Discharge Instructions (Signed)
Return to the ED with any concerns including fever, increased area of redness, pus draining from wound, decreased level of alertness/lethargy, or any other alarming symptoms  You should apply the silvadene cream twice daily to your burn to help prevent infection

## 2015-08-05 NOTE — ED Notes (Signed)
Per charge RN patient does not need to get updated vitals due to no treatment given and patient's behavior while in the ED. Patient being escorted out of ED by security.

## 2015-08-05 NOTE — ED Notes (Signed)
Patient ambulating around the hallway without difficulty.
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
## 2015-08-05 NOTE — ED Notes (Signed)
Patient keeps leaving room and walking in the hallway. Patient then slams door when returning to room. Patient reminded to stay in room by this RN and the charge RN, Misty Stanley A.
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
## 2015-08-06 ENCOUNTER — Emergency Department (HOSPITAL_COMMUNITY)
Admission: EM | Admit: 2015-08-06 | Discharge: 2015-08-06 | Disposition: A | Discharge status: Home / Self Care | Payer: Medicaid Other | Attending: Emergency Medicine | Admitting: Emergency Medicine

## 2015-08-06 ENCOUNTER — Encounter (HOSPITAL_COMMUNITY): Payer: Self-pay | Admitting: Emergency Medicine

## 2015-08-06 DIAGNOSIS — E119 Type 2 diabetes mellitus without complications: Secondary | ICD-10-CM | POA: Insufficient documentation

## 2015-08-06 DIAGNOSIS — Y998 Other external cause status: Secondary | ICD-10-CM | POA: Insufficient documentation

## 2015-08-06 DIAGNOSIS — X102XXA Contact with fats and cooking oils, initial encounter: Secondary | ICD-10-CM | POA: Insufficient documentation

## 2015-08-06 DIAGNOSIS — F319 Bipolar disorder, unspecified: Secondary | ICD-10-CM | POA: Diagnosis not present

## 2015-08-06 DIAGNOSIS — Z79899 Other long term (current) drug therapy: Secondary | ICD-10-CM | POA: Diagnosis not present

## 2015-08-06 DIAGNOSIS — Y9289 Other specified places as the place of occurrence of the external cause: Secondary | ICD-10-CM | POA: Insufficient documentation

## 2015-08-06 DIAGNOSIS — I10 Essential (primary) hypertension: Secondary | ICD-10-CM | POA: Diagnosis not present

## 2015-08-06 DIAGNOSIS — Z23 Encounter for immunization: Secondary | ICD-10-CM | POA: Insufficient documentation

## 2015-08-06 DIAGNOSIS — Z7984 Long term (current) use of oral hypoglycemic drugs: Secondary | ICD-10-CM | POA: Diagnosis not present

## 2015-08-06 DIAGNOSIS — T23262A Burn of second degree of back of left hand, initial encounter: Secondary | ICD-10-CM | POA: Insufficient documentation

## 2015-08-06 DIAGNOSIS — T23202A Burn of second degree of left hand, unspecified site, initial encounter: Secondary | ICD-10-CM

## 2015-08-06 DIAGNOSIS — Y93G3 Activity, cooking and baking: Secondary | ICD-10-CM | POA: Diagnosis not present

## 2015-08-06 DIAGNOSIS — T23062A Burn of unspecified degree of back of left hand, initial encounter: Secondary | ICD-10-CM | POA: Diagnosis present

## 2015-08-06 DIAGNOSIS — F17210 Nicotine dependence, cigarettes, uncomplicated: Secondary | ICD-10-CM | POA: Insufficient documentation

## 2015-08-06 MED ORDER — IBUPROFEN 800 MG PO TABS: 800.0000 mg | ORAL_TABLET | Freq: Three times a day (TID) | ORAL | Status: DC

## 2015-08-06 MED ORDER — TETANUS-DIPHTH-ACELL PERTUSSIS 5-2.5-18.5 LF-MCG/0.5 IM SUSP
0.5000 mL | Freq: Once | INTRAMUSCULAR | Status: AC
  Administered 2015-08-06: 0.5 mL via INTRAMUSCULAR
  Filled 2015-08-06: qty 0.5
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
## 2015-08-06 NOTE — ED Notes (Signed)
Xeroform/kerlix dressing applied to left hand

## 2015-08-06 NOTE — Discharge Instructions (Signed)
Burn Care °Your skin is a natural barrier to infection. It is the largest organ of your body. Burns damage this natural protection. To help prevent infection, it is very important to follow your caregiver's instructions in the care of your burn. °Burns are classified as: °· First degree. There is only redness of the skin (erythema). No scarring is expected. °· Second degree. There is blistering of the skin. Scarring may occur with deeper burns. °· Third degree. All layers of the skin are injured, and scarring is expected. °HOME CARE INSTRUCTIONS  °· Wash your hands well before changing your bandage. °· Change your bandage as often as directed by your caregiver. °¨ Remove the old bandage. If the bandage sticks, you may soak it off with cool, clean water. °¨ Cleanse the burn thoroughly but gently with mild soap and water. °¨ Pat the area dry with a clean, dry cloth. °¨ Apply a thin layer of antibacterial cream to the burn. °¨ Apply a clean bandage as instructed by your caregiver. °¨ Keep the bandage as clean and dry as possible. °· Elevate the affected area for the first 24 hours, then as instructed by your caregiver. °· Only take over-the-counter or prescription medicines for pain, discomfort, or fever as directed by your caregiver. °SEEK IMMEDIATE MEDICAL CARE IF:  °· You develop excessive pain. °· You develop redness, tenderness, swelling, or red streaks near the burn. °· The burned area develops yellowish-white fluid (pus) or a bad smell. °· You have a fever. °MAKE SURE YOU:  °· Understand these instructions. °· Will watch your condition. °· Will get help right away if you are not doing well or get worse. °  °This information is not intended to replace advice given to you by your health care provider. Make sure you discuss any questions you have with your health care provider. °  °Document Released: 07/04/2005 Document Revised: 09/26/2011 Document Reviewed: 11/24/2010 °Elsevier Interactive Patient Education ©2016  [employer] ° °

## 2015-08-07 ENCOUNTER — Emergency Department (HOSPITAL_COMMUNITY)
Admission: EM | Admit: 2015-08-07 | Discharge: 2015-08-07 | Discharge status: Absent without leave | Payer: Medicaid Other

## 2015-08-10 ENCOUNTER — Emergency Department (HOSPITAL_COMMUNITY)
Admission: EM | Admit: 2015-08-10 | Discharge: 2015-08-11 | Disposition: A | Discharge status: Home / Self Care | Payer: Medicaid Other | Attending: Emergency Medicine | Admitting: Emergency Medicine

## 2015-08-10 ENCOUNTER — Encounter (HOSPITAL_COMMUNITY): Payer: Self-pay | Admitting: Emergency Medicine

## 2015-08-10 DIAGNOSIS — Z4800 Encounter for change or removal of nonsurgical wound dressing: Secondary | ICD-10-CM | POA: Diagnosis present

## 2015-08-10 DIAGNOSIS — F17210 Nicotine dependence, cigarettes, uncomplicated: Secondary | ICD-10-CM | POA: Diagnosis not present

## 2015-08-10 DIAGNOSIS — T300 Burn of unspecified body region, unspecified degree: Secondary | ICD-10-CM

## 2015-08-10 DIAGNOSIS — Y93G3 Activity, cooking and baking: Secondary | ICD-10-CM | POA: Diagnosis not present

## 2015-08-10 DIAGNOSIS — Z791 Long term (current) use of non-steroidal anti-inflammatories (NSAID): Secondary | ICD-10-CM | POA: Diagnosis not present

## 2015-08-10 DIAGNOSIS — Z79899 Other long term (current) drug therapy: Secondary | ICD-10-CM | POA: Diagnosis not present

## 2015-08-10 DIAGNOSIS — X12XXXD Contact with other hot fluids, subsequent encounter: Secondary | ICD-10-CM | POA: Insufficient documentation

## 2015-08-10 DIAGNOSIS — Y998 Other external cause status: Secondary | ICD-10-CM | POA: Diagnosis not present

## 2015-08-10 DIAGNOSIS — Z7984 Long term (current) use of oral hypoglycemic drugs: Secondary | ICD-10-CM | POA: Insufficient documentation

## 2015-08-10 DIAGNOSIS — Y9289 Other specified places as the place of occurrence of the external cause: Secondary | ICD-10-CM | POA: Insufficient documentation

## 2015-08-10 DIAGNOSIS — I10 Essential (primary) hypertension: Secondary | ICD-10-CM | POA: Insufficient documentation

## 2015-08-10 DIAGNOSIS — T23002D Burn of unspecified degree of left hand, unspecified site, subsequent encounter: Secondary | ICD-10-CM | POA: Diagnosis not present

## 2015-08-10 DIAGNOSIS — E119 Type 2 diabetes mellitus without complications: Secondary | ICD-10-CM | POA: Insufficient documentation

## 2015-08-10 DIAGNOSIS — F319 Bipolar disorder, unspecified: Secondary | ICD-10-CM | POA: Diagnosis not present

## 2015-08-10 DIAGNOSIS — Z87828 Personal history of other (healed) physical injury and trauma: Secondary | ICD-10-CM | POA: Insufficient documentation
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
## 2015-08-11 MED ORDER — SILVER SULFADIAZINE 1 % EX CREA
TOPICAL_CREAM | Freq: Two times a day (BID) | CUTANEOUS | Status: DC
  Administered 2015-08-11: via TOPICAL
  Filled 2015-08-11: qty 50

## 2015-08-11 NOTE — Discharge Instructions (Signed)
Burn Care °Your skin is a natural barrier to infection. It is the largest organ of your body. Burns damage this natural protection. To help prevent infection, it is very important to follow your caregiver's instructions in the care of your burn. °Burns are classified as: °· First degree. There is only redness of the skin (erythema). No scarring is expected. °· Second degree. There is blistering of the skin. Scarring may occur with deeper burns. °· Third degree. All layers of the skin are injured, and scarring is expected. °HOME CARE INSTRUCTIONS  °· Wash your hands well before changing your bandage. °· Change your bandage as often as directed by your caregiver. °¨ Remove the old bandage. If the bandage sticks, you may soak it off with cool, clean water. °¨ Cleanse the burn thoroughly but gently with mild soap and water. °¨ Pat the area dry with a clean, dry cloth. °¨ Apply a thin layer of antibacterial cream to the burn. °¨ Apply a clean bandage as instructed by your caregiver. °¨ Keep the bandage as clean and dry as possible. °· Elevate the affected area for the first 24 hours, then as instructed by your caregiver. °· Only take over-the-counter or prescription medicines for pain, discomfort, or fever as directed by your caregiver. °SEEK IMMEDIATE MEDICAL CARE IF:  °· You develop excessive pain. °· You develop redness, tenderness, swelling, or red streaks near the burn. °· The burned area develops yellowish-white fluid (pus) or a bad smell. °· You have a fever. °MAKE SURE YOU:  °· Understand these instructions. °· Will watch your condition. °· Will get help right away if you are not doing well or get worse. °  °This information is not intended to replace advice given to you by your health care provider. Make sure you discuss any questions you have with your health care provider. °  °Document Released: 07/04/2005 Document Revised: 09/26/2011 Document Reviewed: 11/24/2010 °Elsevier Interactive Patient Education ©2016  [employer] ° °

## 2015-08-13 ENCOUNTER — Encounter (HOSPITAL_COMMUNITY): Payer: Self-pay | Admitting: Emergency Medicine

## 2015-08-13 ENCOUNTER — Emergency Department (HOSPITAL_COMMUNITY)
Admission: EM | Admit: 2015-08-13 | Discharge: 2015-08-13 | Disposition: A | Discharge status: Home / Self Care | Payer: Medicaid Other | Attending: Emergency Medicine | Admitting: Emergency Medicine

## 2015-08-13 DIAGNOSIS — Y9289 Other specified places as the place of occurrence of the external cause: Secondary | ICD-10-CM | POA: Diagnosis not present

## 2015-08-13 DIAGNOSIS — Z7984 Long term (current) use of oral hypoglycemic drugs: Secondary | ICD-10-CM | POA: Diagnosis not present

## 2015-08-13 DIAGNOSIS — I10 Essential (primary) hypertension: Secondary | ICD-10-CM | POA: Diagnosis not present

## 2015-08-13 DIAGNOSIS — F17210 Nicotine dependence, cigarettes, uncomplicated: Secondary | ICD-10-CM | POA: Insufficient documentation

## 2015-08-13 DIAGNOSIS — Z791 Long term (current) use of non-steroidal anti-inflammatories (NSAID): Secondary | ICD-10-CM | POA: Diagnosis not present

## 2015-08-13 DIAGNOSIS — Y999 Unspecified external cause status: Secondary | ICD-10-CM | POA: Insufficient documentation

## 2015-08-13 DIAGNOSIS — Z79899 Other long term (current) drug therapy: Secondary | ICD-10-CM | POA: Insufficient documentation

## 2015-08-13 DIAGNOSIS — T23202A Burn of second degree of left hand, unspecified site, initial encounter: Secondary | ICD-10-CM | POA: Diagnosis not present

## 2015-08-13 DIAGNOSIS — X101XXA Contact with hot food, initial encounter: Secondary | ICD-10-CM | POA: Insufficient documentation

## 2015-08-13 DIAGNOSIS — Z87828 Personal history of other (healed) physical injury and trauma: Secondary | ICD-10-CM | POA: Diagnosis not present

## 2015-08-13 DIAGNOSIS — T300 Burn of unspecified body region, unspecified degree: Secondary | ICD-10-CM

## 2015-08-13 DIAGNOSIS — F3131 Bipolar disorder, current episode depressed, mild: Secondary | ICD-10-CM | POA: Insufficient documentation

## 2015-08-13 DIAGNOSIS — E119 Type 2 diabetes mellitus without complications: Secondary | ICD-10-CM | POA: Diagnosis not present

## 2015-08-13 DIAGNOSIS — Y9389 Activity, other specified: Secondary | ICD-10-CM | POA: Insufficient documentation
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
## 2015-08-13 NOTE — Discharge Instructions (Signed)
Continue dressing changes at home.  Monitor for signs of infection. Follow-up with the wound care center.

## 2015-08-14 ENCOUNTER — Encounter (HOSPITAL_COMMUNITY): Payer: Self-pay | Admitting: Emergency Medicine

## 2015-08-14 ENCOUNTER — Emergency Department (HOSPITAL_COMMUNITY)
Admission: EM | Admit: 2015-08-14 | Discharge: 2015-08-14 | Disposition: A | Discharge status: Home / Self Care | Payer: Medicaid Other | Attending: Emergency Medicine | Admitting: Emergency Medicine

## 2015-08-14 DIAGNOSIS — F17210 Nicotine dependence, cigarettes, uncomplicated: Secondary | ICD-10-CM | POA: Insufficient documentation

## 2015-08-14 DIAGNOSIS — F319 Bipolar disorder, unspecified: Secondary | ICD-10-CM | POA: Diagnosis not present

## 2015-08-14 DIAGNOSIS — Z79899 Other long term (current) drug therapy: Secondary | ICD-10-CM | POA: Diagnosis not present

## 2015-08-14 DIAGNOSIS — Z7984 Long term (current) use of oral hypoglycemic drugs: Secondary | ICD-10-CM | POA: Insufficient documentation

## 2015-08-14 DIAGNOSIS — Z791 Long term (current) use of non-steroidal anti-inflammatories (NSAID): Secondary | ICD-10-CM | POA: Insufficient documentation

## 2015-08-14 DIAGNOSIS — E119 Type 2 diabetes mellitus without complications: Secondary | ICD-10-CM | POA: Insufficient documentation

## 2015-08-14 DIAGNOSIS — M79642 Pain in left hand: Secondary | ICD-10-CM | POA: Diagnosis present

## 2015-08-14 DIAGNOSIS — I10 Essential (primary) hypertension: Secondary | ICD-10-CM | POA: Insufficient documentation

## 2015-08-14 DIAGNOSIS — T23202D Burn of second degree of left hand, unspecified site, subsequent encounter: Secondary | ICD-10-CM

## 2015-08-14 DIAGNOSIS — T23262D Burn of second degree of back of left hand, subsequent encounter: Secondary | ICD-10-CM | POA: Insufficient documentation

## 2015-08-14 DIAGNOSIS — X101XXD Contact with hot food, subsequent encounter: Secondary | ICD-10-CM | POA: Insufficient documentation

## 2015-08-14 MED ORDER — IBUPROFEN 800 MG PO TABS: 800.0000 mg | ORAL_TABLET | Freq: Three times a day (TID) | ORAL | Status: DC

## 2015-08-14 MED ORDER — SILVER SULFADIAZINE 1 % EX CREA
TOPICAL_CREAM | Freq: Once | CUTANEOUS | Status: AC
  Administered 2015-08-14: 02:00:00 via TOPICAL
  Filled 2015-08-14: qty 50

## 2015-08-14 MED ORDER — SILVER SULFADIAZINE 1 % EX CREA
1.0000 | TOPICAL_CREAM | Freq: Every day | CUTANEOUS | Status: DC
Start: 2015-08-14 — End: 2017-10-23

## 2015-08-14 MED ORDER — IBUPROFEN 800 MG PO TABS
800.0000 mg | ORAL_TABLET | Freq: Once | ORAL | Status: AC
  Administered 2015-08-14: 800 mg via ORAL
  Filled 2015-08-14: qty 1

## 2015-08-14 MED ORDER — SILVER SULFADIAZINE 1 % EX CREA: TOPICAL_CREAM | Freq: Once | CUTANEOUS | Status: DC
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
## 2015-08-19 ENCOUNTER — Emergency Department (HOSPITAL_COMMUNITY)
Admission: EM | Admit: 2015-08-19 | Discharge: 2015-08-19 | Disposition: A | Discharge status: Home / Self Care | Payer: Medicaid Other | Attending: Emergency Medicine | Admitting: Emergency Medicine

## 2015-08-19 ENCOUNTER — Encounter (HOSPITAL_COMMUNITY): Payer: Self-pay

## 2015-08-19 DIAGNOSIS — Z5321 Procedure and treatment not carried out due to patient leaving prior to being seen by health care provider: Secondary | ICD-10-CM

## 2015-08-19 DIAGNOSIS — Y9289 Other specified places as the place of occurrence of the external cause: Secondary | ICD-10-CM | POA: Diagnosis not present

## 2015-08-19 DIAGNOSIS — Y9389 Activity, other specified: Secondary | ICD-10-CM | POA: Diagnosis not present

## 2015-08-19 DIAGNOSIS — Y998 Other external cause status: Secondary | ICD-10-CM | POA: Insufficient documentation

## 2015-08-19 DIAGNOSIS — I10 Essential (primary) hypertension: Secondary | ICD-10-CM | POA: Insufficient documentation

## 2015-08-19 DIAGNOSIS — Y279XXA Contact with unspecified hot objects, undetermined intent, initial encounter: Secondary | ICD-10-CM | POA: Diagnosis not present

## 2015-08-19 DIAGNOSIS — E119 Type 2 diabetes mellitus without complications: Secondary | ICD-10-CM | POA: Diagnosis not present

## 2015-08-19 DIAGNOSIS — F17210 Nicotine dependence, cigarettes, uncomplicated: Secondary | ICD-10-CM | POA: Diagnosis not present

## 2015-08-19 DIAGNOSIS — T23002A Burn of unspecified degree of left hand, unspecified site, initial encounter: Secondary | ICD-10-CM | POA: Insufficient documentation

## 2015-08-19 DIAGNOSIS — F10129 Alcohol abuse with intoxication, unspecified: Secondary | ICD-10-CM | POA: Insufficient documentation
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
## 2015-08-19 NOTE — Progress Notes (Addendum)
pcp is [facility] [address] [place], [place] [address] [phone]   Entered in d/c instructions pcp is [facility]  Schedule an appointment as soon as possible for a visit This is your assigned Medicaid [place] access doctor If you prefer another contact DSS DSS assigned your doctor *You may receive a bill if you go to any family Dr not assigned to you pcp is [facility] [address] [place], [place] [address] [phone]   Medicaid [place] Access Covered Patient   [place]: [phone] [address] [place], [place] [address] [email or web address] Use this website to assist with understanding your coverage & to renew application As a Medicaid client you MUST contact DSS/SSI each time you change address, move to another [place] county or another state to keep your address updated Charles Leon Medicaid Transportation to Dr appts if you are have full Medicaid: [phone], [phone]

## 2015-08-19 NOTE — ED Notes (Signed)
No response when called Pt for a room.  Upon initial arrival, EMS reported that they were confident that the Pt would leave.

## 2015-08-20 ENCOUNTER — Emergency Department (HOSPITAL_COMMUNITY)
Admission: EM | Admit: 2015-08-20 | Discharge: 2015-08-20 | Disposition: A | Discharge status: Home / Self Care | Payer: Medicaid Other | Attending: Emergency Medicine | Admitting: Emergency Medicine

## 2015-08-20 ENCOUNTER — Encounter (HOSPITAL_COMMUNITY): Payer: Self-pay | Admitting: Emergency Medicine

## 2015-08-20 ENCOUNTER — Emergency Department (HOSPITAL_COMMUNITY): Payer: Medicaid Other

## 2015-08-20 DIAGNOSIS — Z791 Long term (current) use of non-steroidal anti-inflammatories (NSAID): Secondary | ICD-10-CM | POA: Diagnosis not present

## 2015-08-20 DIAGNOSIS — Y998 Other external cause status: Secondary | ICD-10-CM | POA: Diagnosis not present

## 2015-08-20 DIAGNOSIS — S0990XA Unspecified injury of head, initial encounter: Secondary | ICD-10-CM | POA: Diagnosis not present

## 2015-08-20 DIAGNOSIS — Y9289 Other specified places as the place of occurrence of the external cause: Secondary | ICD-10-CM | POA: Diagnosis not present

## 2015-08-20 DIAGNOSIS — Y9389 Activity, other specified: Secondary | ICD-10-CM | POA: Insufficient documentation

## 2015-08-20 DIAGNOSIS — S4992XA Unspecified injury of left shoulder and upper arm, initial encounter: Secondary | ICD-10-CM | POA: Diagnosis not present

## 2015-08-20 DIAGNOSIS — E119 Type 2 diabetes mellitus without complications: Secondary | ICD-10-CM | POA: Diagnosis not present

## 2015-08-20 DIAGNOSIS — Z79899 Other long term (current) drug therapy: Secondary | ICD-10-CM | POA: Insufficient documentation

## 2015-08-20 DIAGNOSIS — Z7984 Long term (current) use of oral hypoglycemic drugs: Secondary | ICD-10-CM | POA: Diagnosis not present

## 2015-08-20 DIAGNOSIS — S4991XA Unspecified injury of right shoulder and upper arm, initial encounter: Secondary | ICD-10-CM | POA: Insufficient documentation

## 2015-08-20 DIAGNOSIS — Z792 Long term (current) use of antibiotics: Secondary | ICD-10-CM | POA: Insufficient documentation

## 2015-08-20 DIAGNOSIS — F319 Bipolar disorder, unspecified: Secondary | ICD-10-CM | POA: Diagnosis not present

## 2015-08-20 DIAGNOSIS — F17210 Nicotine dependence, cigarettes, uncomplicated: Secondary | ICD-10-CM | POA: Diagnosis not present

## 2015-08-20 DIAGNOSIS — I10 Essential (primary) hypertension: Secondary | ICD-10-CM | POA: Insufficient documentation

## 2015-08-20 LAB — URINALYSIS, ROUTINE W REFLEX MICROSCOPIC
BILIRUBIN URINE: NEGATIVE
GLUCOSE, UA: 500 mg/dL — AB
Hgb urine dipstick: NEGATIVE
KETONES UR: NEGATIVE mg/dL
Leukocytes, UA: NEGATIVE
Nitrite: NEGATIVE
PH: 6 (ref 5.0–8.0)
Protein, ur: 30 mg/dL — AB
SPECIFIC GRAVITY, URINE: 1.025 (ref 1.005–1.030)

## 2015-08-20 LAB — URINE MICROSCOPIC-ADD ON: RBC / HPF: NONE SEEN RBC/hpf (ref 0–5)

## 2015-08-20 LAB — CBC WITH DIFFERENTIAL/PLATELET
BASOS ABS: 0 10*3/uL (ref 0.0–0.1)
BASOS PCT: 0 %
EOS PCT: 2 %
Eosinophils Absolute: 0.2 10*3/uL (ref 0.0–0.7)
HEMATOCRIT: 39.2 % (ref 39.0–52.0)
Hemoglobin: 13.3 g/dL (ref 13.0–17.0)
Lymphocytes Relative: 27 %
Lymphs Abs: 2.8 10*3/uL (ref 0.7–4.0)
MCH: 27.9 pg (ref 26.0–34.0)
MCHC: 33.9 g/dL (ref 30.0–36.0)
MCV: 82.2 fL (ref 78.0–100.0)
MONO ABS: 0.7 10*3/uL (ref 0.1–1.0)
MONOS PCT: 7 %
Neutro Abs: 6.6 10*3/uL (ref 1.7–7.7)
Neutrophils Relative %: 64 %
PLATELETS: 296 10*3/uL (ref 150–400)
RBC: 4.77 MIL/uL (ref 4.22–5.81)
RDW: 14.2 % (ref 11.5–15.5)
WBC: 10.3 10*3/uL (ref 4.0–10.5)

## 2015-08-20 LAB — COMPREHENSIVE METABOLIC PANEL
ALBUMIN: 2.8 g/dL — AB (ref 3.5–5.0)
ALK PHOS: 77 U/L (ref 38–126)
ALT: 21 U/L (ref 17–63)
AST: 29 U/L (ref 15–41)
Anion gap: 8 (ref 5–15)
BILIRUBIN TOTAL: 0.4 mg/dL (ref 0.3–1.2)
BUN: 10 mg/dL (ref 6–20)
CALCIUM: 8.8 mg/dL — AB (ref 8.9–10.3)
CO2: 24 mmol/L (ref 22–32)
Chloride: 109 mmol/L (ref 101–111)
Creatinine, Ser: 0.96 mg/dL (ref 0.61–1.24)
GFR calc Af Amer: >60 mL/min (ref >60)
GFR calc non Af Amer: >60 mL/min (ref >60)
GLUCOSE: 233 mg/dL — AB (ref 65–99)
Potassium: 3.5 mmol/L (ref 3.5–5.1)
Sodium: 141 mmol/L (ref 135–145)
TOTAL PROTEIN: 6.3 g/dL — AB (ref 6.5–8.1)

## 2015-08-20 LAB — ETHANOL

## 2015-08-20 LAB — RAPID URINE DRUG SCREEN, HOSP PERFORMED
Amphetamines: NOT DETECTED
BARBITURATES: NOT DETECTED
BENZODIAZEPINES: NOT DETECTED
Cocaine: NOT DETECTED
Opiates: NOT DETECTED
TETRAHYDROCANNABINOL: NOT DETECTED

## 2015-08-20 LAB — LIPASE, BLOOD: Lipase: 35 U/L (ref 11–51)

## 2015-08-20 MED ORDER — SODIUM CHLORIDE 0.9 % IV BOLUS (SEPSIS)
1000.0000 mL | Freq: Once | INTRAVENOUS | Status: AC
  Administered 2015-08-20: 1000 mL via INTRAVENOUS
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
## 2015-08-22 ENCOUNTER — Emergency Department (HOSPITAL_COMMUNITY)
Admission: EM | Admit: 2015-08-22 | Discharge: 2015-08-22 | Disposition: A | Discharge status: Home / Self Care | Payer: Medicaid Other | Attending: Emergency Medicine | Admitting: Emergency Medicine

## 2015-08-22 ENCOUNTER — Encounter (HOSPITAL_COMMUNITY): Payer: Self-pay | Admitting: Emergency Medicine

## 2015-08-22 DIAGNOSIS — F17210 Nicotine dependence, cigarettes, uncomplicated: Secondary | ICD-10-CM | POA: Diagnosis not present

## 2015-08-22 DIAGNOSIS — E119 Type 2 diabetes mellitus without complications: Secondary | ICD-10-CM | POA: Insufficient documentation

## 2015-08-22 DIAGNOSIS — W1839XA Other fall on same level, initial encounter: Secondary | ICD-10-CM | POA: Diagnosis not present

## 2015-08-22 DIAGNOSIS — Y998 Other external cause status: Secondary | ICD-10-CM | POA: Diagnosis not present

## 2015-08-22 DIAGNOSIS — R55 Syncope and collapse: Secondary | ICD-10-CM | POA: Diagnosis present

## 2015-08-22 DIAGNOSIS — Y9289 Other specified places as the place of occurrence of the external cause: Secondary | ICD-10-CM | POA: Diagnosis not present

## 2015-08-22 DIAGNOSIS — Z7951 Long term (current) use of inhaled steroids: Secondary | ICD-10-CM | POA: Diagnosis not present

## 2015-08-22 DIAGNOSIS — Z043 Encounter for examination and observation following other accident: Secondary | ICD-10-CM | POA: Diagnosis not present

## 2015-08-22 DIAGNOSIS — F319 Bipolar disorder, unspecified: Secondary | ICD-10-CM | POA: Diagnosis not present

## 2015-08-22 DIAGNOSIS — Y9389 Activity, other specified: Secondary | ICD-10-CM | POA: Insufficient documentation

## 2015-08-22 DIAGNOSIS — W19XXXA Unspecified fall, initial encounter: Secondary | ICD-10-CM

## 2015-08-22 DIAGNOSIS — I10 Essential (primary) hypertension: Secondary | ICD-10-CM | POA: Diagnosis not present

## 2015-08-22 DIAGNOSIS — Z79899 Other long term (current) drug therapy: Secondary | ICD-10-CM | POA: Insufficient documentation

## 2015-08-22 MED ORDER — CEFAZOLIN SODIUM-DEXTROSE 2-3 GM-% IV SOLR: 2.0000 g | Freq: Once | INTRAVENOUS | Status: DC

## 2015-08-22 NOTE — ED Provider Notes (Signed)
CSN: [id number]     Arrival date & time 08/22/15  0802 History   First MD Initiated Contact with Patient 08/22/15 0825     Chief Complaint  Patient presents with  . Loss of Consciousness     (Consider location/radiation/quality/duration/timing/severity/associated sxs/prior Treatment) HPI 55 year old male who presents with concern for loss of consciousness. Although this is his CC, he denies having any LOC.  History of hypertension, diabetes, and bipolar disorder. States that he was walking to the magistrate's office and 5:30 AM, when he tripped on a crack in the sidewalk. Needs to ambulate with cane, he states he was not doing today. States he has frequent falls when he does not ambulate with walker. This is not unusual for him. Denies any syncope prior to his fall, or near syncopal symptoms. Denies any associating shortness of breath or chest pain prior to fall. Stated that he had fallen backwards onto his backpack and did not hit his head. Stated that he was very tired and subsequently decided to take a nap. Passerby's saw him lying on the sidewalk, and EMS was subsequently called. He was easily woken up and brought him to the ED. According to EMS, they often do find him sleeping on the side of the road around the sidewalk, this is not unusual finding for him. He states that he is otherwise in his usual state of health. Denies any headache, vision or speech changes, chest pain or difficulty breathing, abdominal pain, back pain, or any injuries.   Past Medical History  Diagnosis Date  . Lumbar spinal cord injury (HCC) 2012  . Bipolar disorder (HCC)   . Depression   . Hypertension   . Diabetes mellitus without complication [facility])    Past Surgical History  Procedure Laterality Date  . Back surgery    . Eye surgery     History reviewed. No pertinent family history. Social History  Substance Use Topics  . Smoking status: Current Every Day Smoker    Types: Cigarettes  . Smokeless tobacco:  None  . Alcohol Use: Yes     Comment: "Occasional"     Review of Systems 10/14 systems reviewed and are negative other than those stated in the HPI    Allergies  Haldol  Home Medications   Prior to Admission medications   Medication Sig Start Date End Date Taking? Authorizing Provider  cyclobenzaprine (FLEXERIL) 10 MG tablet Take 1 tablet (10 mg total) by mouth 2 (two) times daily as needed for muscle spasms. 07/29/15   Elpidio Anis, PA-C  ibuprofen (ADVIL,MOTRIN) 800 MG tablet Take 1 tablet (800 mg total) by mouth 3 (three) times daily. 08/14/15   Dione Booze, MD  lisinopril (PRINIVIL,ZESTRIL) 20 MG tablet Take 20 mg by mouth daily.    Historical Provider, MD  meloxicam (MOBIC) 7.5 MG tablet Take 2 tablets (15 mg total) by mouth daily. 07/26/15   Antony Madura, PA-C  metFORMIN (GLUCOPHAGE) 500 MG tablet Take 500 mg by mouth 2 (two) times daily with a meal.     Historical Provider, MD  risperiDONE (RISPERDAL M-TABS) 1 MG disintegrating tablet Take 1 tablet (1 mg total) by mouth 2 (two) times daily. 07/28/15   Earney Navy, NP  silver sulfADIAZINE (SILVADENE) 1 % cream Apply 1 application topically daily. 08/14/15   Dione Booze, MD  tamsulosin (FLOMAX) 0.4 MG CAPS capsule Take 0.4 mg by mouth daily.    Historical Provider, MD  traZODone (DESYREL) 50 MG tablet Take 1 tablet (50 mg total) by  mouth at bedtime. 07/28/15   Earney Navy, NP   BP 126/86 mmHg  Pulse 88  Temp(Src) 97.9 F (36.6 C) (Oral)  Resp 16  SpO2 99% Physical Exam Physical Exam  Nursing note and vitals reviewed. Constitutional: Well developed, well nourished, non-toxic, and in no acute distress Head: Normocephalic and atraumatic.  Mouth/Throat: Oropharynx is clear and moist.  Neck: Normal range of motion. Neck supple.  Cardiovascular: Normal rate and regular rhythm.   Pulmonary/Chest: Effort normal and breath sounds normal.  Abdominal: Soft. There is no tenderness. There is no rebound and no guarding.   Musculoskeletal: Normal range of motion.  Neurological: Alert, no facial droop, fluent speech, moves all extremities symmetrically Skin: Skin is warm and dry.  Psychiatric: Cooperative, no SI/HI, no AVH, does not respond to internal stimuli  ED Course  Procedures (including critical care time) Labs Review Labs Reviewed - No data to display  Imaging Review No results found. I have personally reviewed and evaluated these images and lab results as part of my medical decision-making.   EKG Interpretation None      MDM   Final diagnoses:  Fall, initial encounter    55 year old male who presents after fall. On presentation is well-appearing and in no acute distress, is alert, oriented, and appropriate. No evidence of head injury or any other serious injury on exam. States that he is at his baseline health, and denies any LOC or syncope. Not unusual behavior for him, he states. Felt appropriate for discharge home. Strict return and follow-up instructions reviewed. He expressed understanding of all discharge instructions and felt comfortable with the plan of care.     Lavera Guise, MD 08/22/15 [phone]

## 2015-08-22 NOTE — Discharge Instructions (Signed)
Return for worsening symptoms, including severe headaches, confusion, vomiting and unable to keep down food/fluids, passing out, chest pain or difficulty breathing, or any other symptoms concerning to you.

## 2015-08-22 NOTE — ED Notes (Signed)
Patient reports he left home at 0530 to walk to magistrate to take a charge out on brother for selling his scooter; states he falls all the time when he has to use his cane. States he tripped and fell into the grass and just went to sleep. Paramedics report they were called at 0720 for a man lying on the sidewalk. They arrived and he woke up to stimulation; he was alert and oriented and walked to ambulance. On arrival to hospital he is alert and oriented x4; denies any injury from fall. Denies being cold; states he had on 5 layers.

## 2015-08-23 ENCOUNTER — Emergency Department (HOSPITAL_COMMUNITY)
Admission: EM | Admit: 2015-08-23 | Discharge: 2015-08-23 | Disposition: A | Discharge status: Home / Self Care | Payer: Medicaid Other | Attending: Emergency Medicine | Admitting: Emergency Medicine

## 2015-08-23 ENCOUNTER — Emergency Department (HOSPITAL_COMMUNITY)
Admission: EM | Admit: 2015-08-23 | Discharge: 2015-08-23 | Disposition: A | Discharge status: Home / Self Care | Payer: Medicaid Other | Source: Home / Self Care | Attending: Physician Assistant | Admitting: Physician Assistant

## 2015-08-23 ENCOUNTER — Encounter (HOSPITAL_COMMUNITY): Payer: Self-pay | Admitting: Family Medicine

## 2015-08-23 ENCOUNTER — Emergency Department (HOSPITAL_COMMUNITY): Payer: Medicaid Other

## 2015-08-23 ENCOUNTER — Encounter (HOSPITAL_COMMUNITY): Payer: Self-pay | Admitting: Emergency Medicine

## 2015-08-23 DIAGNOSIS — W01198A Fall on same level from slipping, tripping and stumbling with subsequent striking against other object, initial encounter: Secondary | ICD-10-CM | POA: Insufficient documentation

## 2015-08-23 DIAGNOSIS — F17210 Nicotine dependence, cigarettes, uncomplicated: Secondary | ICD-10-CM

## 2015-08-23 DIAGNOSIS — Y998 Other external cause status: Secondary | ICD-10-CM

## 2015-08-23 DIAGNOSIS — S59902A Unspecified injury of left elbow, initial encounter: Secondary | ICD-10-CM | POA: Diagnosis not present

## 2015-08-23 DIAGNOSIS — E119 Type 2 diabetes mellitus without complications: Secondary | ICD-10-CM | POA: Insufficient documentation

## 2015-08-23 DIAGNOSIS — Y9389 Activity, other specified: Secondary | ICD-10-CM | POA: Insufficient documentation

## 2015-08-23 DIAGNOSIS — F319 Bipolar disorder, unspecified: Secondary | ICD-10-CM

## 2015-08-23 DIAGNOSIS — Y92480 Sidewalk as the place of occurrence of the external cause: Secondary | ICD-10-CM | POA: Insufficient documentation

## 2015-08-23 DIAGNOSIS — I10 Essential (primary) hypertension: Secondary | ICD-10-CM | POA: Insufficient documentation

## 2015-08-23 DIAGNOSIS — M25522 Pain in left elbow: Secondary | ICD-10-CM

## 2015-08-23 DIAGNOSIS — Z791 Long term (current) use of non-steroidal anti-inflammatories (NSAID): Secondary | ICD-10-CM | POA: Insufficient documentation

## 2015-08-23 DIAGNOSIS — Z79899 Other long term (current) drug therapy: Secondary | ICD-10-CM | POA: Insufficient documentation

## 2015-08-23 DIAGNOSIS — S80212A Abrasion, left knee, initial encounter: Secondary | ICD-10-CM

## 2015-08-23 DIAGNOSIS — Z659 Problem related to unspecified psychosocial circumstances: Secondary | ICD-10-CM

## 2015-08-23 DIAGNOSIS — Y9289 Other specified places as the place of occurrence of the external cause: Secondary | ICD-10-CM | POA: Insufficient documentation

## 2015-08-23 DIAGNOSIS — Z792 Long term (current) use of antibiotics: Secondary | ICD-10-CM | POA: Diagnosis not present

## 2015-08-23 DIAGNOSIS — Z7984 Long term (current) use of oral hypoglycemic drugs: Secondary | ICD-10-CM | POA: Insufficient documentation

## 2015-08-23 DIAGNOSIS — W101XXA Fall (on)(from) sidewalk curb, initial encounter: Secondary | ICD-10-CM | POA: Insufficient documentation

## 2015-08-23 DIAGNOSIS — Z609 Problem related to social environment, unspecified: Secondary | ICD-10-CM

## 2015-08-23 NOTE — ED Provider Notes (Signed)
CSN: [id number]     Arrival date & time 08/23/15  2021 History  By signing my name below, I, [facility], attest that this documentation has been prepared under the direction and in the presence of Arthor Captain, PA-C. Electronically Signed: Randell Patient, ED Scribe. 08/23/2015. 9:32 PM.   Chief Complaint  Patient presents with  . Knee Injury   The history is provided by the patient. No language interpreter was used.   HPI Comments: Charles Leon is a 55 y.o. male with an hx of DM, HTN, bipolar disorder, and depression who presents to the Emergency Department complaining of mildly painful, not bleeding, left knee abrasion that occurred shortly PTA. Patient reports that he was walking towards the bus stop when he fell and scraped his knee on the ground, followed immediately by pain and bleeding. He ambulates normally with a cane. Per medical records, pt has been frequently in the ED over the past 6 months (26 visits in all), most recently earlier today at [facility] for a fall. He denies any other symptoms currently.  Upon discussion, pt states that he presents to the Emergency Department for assistance. He reports that he is hungry, does not have access to food at his home, and is unable to get home tonight.  Past Medical History  Diagnosis Date  . Lumbar spinal cord injury (HCC) 2012  . Bipolar disorder (HCC)   . Depression   . Hypertension   . Diabetes mellitus without complication [facility])    Past Surgical History  Procedure Laterality Date  . Back surgery    . Eye surgery     No family history on file. Social History  Substance Use Topics  . Smoking status: Current Every Day Smoker    Types: Cigarettes  . Smokeless tobacco: None  . Alcohol Use: Yes     Comment: "Occasional"     Review of Systems  Musculoskeletal: Negative for joint swelling.  Skin: Positive for wound (Abrasion to left knee).  Hematological: Does not bruise/bleed easily.    Allergies   Haldol  Home Medications   Prior to Admission medications   Medication Sig Start Date End Date Taking? Authorizing Provider  cyclobenzaprine (FLEXERIL) 10 MG tablet Take 1 tablet (10 mg total) by mouth 2 (two) times daily as needed for muscle spasms. 07/29/15   Elpidio Anis, PA-C  ibuprofen (ADVIL,MOTRIN) 800 MG tablet Take 1 tablet (800 mg total) by mouth 3 (three) times daily. 08/14/15   Dione Booze, MD  lisinopril (PRINIVIL,ZESTRIL) 20 MG tablet Take 20 mg by mouth daily.    Historical Provider, MD  meloxicam (MOBIC) 7.5 MG tablet Take 2 tablets (15 mg total) by mouth daily. 07/26/15   Antony Madura, PA-C  metFORMIN (GLUCOPHAGE) 500 MG tablet Take 500 mg by mouth 2 (two) times daily with a meal.     Historical Provider, MD  risperiDONE (RISPERDAL M-TABS) 1 MG disintegrating tablet Take 1 tablet (1 mg total) by mouth 2 (two) times daily. 07/28/15   Earney Navy, NP  silver sulfADIAZINE (SILVADENE) 1 % cream Apply 1 application topically daily. 08/14/15   Dione Booze, MD  tamsulosin (FLOMAX) 0.4 MG CAPS capsule Take 0.4 mg by mouth daily.    Historical Provider, MD  traZODone (DESYREL) 50 MG tablet Take 1 tablet (50 mg total) by mouth at bedtime. 07/28/15   Earney Navy, NP   BP 112/74 mmHg  Pulse 97  Temp(Src) 97.2 F (36.2 C) (Oral)  Resp 20  SpO2 100% Physical  Exam Physical Exam  Constitutional: Pt appears well-developed and well-nourished. No distress.  Awake, alert, nontoxic appearance  HENT:  Head: Normocephalic and atraumatic.  Mouth/Throat: Oropharynx is clear and moist. No oropharyngeal exudate.  Eyes: Conjunctivae are normal. No scleral icterus.  Neck: Normal range of motion. Neck supple.  Cardiovascular: Normal rate, regular rhythm and intact distal pulses.   Pulmonary/Chest: Effort normal and breath sounds normal. No respiratory distress. Pt has no wheezes.  Equal chest expansion  Abdominal: Soft. Bowel sounds are normal. Pt exhibits no mass. There is no  tenderness. There is no rebound and no guarding.  Musculoskeletal: Normal range of motion. Pt exhibits no edema.   Neurological: Pt is alert.  Speech is clear and goal oriented Moves extremities without ataxia  Skin: Skin is warm and dry. Pt is not diaphoretic.  Psychiatric: Pt has a normal mood and affect.  Nursing note and vitals reviewed.  ED Course  Procedures   DIAGNOSTIC STUDIES: Oxygen Saturation is 100% on RA, normal by my interpretation.    COORDINATION OF CARE: 9:23 PM Will apply dressing to the abrasion. Will provide pt with food and attempt to secure a ride home for pt.  Discussed treatment plan with pt at bedside and pt agreed to plan.  Labs Review Labs Reviewed - No data to display  Imaging Review Dg Elbow Complete Left  08/23/2015  CLINICAL DATA:  Larey Seat on left elbow today. Pain along the posterior aspect of the elbow. EXAM: LEFT ELBOW - COMPLETE 3+ VIEW COMPARISON:  12/03/2012 FINDINGS: The left elbow is located without a fracture. No evidence for an elbow joint effusion. Mild degenerative changes at the coronoid process of the ulna and radial head. Enthesopathic changes at the olecranon. IMPRESSION: Mild degenerative changes in the left elbow. No acute bone abnormality. Electronically Signed   By: Richarda Overlie M.D.   On: 08/23/2015 16:35   I have personally reviewed and evaluated these images and lab results as part of my medical decision-making.   EKG Interpretation None      MDM   Final diagnoses:  Knee abrasion, left, initial encounter  Social problem    Patient with small abrasion to the knee  No sign of joint or bony injury. Ambulatory. Social needs addressed. I personally performed the services described in this documentation, which was scribed in my presence. The recorded information has been reviewed and is accurate.      Arthor Captain, PA-C 08/26/15 1842  Courteney Randall An, MD 08/28/15 [phone]

## 2015-08-23 NOTE — ED Notes (Signed)
Pt was just d/c'ed and tripped at bus stop and scrapped knee on concrete. Pt has small skin tear on left knee and c/o of pain in that knee.

## 2015-08-23 NOTE — ED Notes (Signed)
Declined W/C at D/C and was escorted to lobby by RN. 

## 2015-08-23 NOTE — ED Notes (Signed)
To ED via [facility] to triage-- pt's c/o is tripped over uneven sidewalk, c./o "severe arm pain" able to move arm, no deformity noted.. Small abrasion left knee. ot in triage waiting at present-- ambulatory,.

## 2015-08-23 NOTE — Discharge Instructions (Signed)
Abrasion °An abrasion is a cut or scrape on the outer surface of your skin. An abrasion does not extend through all of the layers of your skin. It is important to care for your abrasion properly to prevent infection. °CAUSES °Most abrasions are caused by falling on or gliding across the ground or another surface. When your skin rubs on something, the outer and inner layer of skin rubs off.  °SYMPTOMS °A cut or scrape is the main symptom of this condition. The scrape may be bleeding, or it may appear red or pink. If there was an associated fall, there may be an underlying bruise. °DIAGNOSIS °An abrasion is diagnosed with a physical exam. °TREATMENT °Treatment for this condition depends on how large and deep the abrasion is. Usually, your abrasion will be cleaned with water and mild soap. This removes any dirt or debris that may be stuck. An antibiotic ointment may be applied to the abrasion to help prevent infection. A bandage (dressing) may be placed on the abrasion to keep it clean. °You may also need a tetanus shot. °HOME CARE INSTRUCTIONS °Medicines °· Take or apply medicines only as directed by your health care provider. °· If you were prescribed an antibiotic ointment, finish all of it even if you start to feel better. °Wound Care °· Clean the wound with mild soap and water 2-3 times per day or as directed by your health care provider. Pat your wound dry with a clean towel. Do not rub it. °· There are many different ways to close and cover a wound. Follow instructions from your health care provider about: °· Wound care. °· Dressing changes and removal. °· Check your wound every day for signs of infection. Watch for: °· Redness, swelling, or pain. °· Fluid, blood, or pus. °General Instructions °· Keep the dressing dry as directed by your health care provider. Do not take baths, swim, use a hot tub, or do anything that would put your wound underwater until your health care provider approves. °· If there is  swelling, raise (elevate) the injured area above the level of your heart while you are sitting or lying down. °· Keep all follow-up visits as directed by your health care provider. This is important. °SEEK MEDICAL CARE IF: °· You received a tetanus shot and you have swelling, severe pain, redness, or bleeding at the injection site. °· Your pain is not controlled with medicine. °· You have increased redness, swelling, or pain at the site of your wound. °SEEK IMMEDIATE MEDICAL CARE IF: °· You have a red streak going away from your wound. °· You have a fever. °· You have fluid, blood, or pus coming from your wound. °· You notice a bad smell coming from your wound or your dressing. °  °This information is not intended to replace advice given to you by your health care provider. Make sure you discuss any questions you have with your health care provider. °  °Document Released: 04/13/2005 Document Revised: 03/25/2015 Document Reviewed: 07/02/2014 °Elsevier Interactive Patient Education ©2016 [employer] ° °Wound Care °Taking care of your wound properly can help to prevent pain and infection. It can also help your wound to heal more quickly.  °HOW TO CARE FOR YOUR WOUND  °· Take or apply over-the-counter and prescription medicines only as told by your health care provider. °· If you were prescribed antibiotic medicine, take or apply it as told by your health care provider. Do not stop using the antibiotic even if your condition improves. °·   Clean the wound each day or as told by your health care provider. °¨ Wash the wound with mild soap and water. °¨ Rinse the wound with water to remove all soap. °¨ Pat the wound dry with a clean towel. Do not rub it. °· There are many different ways to close and cover a wound. For example, a wound can be covered with stitches (sutures), skin glue, or adhesive strips. Follow instructions from your health care provider about: °¨ How to take care of your wound. °¨ When and how you should  change your bandage (dressing). °¨ When you should remove your dressing. °¨ Removing whatever was used to close your wound. °· Check your wound every day for signs of infection. Watch for: °¨ Redness, swelling, or pain. °¨ Fluid, blood, or pus. °· Keep the dressing dry until your health care provider says it can be removed. Do not take baths, swim, use a hot tub, or do anything that would put your wound underwater until your health care provider approves. °· Raise (elevate) the injured area above the level of your heart while you are sitting or lying down. °· Do not scratch or pick at the wound. °· Keep all follow-up visits as told by your health care provider. This is important. °SEEK MEDICAL CARE IF: °· You received a tetanus shot and you have swelling, severe pain, redness, or bleeding at the injection site. °· You have a fever. °· Your pain is not controlled with medicine. °· You have increased redness, swelling, or pain at the site of your wound. °· You have fluid, blood, or pus coming from your wound. °· You notice a bad smell coming from your wound or your dressing. °SEEK IMMEDIATE MEDICAL CARE IF: °· You have a red streak going away from your wound. °  °This information is not intended to replace advice given to you by your health care provider. Make sure you discuss any questions you have with your health care provider. °  °Document Released: 04/12/2008 Document Revised: 11/18/2014 Document Reviewed: 06/30/2014 °Elsevier Interactive Patient Education ©2016 Elsevier Inc. ° °

## 2015-08-23 NOTE — ED Provider Notes (Signed)
CSN: [id number]     Arrival date & time 08/23/15  1432 History  By signing my name below, I, Marisue Humble, attest that this documentation has been prepared under the direction and in the presence of non-physician practitioner, Santiago Glad, PA-C. Electronically Signed: Marisue Humble, Scribe. 08/23/2015. 5:04 PM.    Chief Complaint  Patient presents with  . Fall   The history is provided by the patient. No language interpreter was used.   HPI Comments:  Charles Leon is a 54 y.o. male with PMHx of HTN, DM, and Bipolar brought in by EMS who presents to the Emergency Department complaining of fall on concrete 3 hours ago. Pt states he tripped on uneven sidewalk and fell onto his left elbow. He reports mild, constant pain in left elbow s/p fall. He also notes new bleeding from an old wound on his left hand from injury two weeks ago. He denies taking anything for pain PTA. Pt has ambulated since the incident. Pt was brought in by EMS and evaluated in ED yesterday evening after he was found asleep on the sidewalk; EMS noted they frequently find the pt asleep on the sidewalk. Pt denies any other pain, head trauma, other physical trauma, LOC, numbness, or tingling.  Past Medical History  Diagnosis Date  . Lumbar spinal cord injury (HCC) 2012  . Bipolar disorder (HCC)   . Depression   . Hypertension   . Diabetes mellitus without complication [facility])    Past Surgical History  Procedure Laterality Date  . Back surgery    . Eye surgery     History reviewed. No pertinent family history. Social History  Substance Use Topics  . Smoking status: Current Every Day Smoker    Types: Cigarettes  . Smokeless tobacco: None  . Alcohol Use: Yes     Comment: "Occasional"     Review of Systems  Musculoskeletal: Positive for joint swelling and arthralgias. Negative for gait problem.  Skin: Positive for wound (old wound on left hand).  Neurological: Negative for syncope and numbness.  All other  systems reviewed and are negative.  Allergies  Haldol  Home Medications   Prior to Admission medications   Medication Sig Start Date End Date Taking? Authorizing Provider  cyclobenzaprine (FLEXERIL) 10 MG tablet Take 1 tablet (10 mg total) by mouth 2 (two) times daily as needed for muscle spasms. 07/29/15   Elpidio Anis, PA-C  ibuprofen (ADVIL,MOTRIN) 800 MG tablet Take 1 tablet (800 mg total) by mouth 3 (three) times daily. 08/14/15   Dione Booze, MD  lisinopril (PRINIVIL,ZESTRIL) 20 MG tablet Take 20 mg by mouth daily.    Historical Provider, MD  meloxicam (MOBIC) 7.5 MG tablet Take 2 tablets (15 mg total) by mouth daily. 07/26/15   Antony Madura, PA-C  metFORMIN (GLUCOPHAGE) 500 MG tablet Take 500 mg by mouth 2 (two) times daily with a meal.     Historical Provider, MD  risperiDONE (RISPERDAL M-TABS) 1 MG disintegrating tablet Take 1 tablet (1 mg total) by mouth 2 (two) times daily. 07/28/15   Earney Navy, NP  silver sulfADIAZINE (SILVADENE) 1 % cream Apply 1 application topically daily. 08/14/15   Dione Booze, MD  tamsulosin (FLOMAX) 0.4 MG CAPS capsule Take 0.4 mg by mouth daily.    Historical Provider, MD  traZODone (DESYREL) 50 MG tablet Take 1 tablet (50 mg total) by mouth at bedtime. 07/28/15   Earney Navy, NP   BP 118/78 mmHg  Pulse 103  Temp(Src) 98.6 F (37  C) (Oral)  Resp 22  SpO2 100% Physical Exam  Constitutional: He appears well-developed and well-nourished. No distress.  HENT:  Head: Normocephalic and atraumatic.  Neck: Normal range of motion. Neck supple.  Cardiovascular: Normal rate, regular rhythm and normal heart sounds.   Pulses:      Radial pulses are 2+ on the left side.  Pulmonary/Chest: Effort normal. No respiratory distress.  Musculoskeletal:  No TTP of cervical, thoracic, or lumbar spine; TTP of left elbow; no obvious edema, erythema, bruising, deformity; full ROM left wrist and shoulder.  Neurological: He is alert. Coordination normal.  Distal  sensations intact to left hand  Skin: No rash noted. He is not diaphoretic.  Old wound on dorsal aspect left hand epithelial tissue present; appears to be healing; no active bleeding  Psychiatric: He has a normal mood and affect. His behavior is normal.  Nursing note and vitals reviewed.  ED Course  Procedures  DIAGNOSTIC STUDIES:  Oxygen Saturation is 100% on RA, normal by my interpretation.    COORDINATION OF CARE:  4:51 PM Discussed imaging results. Discussed treatment plan with pt at bedside and pt agreed to plan.  Labs Review Labs Reviewed - No data to display  Imaging Review Dg Elbow Complete Left  08/23/2015  CLINICAL DATA:  Larey Seat on left elbow today. Pain along the posterior aspect of the elbow. EXAM: LEFT ELBOW - COMPLETE 3+ VIEW COMPARISON:  12/03/2012 FINDINGS: The left elbow is located without a fracture. No evidence for an elbow joint effusion. Mild degenerative changes at the coronoid process of the ulna and radial head. Enthesopathic changes at the olecranon. IMPRESSION: Mild degenerative changes in the left elbow. No acute bone abnormality. Electronically Signed   By: Richarda Overlie M.D.   On: 08/23/2015 16:35   I have personally reviewed and evaluated these images as part of my medical decision-making.   EKG Interpretation None     MDM   Final diagnoses:  None  Patient presents today with pain of his left elbow after falling unto his elbow earlier today.  Xray is negative for acute findings.  Patient neurovascularly intact.  Stable for discharge.  Return precautions given.  I personally performed the services described in this documentation, which was scribed in my presence. The recorded information has been reviewed and is accurate.     Santiago Glad, PA-C 08/23/15 1916  Arby Barrette, MD 08/23/15 [phone]

## 2015-08-23 NOTE — ED Notes (Signed)
Pt here for fall today on some concrete. sts trip and fall. sts hurts in elbow.

## 2015-08-24 NOTE — ED Provider Notes (Signed)
Patient left without being seen.   1. Patient left without being seen      Melene Plan, DO 08/24/15 [phone]

## 2015-08-25 ENCOUNTER — Emergency Department (HOSPITAL_COMMUNITY)
Admission: EM | Admit: 2015-08-25 | Discharge: 2015-08-25 | Disposition: A | Discharge status: Home / Self Care | Payer: Medicaid Other | Attending: Emergency Medicine | Admitting: Emergency Medicine

## 2015-08-25 ENCOUNTER — Encounter (HOSPITAL_COMMUNITY): Payer: Self-pay | Admitting: Emergency Medicine

## 2015-08-25 DIAGNOSIS — F17210 Nicotine dependence, cigarettes, uncomplicated: Secondary | ICD-10-CM | POA: Insufficient documentation

## 2015-08-25 DIAGNOSIS — M545 Low back pain: Secondary | ICD-10-CM | POA: Insufficient documentation

## 2015-08-25 DIAGNOSIS — E119 Type 2 diabetes mellitus without complications: Secondary | ICD-10-CM | POA: Insufficient documentation

## 2015-08-25 DIAGNOSIS — I10 Essential (primary) hypertension: Secondary | ICD-10-CM | POA: Insufficient documentation

## 2015-08-25 NOTE — ED Notes (Signed)
Pt. arrived with EMS from [facility] reports low back pain for 4 days , denies injury /no urinary symptoms .

## 2015-08-28 ENCOUNTER — Emergency Department (HOSPITAL_COMMUNITY)
Admission: EM | Admit: 2015-08-28 | Discharge: 2015-08-28 | Disposition: A | Discharge status: Home / Self Care | Payer: Medicaid Other | Attending: Emergency Medicine | Admitting: Emergency Medicine

## 2015-08-28 ENCOUNTER — Encounter (HOSPITAL_COMMUNITY): Payer: Self-pay | Admitting: Emergency Medicine

## 2015-08-28 DIAGNOSIS — I10 Essential (primary) hypertension: Secondary | ICD-10-CM | POA: Insufficient documentation

## 2015-08-28 DIAGNOSIS — Y9289 Other specified places as the place of occurrence of the external cause: Secondary | ICD-10-CM | POA: Diagnosis not present

## 2015-08-28 DIAGNOSIS — Z792 Long term (current) use of antibiotics: Secondary | ICD-10-CM | POA: Insufficient documentation

## 2015-08-28 DIAGNOSIS — Y9389 Activity, other specified: Secondary | ICD-10-CM | POA: Insufficient documentation

## 2015-08-28 DIAGNOSIS — Y998 Other external cause status: Secondary | ICD-10-CM | POA: Insufficient documentation

## 2015-08-28 DIAGNOSIS — Z791 Long term (current) use of non-steroidal anti-inflammatories (NSAID): Secondary | ICD-10-CM | POA: Insufficient documentation

## 2015-08-28 DIAGNOSIS — T730XXA Starvation, initial encounter: Secondary | ICD-10-CM

## 2015-08-28 DIAGNOSIS — Y278XXA Contact with other hot objects, undetermined intent, initial encounter: Secondary | ICD-10-CM | POA: Diagnosis not present

## 2015-08-28 DIAGNOSIS — Z7984 Long term (current) use of oral hypoglycemic drugs: Secondary | ICD-10-CM | POA: Diagnosis not present

## 2015-08-28 DIAGNOSIS — T23202A Burn of second degree of left hand, unspecified site, initial encounter: Secondary | ICD-10-CM | POA: Insufficient documentation

## 2015-08-28 DIAGNOSIS — F319 Bipolar disorder, unspecified: Secondary | ICD-10-CM | POA: Insufficient documentation

## 2015-08-28 DIAGNOSIS — E119 Type 2 diabetes mellitus without complications: Secondary | ICD-10-CM | POA: Insufficient documentation

## 2015-08-28 DIAGNOSIS — F17210 Nicotine dependence, cigarettes, uncomplicated: Secondary | ICD-10-CM | POA: Insufficient documentation

## 2015-08-28 DIAGNOSIS — Z79899 Other long term (current) drug therapy: Secondary | ICD-10-CM | POA: Diagnosis not present

## 2015-08-28 MED ORDER — SILVER SULFADIAZINE 1 % EX CREA
TOPICAL_CREAM | Freq: Once | CUTANEOUS | Status: AC
  Administered 2015-08-28: 19:00:00 via TOPICAL
  Filled 2015-08-28: qty 50
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
## 2015-08-28 NOTE — Progress Notes (Addendum)
[facility] [address] [place], [place] [address] [phone] is pcp   Entered in d/c instructions [facility] Schedule an appointment as soon as possible for a visit on 08/31/2015 As needed This is your assigned Medicaid [place] access doctor If you prefer another contact DSS [phone] DSS assigned your doctor *You may receive a bill if you go to any family Dr not assigned to you [facility] [address] [place], [place] [id number] Medicaid [place] Access Covered Patient [place]: [phone] [address] [place], [place] [address] [email or web address] Use this website to assist with understanding your coverage & to renew application As a Medicaid client you MUST contact DSS/SSI each time you change address, move to another [place] county or another state to keep your address updated [place] Medicaid Transportation to Dr appts if you are have full Medicaid: [phone], [phone]

## 2015-08-28 NOTE — Discharge Instructions (Signed)
Continue with home wound care. Encourage adequate nutrition and hydration. Wear gloves if cold outside. Encourage compliance with home medications. Follow up with primary care provider. Return to the ED if your symptoms worsen.

## 2015-08-28 NOTE — ED Notes (Addendum)
Per EMS, pt was brought in because his hands were cold and he's hungry and he has no money.

## 2015-09-04 ENCOUNTER — Emergency Department (HOSPITAL_COMMUNITY)
Admission: EM | Admit: 2015-09-04 | Discharge: 2015-09-05 | Disposition: A | Discharge status: Home / Self Care | Payer: Medicaid Other | Attending: Emergency Medicine | Admitting: Emergency Medicine

## 2015-09-04 ENCOUNTER — Emergency Department (HOSPITAL_COMMUNITY): Payer: Medicaid Other

## 2015-09-04 ENCOUNTER — Encounter (HOSPITAL_COMMUNITY): Payer: Self-pay | Admitting: Emergency Medicine

## 2015-09-04 DIAGNOSIS — Y9289 Other specified places as the place of occurrence of the external cause: Secondary | ICD-10-CM | POA: Diagnosis not present

## 2015-09-04 DIAGNOSIS — Z043 Encounter for examination and observation following other accident: Secondary | ICD-10-CM | POA: Insufficient documentation

## 2015-09-04 DIAGNOSIS — F17210 Nicotine dependence, cigarettes, uncomplicated: Secondary | ICD-10-CM | POA: Diagnosis not present

## 2015-09-04 DIAGNOSIS — W19XXXA Unspecified fall, initial encounter: Secondary | ICD-10-CM

## 2015-09-04 DIAGNOSIS — Z791 Long term (current) use of non-steroidal anti-inflammatories (NSAID): Secondary | ICD-10-CM | POA: Diagnosis not present

## 2015-09-04 DIAGNOSIS — Z7984 Long term (current) use of oral hypoglycemic drugs: Secondary | ICD-10-CM | POA: Insufficient documentation

## 2015-09-04 DIAGNOSIS — E119 Type 2 diabetes mellitus without complications: Secondary | ICD-10-CM | POA: Insufficient documentation

## 2015-09-04 DIAGNOSIS — Y998 Other external cause status: Secondary | ICD-10-CM | POA: Insufficient documentation

## 2015-09-04 DIAGNOSIS — F10129 Alcohol abuse with intoxication, unspecified: Secondary | ICD-10-CM | POA: Diagnosis present

## 2015-09-04 DIAGNOSIS — F10120 Alcohol abuse with intoxication, uncomplicated: Secondary | ICD-10-CM | POA: Insufficient documentation

## 2015-09-04 DIAGNOSIS — F319 Bipolar disorder, unspecified: Secondary | ICD-10-CM | POA: Insufficient documentation

## 2015-09-04 DIAGNOSIS — I10 Essential (primary) hypertension: Secondary | ICD-10-CM | POA: Diagnosis not present

## 2015-09-04 DIAGNOSIS — Z87828 Personal history of other (healed) physical injury and trauma: Secondary | ICD-10-CM | POA: Insufficient documentation

## 2015-09-04 DIAGNOSIS — Y9389 Activity, other specified: Secondary | ICD-10-CM | POA: Insufficient documentation

## 2015-09-04 DIAGNOSIS — Z79899 Other long term (current) drug therapy: Secondary | ICD-10-CM | POA: Diagnosis not present

## 2015-09-04 DIAGNOSIS — F10920 Alcohol use, unspecified with intoxication, uncomplicated: Secondary | ICD-10-CM

## 2015-09-04 DIAGNOSIS — W108XXA Fall (on) (from) other stairs and steps, initial encounter: Secondary | ICD-10-CM | POA: Insufficient documentation

## 2015-09-04 LAB — BASIC METABOLIC PANEL
ANION GAP: 11 (ref 5–15)
BUN: 12 mg/dL (ref 6–20)
CO2: 22 mmol/L (ref 22–32)
Calcium: 9.6 mg/dL (ref 8.9–10.3)
Chloride: 107 mmol/L (ref 101–111)
Creatinine, Ser: 0.85 mg/dL (ref 0.61–1.24)
GFR calc Af Amer: >60 mL/min (ref >60)
GLUCOSE: 111 mg/dL — AB (ref 65–99)
POTASSIUM: 3.9 mmol/L (ref 3.5–5.1)
Sodium: 140 mmol/L (ref 135–145)

## 2015-09-04 LAB — CBC
HEMATOCRIT: 42.9 % (ref 39.0–52.0)
Hemoglobin: 14.4 g/dL (ref 13.0–17.0)
MCH: 28.1 pg (ref 26.0–34.0)
MCHC: 33.6 g/dL (ref 30.0–36.0)
MCV: 83.6 fL (ref 78.0–100.0)
PLATELETS: 342 10*3/uL (ref 150–400)
RBC: 5.13 MIL/uL (ref 4.22–5.81)
RDW: 14.4 % (ref 11.5–15.5)
WBC: 9.6 10*3/uL (ref 4.0–10.5)

## 2015-09-04 NOTE — ED Notes (Signed)
Upon assessment white paint noted to left posterior head; pt complaint of headache and reports "drank colt 45."

## 2015-09-04 NOTE — ED Notes (Signed)
Pt is able to ambulate effectively with his cane

## 2015-09-04 NOTE — ED Provider Notes (Signed)
CSN: [id number]     Arrival date & time 09/04/15  1606 History   First MD Initiated Contact with Patient 09/04/15 1611     Chief Complaint  Patient presents with  . Fall  . Alcohol Intoxication   HPI Charles Leon is a 55 y.o. male PMH significant for lumbar spinal cord injury, bipolar depression, hypertension, diabetes presenting status post fall. EMS states he was found down by a neighbor and they're unsure of how long he was down. The patient states that he remembers the fall, he tripped over some cable. He states he fell down 3 steps and hit the back of his head on the left side. He does endorse drinking a 24 ounce beer today. He denies fevers, chills, chest pain, shortness of breath, abdominal pain, nausea, vomiting, loss of consciousness, change in bowel or bladder habits, anticoagulant use.  Past Medical History  Diagnosis Date  . Lumbar spinal cord injury (HCC) 2012  . Bipolar disorder (HCC)   . Depression   . Hypertension   . Diabetes mellitus without complication [facility])    Past Surgical History  Procedure Laterality Date  . Back surgery    . Eye surgery     No family history on file. Social History  Substance Use Topics  . Smoking status: Current Every Day Smoker    Types: Cigarettes  . Smokeless tobacco: Not on file  . Alcohol Use: Yes     Comment: "Occasional"     Review of Systems  Ten systems are reviewed and are negative for acute change except as noted in the HPI  Allergies  Haldol  Home Medications   Prior to Admission medications   Medication Sig Start Date End Date Taking? Authorizing Provider  cyclobenzaprine (FLEXERIL) 10 MG tablet Take 1 tablet (10 mg total) by mouth 2 (two) times daily as needed for muscle spasms. 07/29/15   Elpidio Anis, PA-C  ibuprofen (ADVIL,MOTRIN) 800 MG tablet Take 1 tablet (800 mg total) by mouth 3 (three) times daily. 08/14/15   Dione Booze, MD  lisinopril (PRINIVIL,ZESTRIL) 20 MG tablet Take 20 mg by mouth daily.     Historical Provider, MD  meloxicam (MOBIC) 7.5 MG tablet Take 2 tablets (15 mg total) by mouth daily. 07/26/15   Antony Madura, PA-C  metFORMIN (GLUCOPHAGE) 500 MG tablet Take 500 mg by mouth 2 (two) times daily with a meal.     Historical Provider, MD  risperiDONE (RISPERDAL M-TABS) 1 MG disintegrating tablet Take 1 tablet (1 mg total) by mouth 2 (two) times daily. 07/28/15   Earney Navy, NP  silver sulfADIAZINE (SILVADENE) 1 % cream Apply 1 application topically daily. 08/14/15   Dione Booze, MD  tamsulosin (FLOMAX) 0.4 MG CAPS capsule Take 0.4 mg by mouth daily.    Historical Provider, MD  traZODone (DESYREL) 50 MG tablet Take 1 tablet (50 mg total) by mouth at bedtime. 07/28/15   Earney Navy, NP   BP 117/68 mmHg  Pulse 96  Temp(Src) 98 F (36.7 C) (Oral)  Resp 18  SpO2 97% Physical Exam  Constitutional: He is oriented to person, place, and time. He appears well-developed and well-nourished. No distress.  HENT:  Head: Normocephalic.  Right Ear: External ear normal.  Left Ear: External ear normal.  Nose: Nose normal.  Mouth/Throat: Oropharynx is clear and moist. No oropharyngeal exudate.  No hemotympanum. White paint from steps noted on left posterior head.  Eyes: Conjunctivae are normal. Pupils are equal, round, and reactive to light. Right  eye exhibits no discharge. Left eye exhibits no discharge. No scleral icterus.  Neck: No tracheal deviation present.  Cardiovascular: Normal rate, regular rhythm, normal heart sounds and intact distal pulses.  Exam reveals no gallop and no friction rub.   No murmur heard. Pulmonary/Chest: Effort normal and breath sounds normal. No respiratory distress. He has no wheezes. He has no rales. He exhibits no tenderness.  Abdominal: Soft. Bowel sounds are normal. He exhibits no distension and no mass. There is no tenderness. There is no rebound and no guarding.  Musculoskeletal: He exhibits no edema.  Lymphadenopathy:    He has no cervical  adenopathy.  Neurological: He is alert and oriented to person, place, and time. No cranial nerve deficit. Coordination normal.  Skin: Skin is warm and dry. No rash noted. He is not diaphoretic. No erythema.  Psychiatric: He has a normal mood and affect. His behavior is normal.  Nursing note and vitals reviewed.  ED Course  Procedures  Labs Review Labs Reviewed  BASIC METABOLIC PANEL - Abnormal; Notable for the following:    Glucose, Bld 111 (*)    All other components within normal limits  CBC   Imaging Review Dg Cervical Spine Complete  09/04/2015  CLINICAL DATA:  ETOH Lateral, right sided neck pain following fall down steps today Pt stated he tripped and fell down steps and was knocked unconscious. Does not remember details about fall. Previous neck sx EXAM: CERVICAL SPINE - COMPLETE 4+ VIEW COMPARISON:  07/26/2015 FINDINGS: No fracture.  No spondylolisthesis. Status post anterior cervical spine fusion C3-C5. Mature bone extends across fused disc spaces. There is no evidence of loosening of the hardware. There is mild narrowing of C5-C6 and C6-C7 disc spaces with small endplate osteophytes. The soft tissues are unremarkable. There has been no change prior study. IMPRESSION: No fracture or acute finding. Electronically Signed   By: Amie Portland M.D.   On: 09/04/2015 18:05   Ct Head Wo Contrast  09/04/2015  CLINICAL DATA:  55 year old male with unwitnessed fall. Initial encounter. EXAM: CT HEAD WITHOUT CONTRAST TECHNIQUE: Contiguous axial images were obtained from the base of the skull through the vertex without intravenous contrast. COMPARISON:  12/03/2012. FINDINGS: Mild right ethmoid and maxillary sinus mucosal thickening. Other Visualized paranasal sinuses and mastoids are clear. Tympanic cavities and mastoids appear clear. No scalp hematoma identified. There does appear to be mild chronic scalp soft tissue scarring over the occipital protuberance. Calvarium intact. No acute osseous  abnormality identified. Visualized orbit soft tissues are within normal limits. No midline shift, ventriculomegaly, mass effect, evidence of mass lesion, intracranial hemorrhage or evidence of cortically based acute infarction. Gray-white matter differentiation is within normal limits throughout the brain. No suspicious intracranial vascular hyperdensity. IMPRESSION: Normal non contrast appearance of the brain. No acute traumatic injury identified. Electronically Signed   By: Odessa Fleming M.D.   On: 09/04/2015 16:50   I have personally reviewed and evaluated these images and lab results as part of my medical decision-making.  MDM   Final diagnoses:  Fall, initial encounter  Alcohol intoxication, uncomplicated (HCC)   Patient nontoxic appearing, intoxicated, vital signs stable. Status post mechanical fall. Will obtain basic labs and image head and c-spine.  Head CT and cervical spine x-ray unremarkable. CBC and BMP at baseline. Care handoff to Lavada Mesi, PA-C at shift change. Plan is to observe patient until he is clinically sober, ambulates in ED without difficulty and he may be discharged.   Melton Krebs, PA-C 09/05/15  1478  Lavera Guise, MD 09/05/15 [phone]

## 2015-09-04 NOTE — ED Notes (Signed)
PT went to restroom did not get urine sample 

## 2015-09-04 NOTE — ED Notes (Addendum)
Per EMS pt complaint of fall resulting in white paint noted to left posterior head; pt admits to drinking "40 ounce beer" prior to arrival; no blood thinners.

## 2015-09-05 NOTE — ED Notes (Signed)
Pt able to eat meal without difficulty

## 2015-09-05 NOTE — Discharge Instructions (Signed)
Mr. Charles Leon,  Nice meeting you! Please follow-up with your primary care provider. Return to the emergency department if you develop chest pain, shortness of breath, visual changes. Feel better soon!  S. Lane Hacker, PA-C

## 2015-09-05 NOTE — ED Notes (Signed)
Pt given ham sandwich, cheese, apple sauce and water.

## 2015-09-07 ENCOUNTER — Encounter (HOSPITAL_COMMUNITY): Payer: Self-pay | Admitting: Emergency Medicine

## 2015-09-07 ENCOUNTER — Emergency Department (HOSPITAL_COMMUNITY)
Admission: EM | Admit: 2015-09-07 | Discharge: 2015-09-07 | Disposition: A | Discharge status: Home / Self Care | Payer: Medicaid Other | Source: Home / Self Care | Attending: Emergency Medicine | Admitting: Emergency Medicine

## 2015-09-07 ENCOUNTER — Encounter (HOSPITAL_COMMUNITY): Payer: Self-pay

## 2015-09-07 DIAGNOSIS — G8929 Other chronic pain: Secondary | ICD-10-CM | POA: Insufficient documentation

## 2015-09-07 DIAGNOSIS — I10 Essential (primary) hypertension: Secondary | ICD-10-CM | POA: Diagnosis not present

## 2015-09-07 DIAGNOSIS — F3130 Bipolar disorder, current episode depressed, mild or moderate severity, unspecified: Secondary | ICD-10-CM | POA: Diagnosis not present

## 2015-09-07 DIAGNOSIS — Y998 Other external cause status: Secondary | ICD-10-CM | POA: Insufficient documentation

## 2015-09-07 DIAGNOSIS — Z792 Long term (current) use of antibiotics: Secondary | ICD-10-CM | POA: Diagnosis not present

## 2015-09-07 DIAGNOSIS — Z9119 Patient's noncompliance with other medical treatment and regimen: Secondary | ICD-10-CM

## 2015-09-07 DIAGNOSIS — Z79899 Other long term (current) drug therapy: Secondary | ICD-10-CM | POA: Insufficient documentation

## 2015-09-07 DIAGNOSIS — F17210 Nicotine dependence, cigarettes, uncomplicated: Secondary | ICD-10-CM | POA: Insufficient documentation

## 2015-09-07 DIAGNOSIS — F319 Bipolar disorder, unspecified: Secondary | ICD-10-CM | POA: Insufficient documentation

## 2015-09-07 DIAGNOSIS — Z791 Long term (current) use of non-steroidal anti-inflammatories (NSAID): Secondary | ICD-10-CM | POA: Insufficient documentation

## 2015-09-07 DIAGNOSIS — E119 Type 2 diabetes mellitus without complications: Secondary | ICD-10-CM | POA: Insufficient documentation

## 2015-09-07 DIAGNOSIS — S3992XA Unspecified injury of lower back, initial encounter: Secondary | ICD-10-CM | POA: Diagnosis not present

## 2015-09-07 DIAGNOSIS — Y9389 Activity, other specified: Secondary | ICD-10-CM | POA: Insufficient documentation

## 2015-09-07 DIAGNOSIS — W1839XA Other fall on same level, initial encounter: Secondary | ICD-10-CM | POA: Insufficient documentation

## 2015-09-07 DIAGNOSIS — T730XXA Starvation, initial encounter: Secondary | ICD-10-CM | POA: Insufficient documentation

## 2015-09-07 DIAGNOSIS — Z590 Homelessness: Secondary | ICD-10-CM | POA: Insufficient documentation

## 2015-09-07 DIAGNOSIS — Y92039 Unspecified place in apartment as the place of occurrence of the external cause: Secondary | ICD-10-CM | POA: Insufficient documentation

## 2015-09-07 DIAGNOSIS — Z9114 Patient's other noncompliance with medication regimen: Secondary | ICD-10-CM

## 2015-09-07 DIAGNOSIS — Z8659 Personal history of other mental and behavioral disorders: Secondary | ICD-10-CM

## 2015-09-07 LAB — COMPREHENSIVE METABOLIC PANEL
ALK PHOS: 94 U/L (ref 38–126)
ALT: 34 U/L (ref 17–63)
ANION GAP: 9 (ref 5–15)
AST: 54 U/L — ABNORMAL HIGH (ref 15–41)
Albumin: 3.9 g/dL (ref 3.5–5.0)
BILIRUBIN TOTAL: 0.4 mg/dL (ref 0.3–1.2)
BUN: 18 mg/dL (ref 6–20)
CALCIUM: 9 mg/dL (ref 8.9–10.3)
CO2: 24 mmol/L (ref 22–32)
Chloride: 105 mmol/L (ref 101–111)
Creatinine, Ser: 1.02 mg/dL (ref 0.61–1.24)
GLUCOSE: 167 mg/dL — AB (ref 65–99)
Potassium: 4 mmol/L (ref 3.5–5.1)
SODIUM: 138 mmol/L (ref 135–145)
TOTAL PROTEIN: 7.3 g/dL (ref 6.5–8.1)

## 2015-09-07 LAB — CBC
HCT: 42.8 % (ref 39.0–52.0)
HEMOGLOBIN: 14.3 g/dL (ref 13.0–17.0)
MCH: 27.7 pg (ref 26.0–34.0)
MCHC: 33.4 g/dL (ref 30.0–36.0)
MCV: 82.9 fL (ref 78.0–100.0)
Platelets: 340 10*3/uL (ref 150–400)
RBC: 5.16 MIL/uL (ref 4.22–5.81)
RDW: 14.1 % (ref 11.5–15.5)
WBC: 9.1 10*3/uL (ref 4.0–10.5)

## 2015-09-07 LAB — SALICYLATE LEVEL: Salicylate Lvl: <4 mg/dL (ref 2.8–30.0)

## 2015-09-07 LAB — ETHANOL: Alcohol, Ethyl (B): <5 mg/dL (ref <5)

## 2015-09-07 LAB — ACETAMINOPHEN LEVEL: Acetaminophen (Tylenol), Serum: <10 ug/mL — ABNORMAL LOW (ref 10–30)

## 2015-09-07 MED ORDER — TRIHEXYPHENIDYL HCL 2 MG PO TABS
2.0000 mg | ORAL_TABLET | Freq: Once | ORAL | Status: AC
  Administered 2015-09-07: 2 mg via ORAL
  Filled 2015-09-07: qty 1

## 2015-09-07 MED ORDER — ZIPRASIDONE HCL 20 MG PO CAPS
40.0000 mg | ORAL_CAPSULE | Freq: Once | ORAL | Status: AC
  Administered 2015-09-07: 40 mg via ORAL
  Filled 2015-09-07: qty 2

## 2015-09-07 NOTE — Discharge Instructions (Signed)
Follow-up with your ACT team. 
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
## 2015-09-07 NOTE — ED Notes (Signed)
Pt was just discharged from [facility] for bizarre behavior. Spoke to Consulting civil engineer at [employer] concerning pt. Informed pt was found smoking a joint in the room and went outside and smoked. Was given PO dose of geodon at 2030. Pt was kicked out of his apartment today for the bizarre behavior as well.

## 2015-09-07 NOTE — ED Provider Notes (Signed)
I have been by room several times and pt not there. I presume he eloped.   Raeford Razor, MD 09/07/15 1950
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
## 2015-09-08 ENCOUNTER — Emergency Department (HOSPITAL_COMMUNITY)
Admission: EM | Admit: 2015-09-08 | Discharge: 2015-09-08 | Disposition: A | Discharge status: Home / Self Care | Payer: Medicaid Other | Attending: Emergency Medicine | Admitting: Emergency Medicine

## 2015-09-08 ENCOUNTER — Emergency Department (HOSPITAL_COMMUNITY)
Admission: EM | Admit: 2015-09-08 | Discharge: 2015-09-14 | Disposition: A | Discharge status: Home / Self Care | Payer: Medicaid Other | Attending: Emergency Medicine | Admitting: Emergency Medicine

## 2015-09-08 ENCOUNTER — Encounter (HOSPITAL_COMMUNITY): Payer: Self-pay | Admitting: Emergency Medicine

## 2015-09-08 DIAGNOSIS — Z792 Long term (current) use of antibiotics: Secondary | ICD-10-CM | POA: Insufficient documentation

## 2015-09-08 DIAGNOSIS — F3130 Bipolar disorder, current episode depressed, mild or moderate severity, unspecified: Secondary | ICD-10-CM | POA: Insufficient documentation

## 2015-09-08 DIAGNOSIS — F17210 Nicotine dependence, cigarettes, uncomplicated: Secondary | ICD-10-CM | POA: Insufficient documentation

## 2015-09-08 DIAGNOSIS — Z046 Encounter for general psychiatric examination, requested by authority: Secondary | ICD-10-CM | POA: Diagnosis present

## 2015-09-08 DIAGNOSIS — R45851 Suicidal ideations: Secondary | ICD-10-CM | POA: Insufficient documentation

## 2015-09-08 DIAGNOSIS — E119 Type 2 diabetes mellitus without complications: Secondary | ICD-10-CM | POA: Diagnosis not present

## 2015-09-08 DIAGNOSIS — Z87828 Personal history of other (healed) physical injury and trauma: Secondary | ICD-10-CM | POA: Insufficient documentation

## 2015-09-08 DIAGNOSIS — T730XXA Starvation, initial encounter: Secondary | ICD-10-CM

## 2015-09-08 DIAGNOSIS — R451 Restlessness and agitation: Secondary | ICD-10-CM | POA: Diagnosis not present

## 2015-09-08 DIAGNOSIS — G8929 Other chronic pain: Secondary | ICD-10-CM

## 2015-09-08 DIAGNOSIS — Z5900 Homelessness unspecified: Secondary | ICD-10-CM

## 2015-09-08 DIAGNOSIS — M545 Low back pain, unspecified: Secondary | ICD-10-CM

## 2015-09-08 DIAGNOSIS — Z791 Long term (current) use of non-steroidal anti-inflammatories (NSAID): Secondary | ICD-10-CM | POA: Insufficient documentation

## 2015-09-08 DIAGNOSIS — Z79899 Other long term (current) drug therapy: Secondary | ICD-10-CM | POA: Diagnosis not present

## 2015-09-08 DIAGNOSIS — I10 Essential (primary) hypertension: Secondary | ICD-10-CM | POA: Insufficient documentation

## 2015-09-08 DIAGNOSIS — F319 Bipolar disorder, unspecified: Secondary | ICD-10-CM

## 2015-09-08 LAB — CBG MONITORING, ED: GLUCOSE-CAPILLARY: 130 mg/dL — AB (ref 65–99)

## 2015-09-08 LAB — COMPREHENSIVE METABOLIC PANEL
ALBUMIN: 3.7 g/dL (ref 3.5–5.0)
ALK PHOS: 82 U/L (ref 38–126)
ALT: 34 U/L (ref 17–63)
ANION GAP: 9 (ref 5–15)
AST: 44 U/L — ABNORMAL HIGH (ref 15–41)
BUN: 20 mg/dL (ref 6–20)
CALCIUM: 9.1 mg/dL (ref 8.9–10.3)
CO2: 22 mmol/L (ref 22–32)
Chloride: 110 mmol/L (ref 101–111)
Creatinine, Ser: 1.06 mg/dL (ref 0.61–1.24)
GFR calc non Af Amer: >60 mL/min (ref >60)
GLUCOSE: 142 mg/dL — AB (ref 65–99)
POTASSIUM: 4.1 mmol/L (ref 3.5–5.1)
SODIUM: 141 mmol/L (ref 135–145)
TOTAL PROTEIN: 7.1 g/dL (ref 6.5–8.1)
Total Bilirubin: 0.7 mg/dL (ref 0.3–1.2)

## 2015-09-08 LAB — CBC WITH DIFFERENTIAL/PLATELET
Basophils Absolute: 0 10*3/uL (ref 0.0–0.1)
Basophils Relative: 0 %
EOS ABS: 0.3 10*3/uL (ref 0.0–0.7)
EOS PCT: 3 %
HCT: 40.5 % (ref 39.0–52.0)
Hemoglobin: 12.8 g/dL — ABNORMAL LOW (ref 13.0–17.0)
LYMPHS ABS: 2.9 10*3/uL (ref 0.7–4.0)
Lymphocytes Relative: 32 %
MCH: 27.1 pg (ref 26.0–34.0)
MCHC: 31.6 g/dL (ref 30.0–36.0)
MCV: 85.8 fL (ref 78.0–100.0)
MONO ABS: 0.7 10*3/uL (ref 0.1–1.0)
MONOS PCT: 8 %
Neutro Abs: 5.2 10*3/uL (ref 1.7–7.7)
Neutrophils Relative %: 57 %
PLATELETS: 326 10*3/uL (ref 150–400)
RBC: 4.72 MIL/uL (ref 4.22–5.81)
RDW: 14.6 % (ref 11.5–15.5)
WBC: 9 10*3/uL (ref 4.0–10.5)

## 2015-09-08 LAB — ETHANOL: Alcohol, Ethyl (B): <5 mg/dL

## 2015-09-08 MED ORDER — LISINOPRIL 20 MG PO TABS
20.0000 mg | ORAL_TABLET | Freq: Every day | ORAL | Status: DC
  Administered 2015-09-08: 20 mg via ORAL
  Filled 2015-09-08: qty 1

## 2015-09-08 MED ORDER — RISPERIDONE 1 MG PO TBDP: 1.0000 mg | ORAL_TABLET | Freq: Two times a day (BID) | ORAL | Status: DC

## 2015-09-08 MED ORDER — LISINOPRIL 20 MG PO TABS: 20.0000 mg | ORAL_TABLET | Freq: Every day | ORAL | Status: DC

## 2015-09-08 MED ORDER — RISPERIDONE 1 MG PO TBDP
1.0000 mg | ORAL_TABLET | Freq: Two times a day (BID) | ORAL | Status: DC
  Administered 2015-09-08: 1 mg via ORAL
  Filled 2015-09-08: qty 1

## 2015-09-08 MED ORDER — CYCLOBENZAPRINE HCL 10 MG PO TABS: 10.0000 mg | ORAL_TABLET | Freq: Two times a day (BID) | ORAL | Status: DC | PRN

## 2015-09-08 MED ORDER — METFORMIN HCL 500 MG PO TABS
500.0000 mg | ORAL_TABLET | Freq: Two times a day (BID) | ORAL | Status: DC
  Administered 2015-09-08: 500 mg via ORAL
  Filled 2015-09-08: qty 1

## 2015-09-08 MED ORDER — TAMSULOSIN HCL 0.4 MG PO CAPS: 0.4000 mg | ORAL_CAPSULE | Freq: Every day | ORAL | Status: DC

## 2015-09-08 MED ORDER — KETOROLAC TROMETHAMINE 60 MG/2ML IM SOLN
60.0000 mg | Freq: Once | INTRAMUSCULAR | Status: AC
  Administered 2015-09-08: 60 mg via INTRAMUSCULAR
  Filled 2015-09-08: qty 2

## 2015-09-08 MED ORDER — METFORMIN HCL 500 MG PO TABS: 500.0000 mg | ORAL_TABLET | Freq: Two times a day (BID) | ORAL | Status: DC
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
## 2015-09-08 NOTE — ED Notes (Signed)
Pt laying in waiting room chairs, snoring loudly with abdomen exposed. Blanket provided to patient for cover and vital signs reassessed. VSS, NAD

## 2015-09-08 NOTE — ED Notes (Signed)
Provider at the bedside.  
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
## 2015-09-08 NOTE — Discharge Planning (Signed)
ERCM consulted r/t pt homeless issues.  ERCM consulted ERSW to assist pt with homeless shelter and or food pantry facilities.

## 2015-09-08 NOTE — [facility] Counselor (Addendum)
Psych disposition: Per Donell Sievert, PA-C, Pt meets inpatient criteria. Per Clint Bolder, AC, no appropriate beds available tonight. Pt to be observed overnight and will be considered for [facility] bed placement in the morning. TTS will seek placement in the meantime.  Triage nursing staff made aware.

## 2015-09-08 NOTE — ED Notes (Signed)
Per IVC-crisis center took papers out on him because he was making threats

## 2015-09-08 NOTE — Discharge Instructions (Signed)
You have been seen today for assistance with medications and housing. Your lab tests showed no abnormalities. Follow up with PCP as soon as possible for reevaluation. Return to ED should symptoms worsen. Refills of your home medications have been given to you to sustain you until you have access to your apartment.    Emergency Department Resource Guide 1) Find a Doctor and Pay Out of Pocket Although you won't have to find out who is covered by your insurance plan, it is a good idea to ask around and get recommendations. You will then need to call the office and see if the doctor you have chosen will accept you as a new patient and what types of options they offer for patients who are self-pay. Some doctors offer discounts or will set up payment plans for their patients who do not have insurance, but you will need to ask so you aren't surprised when you get to your appointment.  2) Contact Your Local Health Department Not all health departments have doctors that can see patients for sick visits, but many do, so it is worth a call to see if yours does. If you don't know where your local health department is, you can check in your phone book. The CDC also has a tool to help you locate your state's health department, and many state websites also have listings of all of their local health departments.  3) Find a Walk-in Clinic If your illness is not likely to be very severe or complicated, you may want to try a walk in clinic. These are popping up all over the country in pharmacies, drugstores, and shopping centers. They're usually staffed by nurse practitioners or physician assistants that have been trained to treat common illnesses and complaints. They're usually fairly quick and inexpensive. However, if you have serious medical issues or chronic medical problems, these are probably not your best option.  No Primary Care Doctor: - Call Health Connect at  [phone] - they can help you locate a primary care  doctor that  accepts your insurance, provides certain services, etc.[phone] Referral Service- [phone]  Chronic Pain Problems: Organization         Address  Phone   Notes  [facility]  [phone] Patients need to be referred by their primary care doctor.   Medication Assistance: Organization         Address  Phone   Notes  [facility] Medication [facility] [address] [place] [place], [place] [address] [phone] --Must be a resident of [facility] -- Must have NO insurance coverage whatsoever (no Medicaid/ Medicare, etc.) -- The pt. MUST have a primary care doctor that directs their care regularly and follows them in the community   [facility]ist  [phone]   [employer]  [phone]    Agencies that provide inexpensive medical care: Organization         Address  Phone   Notes  [facility]  [phone]   [facility]    [phone]   [facility] [address] [place], [place] [address] [phone]   [facility] [address] [place] [place] [phone]   Planned Parenthood    [phone]   [facility]    [phone]   [facility]  [address], [place] Phone:  [phone], Fax:  [phone] Hours  of Operation:  9 am - 6 pm, M-F.  Also accepts Medicaid/Medicare and self-pay.  [facility] for [place] [place], [address], [place] Phone: [phone], Fax: [phone]. Hours of Operation:  8:30 am - 5:30 pm, M-F.  Also accepts Medicaid and self-pay.  [facility] [place] [address] [place] Phone: [phone]   [place], [place], [place] [phone], Ext. [phone] Mondays & Thursdays: 7-9 AM.  First 15 patients are seen on a first come, first serve basis.    [place]  Providers:  Organization         Address  Phone   Notes  [facility] [address], [place] [phone] Also accepts self-pay patients.  [facility] [address] [place], [place]  [phone]   [place], [address], [place] [phone]   [facility] [address] [place] [phone]   Lucianne Lei [address], [place]   [phone] Only accepts [place] Access [place] patients after they have their name applied to their card.   Self-Pay (no insurance) in [facility]:  Organization         Address  Phone   Notes  Sickle Cell Patients, [facility] [place] [phone]   [facility] [place] [phone]   [facility] [place]  [place], [place], [place] [phone]   [facility]/Dr. Osei-Bonsu  [address] [place] or [address], [place] [phone] Phone number for both [place] and [place] locations is the same.  [facility] [address], [place] [phone]   [facility] [address] [place] or [address] [phone]   [facility] [address], [place] [phone], phone; [phone], fax Sees patients 1st and 3rd Saturday of every month.  Must not qualify for public or private insurance (i.e. Medicaid, Medicare, [place] Health Choice, Veterans' Benefits)  Household income should be no more than 200% of the poverty level The clinic cannot treat you if you are pregnant or think you are pregnant  Sexually transmitted diseases are not treated at the clinic.    Dental Care: Organization         Address  Phone  Notes  [facility] [place] [phone] Accepts children up to age 4 who are enrolled in [place] or [place]; pregnant women with a Medicaid card; and children who have applied for Medicaid or [place] Health Choice, but were declined, whose parents can pay a reduced fee at time of service.  [facility] Department of [facility]  [address], [place] [phone] Accepts children up to age 85 who are enrolled in [place] or [place]; pregnant women with a Medicaid card; and children who have applied for Medicaid or [place] Health Choice, but were declined, whose parents can pay a reduced fee at time of service.  [facility]  [place] [phone] Patients are seen by appointment only. Walk-ins are not accepted. [place] will see  patients 58 years of age and older. Monday - Tuesday (8am-5pm) Most Wednesdays (8:30-5pm) $30 per visit, cash only  [facility] Adult Dental Access PROGRAM  [address], [facility] [phone] Patients are seen by appointment only. Walk-ins are not accepted. [place] will see patients 67 years of age and older. One Wednesday Evening (Monthly: Volunteer Based).  $30 per visit, cash only  [place]  [phone] for adults; Children under age 67, call Graduate Pediatric Dentistry at [phone]. Children aged 34-14, please call [phone] to request a pediatric application.  Dental services are provided in all areas of dental care including fillings, crowns and bridges, complete and partial dentures, implants, gum treatment, root canals, and extractions. Preventive care is also provided. Treatment is provided to both adults and children. Patients are selected via a lottery and there is often a waiting list.   [facility] [address], [place]  [phone] [email or web address]   [facility]  [address] [place], [place] [phone], Ext. [phone] Second and Fourth Thursday of each month, opens at 6:30 AM; Clinic ends at 9 AM.  Patients are seen on a first-come first-served basis, and a limited number are seen during each clinic.   [facility]  [address] Hillard Danker [place], [place] [phone]   Eligibility Requirements You must have lived in [place], [place], or [place] counties for at least the last three months.   You cannot be eligible for state or federal sponsored [employer], including [employer], [place], or [employer].   You generally cannot be eligible for healthcare insurance through your employer.    How to apply: Eligibility screenings are held every Tuesday and Wednesday afternoon from 1:00 pm until 4:00 pm. You do not need an appointment for the interview!  [facility] [address] [place], [place]   [place]  [place] Department  [place]  [phone]    Behavioral Health Resources in the Community: Intensive Outpatient Programs Organization         Address  Phone  Notes  [place]. [address] [place], [place] [phone]   [facility] [address], [place], [place]   ADS: [facility] Drug S[facility] [address] [place], [place]   [place] [address]  [place], [place] or [phone]   Substance Abuse Resources Organization         Address  Phone  Notes  Alcohol and Drug Services  [phone]   [place]  [phone]   [facility] [place]   Chinita Pester  [phone]   Residential & Outpatient Substance Abuse Program  [phone]   Psychological Services Organization         Address  Phone  Notes  [facility]  [place]  [phone]   [place] [address] [place] or [phone]    Mobile Crisis Teams Organization         Address  Phone  Notes  [facility], Mobile Crisis Care Unit  [phone]   [facility]  [address] [place], [place]   [facility] [address] [place] [phone]    Self-Help/Support Groups Organization  Address  Phone             Notes  [place]. of [place] - variety of support groups  [place] Call for more information  Narcotics Anonymous (NA), [facility] [address], [employer] [place]  2 meetings at this location   Special educational needs teacher         Address  Phone  Notes  [facility]AP Residential Treatment [place],    [place]  [phone]   [facility]  [address], [place] 329924, [place], [place]   [place], [place] [phone] Admissions: 8am-3pm M-F  Incentives Substance [place] [address],    [place], [place] [phone]   [facility] [address] [place], [place], [place]   [facility] [address],  [place], [place]   [facility] - Intensive Outpatient [address], [place], [place]   [facility].,  [place], [place] [phone] or [phone]   [facility] Services (RTS) [address] [place], [place] Accepts Medicaid  [facility] [address]  [place] [phone] Substance Abuse/Addiction Treatment   [facility] Organization         Address  Phone  Notes  [facility]  [phone]   Domenic Schwab, PhD [address] Arlis Porta [place], [place]   [phone] or [phone]    [place], [place] [phone]   [facility] [address], [place], [place] [phone] Insurance/Medicaid/sponsorship through [facility] and Families [address], [address] [place]                                    [place], [place] [phone] [place] [address], [place] [phone]    Dr. Adele Schilder  [phone]   [facility] Dept. 1) [address], [place] 2) [place] 3)  [place] [address], [place] [phone]  [phone]   [place] [phone] or [phone] (After Hours)

## 2015-09-08 NOTE — ED Provider Notes (Signed)
CSN: [id number]     Arrival date & time 09/07/15  2301 History   First MD Initiated Contact with Patient 09/08/15 [phone]     Chief Complaint  Patient presents with  . Fall     (Consider location/radiation/quality/duration/timing/severity/associated sxs/prior Treatment) HPI   Charles Leon is a 55 y.o. male, with a history of lumbar spinal cord injury, bipolar, DM, depression, and hypertension, presenting to the ED because he states, "I was discharged from [facility] last night and went home to find out that I got kicked out of my apartment. Pt was evaluated at [facility] following a fall. Pt requested something for his chronic back pain, a warm blanket, and something to eat as he has not eaten in 2 days. Pt rates his back pain at 8/10, achy in nature, nonradiating, and states it is no different than his regular back pain. Pt has not been able to take his medications due to being evicted. Pt denies any additional falls/injuries, N/V, chest pain, abdominal pain, neuro deficits, or any other complaints.   Past Medical History  Diagnosis Date  . Lumbar spinal cord injury (HCC) 2012  . Bipolar disorder (HCC)   . Depression   . Hypertension   . Diabetes mellitus without complication [facility])    Past Surgical History  Procedure Laterality Date  . Back surgery    . Eye surgery     No family history on file. Social History  Substance Use Topics  . Smoking status: Current Every Day Smoker    Types: Cigarettes  . Smokeless tobacco: None  . Alcohol Use: Yes     Comment: "Occasional"     Review of Systems  Constitutional: Negative for fever and chills.  Respiratory: Negative for shortness of breath.   Cardiovascular: Negative for chest pain.  Gastrointestinal: Negative for nausea, vomiting and abdominal pain.  Musculoskeletal: Positive for back pain (Chronic).  Neurological: Negative for dizziness, syncope, weakness and numbness.  All other systems reviewed and are negative.     Allergies   Haldol  Home Medications   Prior to Admission medications   Medication Sig Start Date End Date Taking? Authorizing Provider  cyclobenzaprine (FLEXERIL) 10 MG tablet Take 1 tablet (10 mg total) by mouth 2 (two) times daily as needed for muscle spasms. 07/29/15  Yes Charles Upstill, PA-C  ibuprofen (ADVIL,MOTRIN) 800 MG tablet Take 1 tablet (800 mg total) by mouth 3 (three) times daily. Patient taking differently: Take 800 mg by mouth every 6 (six) hours as needed for moderate pain.  08/14/15  Yes Charles Booze, MD  meloxicam (MOBIC) 7.5 MG tablet Take 2 tablets (15 mg total) by mouth daily. 07/26/15  Yes Charles Madura, PA-C  silver sulfADIAZINE (SILVADENE) 1 % cream Apply 1 application topically daily. 08/14/15  Yes Charles Booze, MD  traZODone (DESYREL) 50 MG tablet Take 1 tablet (50 mg total) by mouth at bedtime. 07/28/15  Yes Charles Navy, NP  lisinopril (PRINIVIL,ZESTRIL) 20 MG tablet Take 1 tablet (20 mg total) by mouth daily. 09/08/15   Charles Mccullum C Charles Auxier, PA-C  metFORMIN (GLUCOPHAGE) 500 MG tablet Take 1 tablet (500 mg total) by mouth 2 (two) times daily with a meal. 09/08/15   Shyah Cadmus C Fender Herder, PA-C  risperiDONE (RISPERIDONE M-TAB) 1 MG disintegrating tablet Take 1 tablet (1 mg total) by mouth 2 (two) times daily. 09/08/15   Charles Elgin C Audray Rumore, PA-C  tamsulosin (FLOMAX) 0.4 MG CAPS capsule Take 1 capsule (0.4 mg total) by mouth daily. 09/08/15   Charles Blondin C  Felice Hope, PA-C   BP 121/82 mmHg  Pulse 91  Temp(Src) 98.3 F (36.8 C) (Oral)  Resp 16  Ht  (1.702 m)  Wt 80.287 kg  BMI 27.72 kg/m2  SpO2 99% Physical Exam  Constitutional: He is oriented to person, place, and time. He appears well-developed and well-nourished. No distress.  HENT:  Head: Normocephalic and atraumatic.  Eyes: Conjunctivae and EOM are normal. Pupils are equal, round, and reactive to light.  Neck: Normal range of motion. Neck supple.  Cardiovascular: Normal rate, regular rhythm, normal heart sounds and intact distal pulses.   Pulmonary/Chest:  Effort normal and breath sounds normal. No respiratory distress.  Abdominal: Soft. Bowel sounds are normal. There is no tenderness. There is no guarding.  Musculoskeletal: He exhibits no edema or tenderness.  Tenderness to the left lumbar musculature. Full ROM in all extremities and spine. No paraspinal tenderness.   Lymphadenopathy:    He has no cervical adenopathy.  Neurological: He is alert and oriented to person, place, and time. He has normal reflexes.  No sensory deficits. Strength 5/5 in all extremities. No gait disturbance. Coordination intact. Cranial nerves III-XII grossly intact. No facial droop.   Skin: Skin is warm and dry. He is not diaphoretic.  Nursing note and vitals reviewed.   ED Course  Procedures (including critical care time) Labs Review Labs Reviewed  CBG MONITORING, ED - Abnormal; Notable for the following:    Glucose-Capillary 130 (*)    All other components within normal limits    Imaging Review No results found. I have personally reviewed and evaluated these lab results as part of my medical decision-making.   EKG Interpretation None        MDM   Final diagnoses:  Hungry, initial encounter  Homeless  Chronic low back pain    Charles Leon presents following an eviction from his apartment. Patient complains of chronic back pain due to being unable to access his medications.  Patient has no acute complaints. Patient confirms that his back pain is chronic and is no different than normal. Patient states that due to his lumbar spine injury he has to lay down and rest frequently and the reason he is here is because he can't get into his apartment, had no place to lay down, and had no access to his medications. Patient has a normal neuro exam and no red flag symptoms. No indication for imaging or labs at this time. Pt was given some pain management, a meal, and a warm blanket, as requested. Case management consult was placed.  9:34 AM Upon reevaluation,  patient states that his pain has sufficiently decreased. Patient is still worried about his housing situation, but has no additional complaints. Social work was able to set this patient up with resources. Patient has no other concerns. Patient was also given prescriptions for most of his medications with instructions to follow-up with her PCP for further refills. Patient fits no admission criteria and appears safe for discharge.  Filed Vitals:   09/08/15 0529 09/08/15 0700 09/08/15 0730 09/08/15 0745  BP: 124/94 135/77 114/66 122/94  Pulse: 96 84 91 92  Temp: 98.2 F (36.8 C) 98.3 F (36.8 C)    TempSrc: Oral Oral    Resp:  24    Height:      Weight:      SpO2: 98% 94% 94% 98%   Filed Vitals:   09/08/15 0730 09/08/15 0745 09/08/15 0928 09/08/15 1000  BP: 114/66 122/94 126/71 121/82  Pulse:  91 92 93 91  Temp:   98.3 F (36.8 C)   TempSrc:   Oral   Resp:   16   Height:      Weight:      SpO2: 94% 98% 95% 99%      Anselm Pancoast, PA-C 09/09/15 1620  Alvira Monday, MD 09/10/15 1246

## 2015-09-08 NOTE — ED Notes (Signed)
Ordered diet tray 

## 2015-09-09 ENCOUNTER — Emergency Department (HOSPITAL_COMMUNITY): Payer: Medicaid Other

## 2015-09-09 DIAGNOSIS — F319 Bipolar disorder, unspecified: Secondary | ICD-10-CM | POA: Diagnosis not present

## 2015-09-09 LAB — RAPID URINE DRUG SCREEN, HOSP PERFORMED
AMPHETAMINES: NOT DETECTED
BENZODIAZEPINES: NOT DETECTED
Barbiturates: NOT DETECTED
Cocaine: NOT DETECTED
Opiates: NOT DETECTED
TETRAHYDROCANNABINOL: NOT DETECTED

## 2015-09-09 LAB — CBG MONITORING, ED: Glucose-Capillary: 114 mg/dL — ABNORMAL HIGH (ref 65–99)

## 2015-09-09 MED ORDER — WHITE PETROLATUM GEL
Status: DC | PRN
  Administered 2015-09-09 – 2015-09-12 (×2): via TOPICAL
  Filled 2015-09-09 (×3): qty 5

## 2015-09-09 MED ORDER — METFORMIN HCL 500 MG PO TABS
500.0000 mg | ORAL_TABLET | Freq: Two times a day (BID) | ORAL | Status: DC
  Administered 2015-09-10 – 2015-09-14 (×9): 500 mg via ORAL
  Filled 2015-09-09 (×12): qty 1

## 2015-09-09 MED ORDER — LORAZEPAM 1 MG PO TABS
2.0000 mg | ORAL_TABLET | Freq: Once | ORAL | Status: AC
  Administered 2015-09-09: 2 mg via ORAL
  Filled 2015-09-09: qty 2

## 2015-09-09 MED ORDER — MELOXICAM 15 MG PO TABS
15.0000 mg | ORAL_TABLET | Freq: Every day | ORAL | Status: DC
  Administered 2015-09-09 – 2015-09-14 (×6): 15 mg via ORAL
  Filled 2015-09-09 (×6): qty 1

## 2015-09-09 MED ORDER — IBUPROFEN 200 MG PO TABS
400.0000 mg | ORAL_TABLET | Freq: Four times a day (QID) | ORAL | Status: DC | PRN
  Administered 2015-09-14 (×2): 400 mg via ORAL
  Filled 2015-09-09 (×2): qty 2

## 2015-09-09 MED ORDER — RISPERIDONE 1 MG PO TBDP
1.0000 mg | ORAL_TABLET | Freq: Two times a day (BID) | ORAL | Status: DC
  Administered 2015-09-09 – 2015-09-14 (×11): 1 mg via ORAL
  Filled 2015-09-09 (×15): qty 1

## 2015-09-09 MED ORDER — TAMSULOSIN HCL 0.4 MG PO CAPS
0.4000 mg | ORAL_CAPSULE | Freq: Every day | ORAL | Status: DC
  Administered 2015-09-09 – 2015-09-14 (×6): 0.4 mg via ORAL
  Filled 2015-09-09 (×6): qty 1

## 2015-09-09 MED ORDER — TRAZODONE HCL 50 MG PO TABS
50.0000 mg | ORAL_TABLET | Freq: Every day | ORAL | Status: DC
  Administered 2015-09-09 – 2015-09-13 (×5): 50 mg via ORAL
  Filled 2015-09-09 (×5): qty 1

## 2015-09-09 MED ORDER — LISINOPRIL 20 MG PO TABS
20.0000 mg | ORAL_TABLET | Freq: Every day | ORAL | Status: DC
  Administered 2015-09-09 – 2015-09-14 (×5): 20 mg via ORAL
  Filled 2015-09-09 (×6): qty 1

## 2015-09-09 NOTE — ED Provider Notes (Signed)
Called to room 4 patient falling out of bed. It appeared that he was dreaming and became startled and jumped/fell out of bed and struck struck his head on the wall. He has tenderness over his frontal head without any appreciable swelling. He appears to be a little groggy and is not sure what events occurred. He is moving all extremities. He has no cervical, thoracic, lumbar tenderness. Patient transferred to bed plan to CT head.  Tilden Fossa, MD 09/10/15 0010

## 2015-09-09 NOTE — BH Assessment (Addendum)
[facility] Assessment Progress Note  Per Carolanne Grumbling, MD, this pt requires psychiatric hospitalization at this time.  He presents under IVC initiated by [facility] ACT Team and upheld by Dr Ladona Ridgel.  The following facilities have been contacted to seek placement for this pt, with results as noted:  Beds available, information sent, decision pending:  [facility] [place] [facility] [place]   At capacity:  [facility]   Doylene Canning, [place] Triage Specialist [phone]
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
## 2015-09-09 NOTE — ED Notes (Signed)
Pt had to be redirected to room several times. Pt then hear groaning very loudly in room, Abbie RN to room and pt noted to be masturbating with door open. Pt ask not to do this in the open.

## 2015-09-09 NOTE — Discharge Instructions (Signed)
For your ongoing behavioral health needs, you are advised to follow up with the [facility], your current outpatient provider:       [facility]      [address]      [place], [place] [address]      [phone]

## 2015-09-09 NOTE — ED Notes (Signed)
Pt picked up walker and dropped it to opposite side of bed.  Pt was again advised to call for assistance.  Pt verbalized understanding.

## 2015-09-09 NOTE — BH Assessment (Addendum)
[facility] Assessment Progress Note  Per Carolanne Grumbling, MD, this pt does not require psychiatric hospitalization at this time.  He presents under IVC initiated by his ACT Team clinician, Everardo All. Manson Passey.  Pt is to be released from IVC and discharged from [facility] with recommendation to follow up with [facility], his ACT Team provider.  This recommendation has been included in pt's discharge instructions.  At 11:17 I spoke to [place] at [facility].  She agrees to have an ACT Team clinician call me back regarding pt's transportation needs.  As of this writing return call is pending.  Doylene Canning, MA Triage Specialist [phone]    Addendum:  This writer placed a follow up call to [place] at 14:30.  She reports that she is still uncertain whether ACT Team will be able to provide transportation for the pt.  She agrees to ask team to call me.  Doylene Canning, MA Triage Specialist [phone]

## 2015-09-09 NOTE — Progress Notes (Addendum)
Pt wit 32 ED visits in last 6 months  Pt is BH & homeless and was seen by [facility] ED CM for homeless resources therefore pt aware of available resources  Pt confirms with [facility] ED CM pcp is at [facility] updated  Entered in d/c instructions medicaid patient [place]: [phone] [address] [place], [place] [address] [email or web address] Use this website to assist with understanding your coverage & to renew application As a Medicaid client you MUST contact DSS/SSI each time you change address, move to another  county or another state to keep your address updated  [place] Medicaid Transportation to Dr appts if you are have full Medicaid: [phone], [phone]  Jackie Plum Schedule an appointment as soon as possible for a visit As needed [address] [place] [address] [phone]

## 2015-09-10 DIAGNOSIS — F319 Bipolar disorder, unspecified: Secondary | ICD-10-CM

## 2015-09-10 MED ORDER — LORAZEPAM 1 MG PO TABS
2.0000 mg | ORAL_TABLET | Freq: Once | ORAL | Status: AC
  Administered 2015-09-10: 2 mg via ORAL
  Filled 2015-09-10: qty 2
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
## 2015-09-10 NOTE — ED Notes (Addendum)
Pt is in the bed with a sitter. He appears depressed this am but is cooperative. All siderails up. Pt has no complaints but stated he has been having bad dreams. Pt voided 400cc via urinal. (9am )Pt ate 100% of his breakfast. He took a shower with minimal assistance. (10am)Pt "by accident " spills his water and food appears on the floor after the pts room has been cleaned. Pt has knocked over his urine and water on several occassions today. He just knocked over a whole urinal full of urine. (6:20pm )Report to [facility] (7;20pm )

## 2015-09-10 NOTE — Progress Notes (Signed)
Per Sheron at [facility], patient was declined by [place] due to acuity.  Melbourne Abts, LCSWA Disposition staff 09/10/2015 7:59 PM

## 2015-09-10 NOTE — ED Notes (Signed)
Pt changed into new paper scrubs & linens changed.

## 2015-09-10 NOTE — Consult Note (Signed)
[facility] Psych ED Progress Note  09/10/2015 12:38 PM Charles Leon  MRN:  [id number] Subjective:  Depressed he says but not suicidal Principal Problem: Bipolar 1 disorder, depressed ([place]) Diagnosis:   Patient Active Problem List   Diagnosis Date Noted  . Mania ([place]) [F30.9]   . Bipolar 1 disorder, depressed ([place]) [F31.9] 06/06/2014   Total Time spent with patient: 20 minutes  Past Psychiatric History: several hospitalizations with a diagnosis of bipolar 1  Past Medical History:  Past Medical History  Diagnosis Date  . Lumbar spinal cord injury ([place]) 2012  . Bipolar disorder ([place])   . Depression   . Hypertension   . Diabetes mellitus without complication [facility])     Past Surgical History  Procedure Laterality Date  . Back surgery    . Eye surgery     Family History: No family history on file. Family Psychiatric  History: none known Social History:  History  Alcohol Use  . 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 Marital Status: Single    Spouse Name: N/A  . Number of Children: N/A  . Years of Education: N/A   Social History Main Topics  . Smoking status: Current Every Day Smoker    Types: Cigarettes  . Smokeless tobacco: None  . Alcohol Use: Yes     Comment: "Occasional"   . Drug Use: No     Comment: Hx of crack/cocaine, THC denies   . Sexual Activity: Not Asked   Other Topics Concern  . None   Social History Narrative    Sleep: Fair  Appetite:  Good  Current Medications: Current Facility-Administered Medications  Medication Dose Route Frequency Provider Last Rate Last Dose  . ibuprofen (ADVIL,MOTRIN) tablet 400 mg  400 mg Oral Q6H PRN Leo Grosser, MD      . lisinopril (PRINIVIL,ZESTRIL) tablet 20 mg  20 mg Oral Daily Leo Grosser, MD   20 mg at 09/10/15 0916  . meloxicam (MOBIC) tablet 15 mg  15 mg Oral Daily Leo Grosser, MD   15 mg at 09/10/15 0916  .  metFORMIN (GLUCOPHAGE) tablet 500 mg  500 mg Oral BID WC Leo Grosser, MD   500 mg at 09/10/15 1660  . risperiDONE (RISPERDAL M-TABS) disintegrating tablet 1 mg  1 mg Oral BID Leo Grosser, MD   1 mg at 09/10/15 6301  . tamsulosin (FLOMAX) capsule 0.4 mg  0.4 mg Oral Daily Leo Grosser, MD   0.4 mg at 09/10/15 1027  . traZODone (DESYREL) tablet 50 mg  50 mg Oral QHS Leo Grosser, MD   50 mg at 09/09/15 2312  . white petrolatum (VASELINE) gel   Topical PRN Ozella Almond Ward, PA-C       Current Outpatient Prescriptions  Medication Sig Dispense Refill  . ibuprofen (ADVIL,MOTRIN) 800 MG tablet Take 1 tablet (800 mg total) by mouth 3 (three) times daily. (Patient taking differently: Take 800 mg by mouth every 6 (six) hours as needed for moderate pain. ) 42 tablet 0  . lisinopril (PRINIVIL,ZESTRIL) 20 MG tablet Take 1 tablet (20 mg total) by mouth daily. 30 tablet 1  . meloxicam (MOBIC) 7.5 MG tablet Take 2 tablets (15 mg total) by mouth daily. 30 tablet 0  . metFORMIN (GLUCOPHAGE) 500 MG tablet Take 1 tablet (500 mg total) by mouth 2 (two) times daily  with a meal. 60 tablet 1  . risperiDONE (RISPERIDONE M-TAB) 1 MG disintegrating tablet Take 1 tablet (1 mg total) by mouth 2 (two) times daily. 60 tablet 1  . silver sulfADIAZINE (SILVADENE) 1 % cream Apply 1 application topically daily. 50 g 0  . tamsulosin (FLOMAX) 0.4 MG CAPS capsule Take 1 capsule (0.4 mg total) by mouth daily. 30 capsule 1  . traZODone (DESYREL) 50 MG tablet Take 1 tablet (50 mg total) by mouth at bedtime. 30 tablet 0  . cyclobenzaprine (FLEXERIL) 10 MG tablet Take 1 tablet (10 mg total) by mouth 2 (two) times daily as needed for muscle spasms. 20 tablet 0    Lab Results:  Results for orders placed or performed during the hospital encounter of 09/08/15 (from the past 48 hour(s))  Ethanol     Status: None   Collection Time: 09/08/15  7:32 PM  Result Value Ref Range   Alcohol, Ethyl (B) <5 <5 mg/dL    Comment:        LOWEST  DETECTABLE LIMIT FOR SERUM ALCOHOL IS 5 mg/dL FOR MEDICAL PURPOSES ONLY   Comprehensive metabolic panel     Status: Abnormal   Collection Time: 09/08/15  7:32 PM  Result Value Ref Range   Sodium 141 135 - 145 mmol/L   Potassium 4.1 3.5 - 5.1 mmol/L   Chloride 110 101 - 111 mmol/L   CO2 22 22 - 32 mmol/L   Glucose, Bld 142 (H) 65 - 99 mg/dL   BUN 20 6 - 20 mg/dL   Creatinine, Ser 1.06 0.61 - 1.24 mg/dL   Calcium 9.1 8.9 - 10.3 mg/dL   Total Protein 7.1 6.5 - 8.1 g/dL   Albumin 3.7 3.5 - 5.0 g/dL   AST 44 (H) 15 - 41 U/L   ALT 34 17 - 63 U/L   Alkaline Phosphatase 82 38 - 126 U/L   Total Bilirubin 0.7 0.3 - 1.2 mg/dL   GFR calc non Af Amer >60 >60 mL/min   GFR calc Af Amer >60 >60 mL/min    Comment: (NOTE) The eGFR has been calculated using the CKD EPI equation. This calculation has not been validated in all clinical situations. eGFR's persistently <60 mL/min signify possible Chronic Kidney Disease.    Anion gap 9 5 - 15  CBC with Differential     Status: Abnormal   Collection Time: 09/08/15  7:32 PM  Result Value Ref Range   WBC 9.0 4.0 - 10.5 K/uL   RBC 4.72 4.22 - 5.81 MIL/uL   Hemoglobin 12.8 (L) 13.0 - 17.0 g/dL   HCT 40.5 39.0 - 52.0 %   MCV 85.8 78.0 - 100.0 fL   MCH 27.1 26.0 - 34.0 pg   MCHC 31.6 30.0 - 36.0 g/dL   RDW 14.6 11.5 - 15.5 %   Platelets 326 150 - 400 K/uL   Neutrophils Relative % 57 %   Neutro Abs 5.2 1.7 - 7.7 K/uL   Lymphocytes Relative 32 %   Lymphs Abs 2.9 0.7 - 4.0 K/uL   Monocytes Relative 8 %   Monocytes Absolute 0.7 0.1 - 1.0 K/uL   Eosinophils Relative 3 %   Eosinophils Absolute 0.3 0.0 - 0.7 K/uL   Basophils Relative 0 %   Basophils Absolute 0.0 0.0 - 0.1 K/uL  Urine rapid drug screen (hosp performed)     Status: None   Collection Time: 09/08/15 11:32 PM  Result Value Ref Range   Opiates NONE DETECTED NONE DETECTED  Cocaine NONE DETECTED NONE DETECTED   Benzodiazepines NONE DETECTED NONE DETECTED   Amphetamines NONE DETECTED  NONE DETECTED   Tetrahydrocannabinol NONE DETECTED NONE DETECTED   Barbiturates NONE DETECTED NONE DETECTED    Comment:        DRUG SCREEN FOR MEDICAL PURPOSES ONLY.  IF CONFIRMATION IS NEEDED FOR ANY PURPOSE, NOTIFY LAB WITHIN 5 DAYS.        LOWEST DETECTABLE LIMITS FOR URINE DRUG SCREEN Drug Class       Cutoff (ng/mL) Amphetamine      1000 Barbiturate      200 Benzodiazepine   366 Tricyclics       294 Opiates          300 Cocaine          300 THC              50   CBG monitoring, ED     Status: Abnormal   Collection Time: 09/09/15  5:20 PM  Result Value Ref Range   Glucose-Capillary 114 (H) 65 - 99 mg/dL    Blood Alcohol level:  Lab Results  Component Value Date   ETH <5 09/08/2015   ETH <5 09/07/2015    Physical Findings: AIMS:  , ,  ,  ,    CIWA:    COWS:     Musculoskeletal: Strength & Muscle Tone: within normal limits Gait & Station: walks with a cane Patient leans: N/A  Psychiatric Specialty Exam: Review of Systems  Constitutional: Negative.   HENT: Negative.   Eyes: Negative.   Respiratory: Negative.   Cardiovascular: Negative.   Gastrointestinal: Negative.   Genitourinary: Negative.   Musculoskeletal: Negative.   Skin: Negative.   Neurological: Negative.   Endo/Heme/Allergies: Negative.   Psychiatric/Behavioral: Positive for depression.    Blood pressure 123/83, pulse 74, temperature 98 F (36.7 C), temperature source Oral, resp. rate 20, SpO2 97 %.There is no weight on file to calculate BMI.  General Appearance: Well Groomed  Engineer, water::  Good  Speech:  Clear and Coherent  Volume:  Normal  Mood:  Depressed  Affect:  Flat  Thought Process:  Coherent  Orientation:  Full (Time, Place, and Person)  Thought Content:  Negative  Suicidal Thoughts:  No  Homicidal Thoughts:  No  Memory:  Immediate;   Good Recent;   Good Remote;   Good  Judgement:  Poor  Insight:  Lacking  Psychomotor Activity:  Normal  Concentration:  Good  Recall:   Good  Fund of Knowledge:Good  Language: Good  Akathisia:  Negative  Handed:  Right  AIMS (if indicated):     Assets:  Social Support  ADL's:  Intact  Cognition: WNL  Sleep:      Treatment Plan Summary: Daily contact with patient to assess and evaluate symptoms and progress in treatment, Medication management and Plan still awaiting inpatient placement.  Brain scan has been done and is normal.  He is restless, making messes in his room, not processing information well, no recall of lying in the road he says.  Sleep is disturbed as well, restless and even falling out of his bed. I talked to his ACTteam representative and he reported that this has not been his usual behavior until November after a visit to [place].  The patient cannot recall drinking anything or being given anything while there. Will continue looking for a bed  Donnelly Angelica, MD 09/10/2015, 12:38 PM

## 2015-09-11 DIAGNOSIS — F319 Bipolar disorder, unspecified: Secondary | ICD-10-CM | POA: Diagnosis not present

## 2015-09-11 NOTE — Progress Notes (Addendum)
CSW spoke with Al[facility] [phone] for authorization of referral for [facility]. CSW then spoke with Algernon Huxley who provided the authorization number: [id number]. He reports the number is good for seven days, beginning today Feb. 24th through Mar. 2nd.   Charles Leon [phone] ED CSW 09/11/2015 12:36 PM

## 2015-09-11 NOTE — BH Assessment (Signed)
[facility] Assessment Progress Note  At 13:20 this writer called [facility] and spoke to [facility] about referring this pt.  She took demographic information.  Clinical information with then faxed.  At 13:41 Junious Dresser calls from [facility] to report that pt is now on their wait list.  Doylene Canning, MA Triage Specialist [phone]

## 2015-09-11 NOTE — Consult Note (Signed)
[facility] Psych ED Progress Note  09/11/2015 12:25 PM Charles Leon  MRN:  [id number] Subjective:  Depressed he says but not suicidal Principal Problem: Bipolar 1 disorder, depressed (H[facility]) Diagnosis:   Patient Active Problem List   Diagnosis Date Noted  . Bipolar 1 disorder, depressed (H[facility]) [F31.9] 06/06/2014    Priority: High   Total Time spent with patient: 30 minutes  Past Psychiatric History: several hospitalizations with a diagnosis of bipolar 1  HPI: Today: Client currently endorses depression and presents with a flat affect on assessment. The patient continues to have behavioral and processing issues, with nursing staff reporting that after witnessing another patient fall out of their bed, the patient fell out of bed as well. Patient continues to report insomnia and states his sleep is "too restless." Patient given education about [facility] referral and patient had issues understanding this process.   Past Medical History:  Past Medical History  Diagnosis Date  . Lumbar spinal cord injury (HCC) 2012  . Bipolar disorder (H[facility])   . Depression   . Hypertension   . Diabetes mellitus without complication [facility])     Past Surgical History  Procedure Laterality Date  . Back surgery    . Eye surgery     Family History: No family history on file. Family Psychiatric  History: none known Social History:  History  Alcohol Use  . 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 Marital Status: Single    Spouse Name: N/A  . Number of Children: N/A  . Years of Education: N/A   Social History Main Topics  . Smoking status: Current Every Day Smoker    Types: Cigarettes  . Smokeless tobacco: None  . Alcohol Use: Yes     Comment: "Occasional"   . Drug Use: No     Comment: Hx of crack/cocaine, THC denies   . Sexual Activity: Not Asked   Other Topics Concern  . None   Social History Narrative     Sleep: Fair  Appetite:  Good  Current Medications: Current Facility-Administered Medications  Medication Dose Route Frequency Provider Last Rate Last Dose  . ibuprofen (ADVIL,MOTRIN) tablet 400 mg  400 mg Oral Q6H PRN Lyndal Pulley, MD      . lisinopril (PRINIVIL,ZESTRIL) tablet 20 mg  20 mg Oral Daily Lyndal Pulley, MD   20 mg at 09/11/15 4098  . meloxicam (MOBIC) tablet 15 mg  15 mg Oral Daily Lyndal Pulley, MD   15 mg at 09/11/15 1191  . metFORMIN (GLUCOPHAGE) tablet 500 mg  500 mg Oral BID WC Lyndal Pulley, MD   500 mg at 09/11/15 4782  . risperiDONE (RISPERDAL M-TABS) disintegrating tablet 1 mg  1 mg Oral BID Lyndal Pulley, MD   1 mg at 09/11/15 9562  . tamsulosin (FLOMAX) capsule 0.4 mg  0.4 mg Oral Daily Lyndal Pulley, MD   0.4 mg at 09/11/15 1308  . traZODone (DESYREL) tablet 50 mg  50 mg Oral QHS Lyndal Pulley, MD   50 mg at 09/10/15 2110  . white petrolatum (VASELINE) gel   Topical PRN Chase Picket Ward, PA-C       Current Outpatient Prescriptions  Medication Sig Dispense Refill  . ibuprofen (ADVIL,MOTRIN) 800 MG tablet Take 1 tablet (800 mg total) by mouth 3 (three) times daily. (Patient taking differently: Take 800 mg by  mouth every 6 (six) hours as needed for moderate pain. ) 42 tablet 0  . lisinopril (PRINIVIL,ZESTRIL) 20 MG tablet Take 1 tablet (20 mg total) by mouth daily. 30 tablet 1  . meloxicam (MOBIC) 7.5 MG tablet Take 2 tablets (15 mg total) by mouth daily. 30 tablet 0  . metFORMIN (GLUCOPHAGE) 500 MG tablet Take 1 tablet (500 mg total) by mouth 2 (two) times daily with a meal. 60 tablet 1  . risperiDONE (RISPERIDONE M-TAB) 1 MG disintegrating tablet Take 1 tablet (1 mg total) by mouth 2 (two) times daily. 60 tablet 1  . silver sulfADIAZINE (SILVADENE) 1 % cream Apply 1 application topically daily. 50 g 0  . tamsulosin (FLOMAX) 0.4 MG CAPS capsule Take 1 capsule (0.4 mg total) by mouth daily. 30 capsule 1  . traZODone (DESYREL) 50 MG tablet Take 1 tablet (50 mg total)  by mouth at bedtime. 30 tablet 0  . cyclobenzaprine (FLEXERIL) 10 MG tablet Take 1 tablet (10 mg total) by mouth 2 (two) times daily as needed for muscle spasms. 20 tablet 0    Lab Results:  Results for orders placed or performed during the hospital encounter of 09/08/15 (from the past 48 hour(s))  CBG monitoring, ED     Status: Abnormal   Collection Time: 09/09/15  5:20 PM  Result Value Ref Range   Glucose-Capillary 114 (H) 65 - 99 mg/dL    Blood Alcohol level:  Lab Results  Component Value Date   ETH <5 09/08/2015   ETH <5 09/07/2015    Musculoskeletal: Strength & Muscle Tone: within normal limits Gait & Station: walks with a cane Patient leans: N/A  Psychiatric Specialty Exam: Review of Systems  Constitutional: Negative.   HENT: Negative.   Eyes: Negative.   Respiratory: Negative.   Cardiovascular: Negative.   Gastrointestinal: Negative.   Genitourinary: Negative.   Musculoskeletal: Negative.   Skin: Negative.   Neurological: Negative.   Endo/Heme/Allergies: Negative.   Psychiatric/Behavioral: Positive for depression.    Blood pressure 116/66, pulse 95, temperature 98.2 F (36.8 C), temperature source Oral, resp. rate 18, SpO2 97 %.There is no weight on file to calculate BMI.  General Appearance: Well Groomed  Patent attorney::  Good  Speech:  Clear and Coherent  Volume:  Normal  Mood:  Depressed  Affect:  Flat  Thought Process:  Coherent  Orientation:  Full (Time, Place, and Person)  Thought Content:  Negative  Suicidal Thoughts:  No  Homicidal Thoughts:  No  Memory:  Immediate;   Good Recent;   Good Remote;   Good  Judgement:  Poor  Insight:  Lacking  Psychomotor Activity:  Normal  Concentration:  Good  Recall:  Good  Fund of Knowledge:Good  Language: Good  Akathisia:  Negative  Handed:  Right  AIMS (if indicated):     Assets:  Social Support  ADL's:  Intact  Cognition: WNL  Sleep:      Treatment Plan Summary: Daily contact with patient to  assess and evaluate symptoms and progress in treatment, Medication management and Plan still awaiting inpatient placement. Diagnosis: Bipolar I Disorder, currently depressed -Crisis Stabilization -Individual Counseling -Medication Management:  Continue:  Trazodone  QHS for insomnia  Risperdal  BID for mood stabilization and psychosis  Disposition: - Patient meets criteria for inpatient admission - Continue to search for placement at [facility]   [name], NP 09/11/2015, 12:25 PM

## 2015-09-11 NOTE — ED Notes (Signed)
Assisted patient with urinal and assisted back to bed.

## 2015-09-12 LAB — CBG MONITORING, ED: GLUCOSE-CAPILLARY: 113 mg/dL — AB (ref 65–99)

## 2015-09-12 NOTE — Progress Notes (Signed)
Re-assessment  Pt reports he is feeling better today.  Patient reports sleeping good, eating good, and being compliant with medications.  Patient reports the medications has decreased some of his symptoms of paranoia and hallucinations.  Patient reports he is still having some paranoia but not to the point he is out of control.  Patient reports a history of non-compliance with medication and having a past conversation with his ACTT about injectable medications.  He is now contemplating injectable medication to assure medication maintenance for stability.  He provided this Clinical research associate with permission to contact his ACTT to inform of the discussion about IM medications.      Maryelizabeth Rowan, MSW, [facility] Triage Specialist [phone]

## 2015-09-12 NOTE — ED Notes (Signed)
Patient woke up used walker with assistance by sitter to get to bathroom, then took a shower with assistance from sitter.  Bed linens changed.  Oral care done.  Patient reports he feels pretty good but is very tired.  Totally cooperative.

## 2015-09-12 NOTE — ED Notes (Addendum)
Pt is lying in the bed watching TV. He presently has a Comptroller . Pt is cooperative but at times does appear depressed. He does contract for safety and denies SI and HI- pts uncle came to visit. (4:30pm)  Pt ate 100% of his dinner. Presently he is lying in the bed. Pt remains cooperative and is in the bed. 6:50pm   Report to oncoming shift. (7pm )

## 2015-09-12 NOTE — ED Notes (Signed)
Pt is lying in bed watching TV,  He has a sitter at bedside,  Pt is cooperative presently

## 2015-09-13 NOTE — BH Assessment (Signed)
Patient was reassessed by TTS.   Patient states that he came to the ED because he was unable to walk. Patient states that his ACT Team with [facility] [employer] "got a petition on me" but states that he is not sure why. Patient states that he was hearing voices while he was in his apartment and he "messed it up real bad." Patient states that he stopped hearing voices and tried to clean it up but was distracted. Patient denies AVH at this time. Patient denies SI and HI. Patient states that he thinks that he needs to have a live-in CNA to help meet his daily needs like caring for himself and distributing his medications as scheduled. Patient states that he has taken his medications since being in the ED and he feels fine. Patient states that he has been eating and sleeping well.    Patient states that he was informed Friday that he would go to a hospital in [place] and asked what would happen to his apartment if he went to a hospital out of town. Patient was encouraged to contact his ACT Team and this Clinical research associate provided patient with the telephone number to the ACT Team [facility] to discuss this matter with them directly.   [name], DNP and Dr. Elsie Saas  Continue to recommend treatment at [facility].  Davina Poke, LCSW Therapeutic Triage Specialist [facility] 09/13/2015 9:43 AM
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
## 2015-09-14 LAB — CBG MONITORING, ED: GLUCOSE-CAPILLARY: 120 mg/dL — AB (ref 65–99)

## 2015-09-14 NOTE — Consult Note (Signed)
[facility] Face-to-Face Psychiatry Consult   Reason for Consult:  Psychosis Referring Physician:  EDP Patient Identification: Charles Leon MRN:  [id number] Principal Diagnosis: Bipolar 1 disorder, depressed (HCC) Diagnosis:   Patient Active Problem List   Diagnosis Date Noted  . Bipolar 1 disorder, depressed (HCC) [F31.9] 06/06/2014    Priority: High    Total Time spent with patient: 30 minutes  Subjective:   Charles Leon is a 55 y.o. male patient does not warrant admission.  HPI:  54 yo male who presented to the ED initially with psychosis, history of bipolar disorder.  Patient has stabilized on medications and is stable, clear/coherent/logical.  Denies suicidal/homicidal ideations, hallucinations, and alcohol/drug abuse.  His ACT team has been notified and they have a place for him to live, awaiting a supervisor from the ACT team to pick the patient up from the ED.  Past Psychiatric History: bipolar disorder  Risk to Self: Suicidal Ideation: No Suicidal Intent: No Is patient at risk for suicide?: No Suicidal Plan?: No Access to Means: No What has been your use of drugs/alcohol within the last 12 months?: Pt denies How many times?: 1 (In 1986) Other Self Harm Risks: Hx of SA, Homeless Triggers for Past Attempts: Unknown Intentional Self Injurious Behavior: None Risk to Others: Homicidal Ideation: No Thoughts of Harm to Others: Yes-Currently Present Comment - Thoughts of Harm to Others: Pt made threats to harm others tonight. Current Homicidal Intent: No Current Homicidal Plan: No Access to Homicidal Means: No Identified Victim: No one in particular, per pt History of harm to others?: No Assessment of Violence: On admission Violent Behavior Description: Pt combative upon arrival to ED, destroying property in ED and had to be handcuffed to bed at one point. Does patient have access to weapons?: No Criminal Charges Pending?: Yes Describe Pending Criminal Charges: Stolen  vehicles Does patient have a court date: Yes Court Date: 09/14/15 (or in March, Pt cannot recall the date) Prior Inpatient Therapy: Prior Inpatient Therapy: Yes Prior Therapy Dates: Multiple Prior Therapy Facilty/Provider(s): [facility] (2015), [place] (in [place]), etc Reason for Treatment: SI, Depression Prior Outpatient Therapy: Prior Outpatient Therapy: Yes Prior Therapy Dates: Current Prior Therapy Facilty/Provider(s): [facility] Reason for Treatment: ACTT (Unsure if still active; Pt is a poor historian) Does patient have an ACCT team?: Yes Does patient have Intensive In-House Services?  : No Does patient have [facility] services? : No Does patient have P4CC services?: No  Past Medical History:  Past Medical History  Diagnosis Date  . Lumbar spinal cord injury (HCC) 2012  . Bipolar disorder (HCC)   . Depression   . Hypertension   . Diabetes mellitus without complication [facility])     Past Surgical History  Procedure Laterality Date  . Back surgery    . Eye surgery     Family History: No family history on file. Family Psychiatric  History: NOne Social History:  History  Alcohol Use  . 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 Marital Status: Single    Spouse Name: N/A  . Number of Children: N/A  . Years of Education: N/A   Social History Main Topics  . Smoking status: Current Every Day Smoker    Types: Cigarettes  . Smokeless tobacco: None  . Alcohol Use: Yes     Comment: "Occasional"   .  Drug Use: No     Comment: Hx of crack/cocaine, THC denies   . Sexual Activity: Not Asked   Other Topics Concern  . None   Social History Narrative   Additional Social History:    Allergies:   Allergies  Allergen Reactions  . Haldol [Haloperidol] Other (See Comments)    Stiff neck    Labs:  Results for orders placed or performed during the hospital encounter of  09/08/15 (from the past 48 hour(s))  CBG monitoring, ED     Status: Abnormal   Collection Time: 09/14/15 10:54 AM  Result Value Ref Range   Glucose-Capillary 120 (H) 65 - 99 mg/dL    Current Facility-Administered Medications  Medication Dose Route Frequency Provider Last Rate Last Dose  . ibuprofen (ADVIL,MOTRIN) tablet 400 mg  400 mg Oral Q6H PRN Lyndal Pulley, MD   400 mg at 09/14/15 1042  . lisinopril (PRINIVIL,ZESTRIL) tablet 20 mg  20 mg Oral Daily Lyndal Pulley, MD   20 mg at 09/14/15 1042  . meloxicam (MOBIC) tablet 15 mg  15 mg Oral Daily Lyndal Pulley, MD   15 mg at 09/14/15 1041  . metFORMIN (GLUCOPHAGE) tablet 500 mg  500 mg Oral BID WC Lyndal Pulley, MD   500 mg at 09/14/15 1610  . risperiDONE (RISPERDAL M-TABS) disintegrating tablet 1 mg  1 mg Oral BID Lyndal Pulley, MD   1 mg at 09/14/15 1041  . tamsulosin (FLOMAX) capsule 0.4 mg  0.4 mg Oral Daily Lyndal Pulley, MD   0.4 mg at 09/14/15 1041  . traZODone (DESYREL) tablet 50 mg  50 mg Oral QHS Lyndal Pulley, MD   50 mg at 09/13/15 2219  . white petrolatum (VASELINE) gel   Topical PRN Chase Picket Ward, PA-C       Current Outpatient Prescriptions  Medication Sig Dispense Refill  . ibuprofen (ADVIL,MOTRIN) 800 MG tablet Take 1 tablet (800 mg total) by mouth 3 (three) times daily. (Patient taking differently: Take 800 mg by mouth every 6 (six) hours as needed for moderate pain. ) 42 tablet 0  . lisinopril (PRINIVIL,ZESTRIL) 20 MG tablet Take 1 tablet (20 mg total) by mouth daily. 30 tablet 1  . meloxicam (MOBIC) 7.5 MG tablet Take 2 tablets (15 mg total) by mouth daily. 30 tablet 0  . metFORMIN (GLUCOPHAGE) 500 MG tablet Take 1 tablet (500 mg total) by mouth 2 (two) times daily with a meal. 60 tablet 1  . risperiDONE (RISPERIDONE M-TAB) 1 MG disintegrating tablet Take 1 tablet (1 mg total) by mouth 2 (two) times daily. 60 tablet 1  . silver sulfADIAZINE (SILVADENE) 1 % cream Apply 1 application topically daily. 50 g 0  . tamsulosin  (FLOMAX) 0.4 MG CAPS capsule Take 1 capsule (0.4 mg total) by mouth daily. 30 capsule 1  . traZODone (DESYREL) 50 MG tablet Take 1 tablet (50 mg total) by mouth at bedtime. 30 tablet 0  . cyclobenzaprine (FLEXERIL) 10 MG tablet Take 1 tablet (10 mg total) by mouth 2 (two) times daily as needed for muscle spasms. 20 tablet 0    Musculoskeletal: Strength & Muscle Tone: within normal limits Gait & Station: normal Patient leans: N/A  Psychiatric Specialty Exam: Review of Systems  Constitutional: Negative.   HENT: Negative.   Eyes: Negative.   Respiratory: Negative.   Cardiovascular: Negative.   Gastrointestinal: Negative.   Genitourinary: Negative.   Musculoskeletal: Negative.   Skin: Negative.   Neurological: Negative.   Endo/Heme/Allergies: Negative.   Psychiatric/Behavioral: Negative.  Blood pressure 115/78, pulse 73, temperature 97.4 F (36.3 C), temperature source Oral, resp. rate 18, SpO2 96 %.There is no weight on file to calculate BMI.  General Appearance: Casual  Eye Contact::  Good  Speech:  Normal Rate  Volume:  Normal  Mood:  Euthymic  Affect:  Congruent  Thought Process:  Coherent  Orientation:  Full (Time, Place, and Person)  Thought Content:  WDL  Suicidal Thoughts:  No  Homicidal Thoughts:  No  Memory:  Immediate;   Good Recent;   Good Remote;   Good  Judgement:  Fair  Insight:  Good  Psychomotor Activity:  Normal  Concentration:  Good  Recall:  Good  Fund of Knowledge:Good  Language: Good  Akathisia:  No  Handed:  Right  AIMS (if indicated):     Assets:  Housing Leisure Time Resilience Social Support  ADL's:  Intact  Cognition: WNL  Sleep:      Treatment Plan Summary: Daily contact with patient to assess and evaluate symptoms and progress in treatment, Medication management and Plan bipolar affective disorder, chronic, stable: -Crisis stabilization -Medication management:  Continue Risperdal 1 mg BID for mood stabilization and Trazodone 50  mg at bedtime for sleep issues. -Individual counseling -Coordination of care to transition from hospital to home  Disposition: No evidence of imminent risk to self or others at present.    [name], NP 09/14/2015 2:01 PM

## 2015-09-14 NOTE — [facility] Suicide Risk Assessment (Signed)
Suicide Risk Assessment  Discharge Assessment   [facility] Discharge Suicide Risk Assessment   Principal Problem: Bipolar 1 disorder, depressed [facility]) Discharge Diagnoses:  Patient Active Problem List   Diagnosis Date Noted  . Bipolar 1 disorder, depressed (HCC) [F31.9] 06/06/2014    Priority: High    Total Time spent with patient: 45 minutes  Musculoskeletal: Strength & Muscle Tone: within normal limits Gait & Station: normal Patient leans: N/A  Psychiatric Specialty Exam: Review of Systems  Constitutional: Negative.   HENT: Negative.   Eyes: Negative.   Respiratory: Negative.   Cardiovascular: Negative.   Gastrointestinal: Negative.   Genitourinary: Negative.   Musculoskeletal: Negative.   Skin: Negative.   Neurological: Negative.   Endo/Heme/Allergies: Negative.   Psychiatric/Behavioral: Negative.     Blood pressure 115/78, pulse 73, temperature 97.4 F (36.3 C), temperature source Oral, resp. rate 18, SpO2 96 %.There is no weight on file to calculate BMI.  General Appearance: Casual  Eye Contact::  Good  Speech:  Normal Rate  Volume:  Normal  Mood:  Euthymic  Affect:  Congruent  Thought Process:  Coherent  Orientation:  Full (Time, Place, and Person)  Thought Content:  WDL  Suicidal Thoughts:  No  Homicidal Thoughts:  No  Memory:  Immediate;   Good Recent;   Good Remote;   Good  Judgement:  Fair  Insight:  Good  Psychomotor Activity:  Normal  Concentration:  Good  Recall:  Good  Fund of Knowledge:Good  Language: Good  Akathisia:  No  Handed:  Right  AIMS (if indicated):     Assets:  Housing Leisure Time Resilience Social Support  ADL's:  Intact  Cognition: WNL  Sleep:      Mental Status Per Nursing Assessment::   On Admission:   psychosis  Demographic Factors:  Male and Living alone  Loss Factors: NA  Historical Factors: NA  Risk Reduction Factors:   Sense of responsibility to family, Positive social support and Positive therapeutic  relationship  Continued Clinical Symptoms:  None   Cognitive Features That Contribute To Risk:  None    Suicide Risk:  Minimal: No identifiable suicidal ideation.  Patients presenting with no risk factors but with morbid ruminations; may be classified as minimal risk based on the severity of the depressive symptoms  Follow-up Information    Follow up with medicaid patient .   [phone] [address] [place], [place] [address] [email or web address] Use this website to assist with understanding your coverage & to renew application   Contact information:   As a Medicaid client you MUST contact DSS/SSI each time you change address, move to another  county or another state to keep your address updated  [place] Medicaid Transportation to Dr appts if you are have full Medicaid: [phone], [phone]      Schedule an appointment as soon as possible for a visit with Jackie Plum, MD.   Specialty:  Internal Medicine   Why:  As needed   Contact information:   [address] [facility] [address] [phone]       Plan Of Care/Follow-up recommendations:  Activity:  as tolerated Diet:  heart healthy diet  LORD, JAMISON, NP 09/14/2015, 2:28 PM

## 2015-09-14 NOTE — Progress Notes (Signed)
CSW staffed with Psychiatrist and he reports patient has been cleared for discharge and for CSW to call [facility] ACT Team to see if someone would be able to pick him up today. CSW spoke with Erich Montane, ACT Team Assistant, who stated neither one of the supervisors were available to speak at the time regarding patient being picked up. She stated patient is on one of the ACT Team schedules. She stated she would contact that individual to contact CSW. CSW provided name and contact number for the return call.   [name] [phone] ED CSW 09/14/2015 2:16 PM

## 2015-09-14 NOTE — ED Notes (Signed)
Pt requested to speak with Child psychotherapist for update.  RN contacted SW by phone and updated Pt and answered his questions.

## 2015-09-14 NOTE — ED Notes (Addendum)
Pt has been cleared by Psych (Dr. Maisie Fus).  IVC has been DC'd, Suicide Precautions have been DC'd by Dr. Maisie Fus.  RN received V.O.  Staffing notified.

## 2015-09-14 NOTE — Progress Notes (Addendum)
Call was received from [facility] at 8:36am stating patient remains on the [facility] wait list.   CSW was informed by Psychiatry Team to contact the [facility] Act Team to see if patient has a place to stay, if they have a pill box system, and if someone would be able to go out to see patient at home. CSW spoke with [place], ACT Team [phone]. She reports patient has a Engineer, maintenance, Jens Som [phone] who is working on a place for patient to stay. She also reports they have a pill box system in place for patient and that he is seen three times weekly. She reports patient would be able to be seen more than three times weekly to check on his pill box system.   CSW left message for Jens Som, Housing Specialist for return call with name and contact number. CSW received a call back from Jens Som. He reports patient's realtor company is allowing patient to stay until March 31st. He reports he is still looking for patient a place past the 31st. CSW inquired who would be able to pick up patient once he is deemed to be discharged by the Psychiatry Team. He reports CSW would need to speak with the supervisors regarding transportation.   [name] [phone] ED CSW 09/14/2015 9:10 AM

## 2015-09-17 ENCOUNTER — Emergency Department (HOSPITAL_COMMUNITY)
Admission: EM | Admit: 2015-09-17 | Discharge: 2015-09-17 | Disposition: A | Discharge status: Home / Self Care | Payer: Medicaid Other | Attending: Emergency Medicine | Admitting: Emergency Medicine

## 2015-09-17 ENCOUNTER — Encounter (HOSPITAL_COMMUNITY): Payer: Self-pay

## 2015-09-17 DIAGNOSIS — Z792 Long term (current) use of antibiotics: Secondary | ICD-10-CM | POA: Diagnosis not present

## 2015-09-17 DIAGNOSIS — F319 Bipolar disorder, unspecified: Secondary | ICD-10-CM | POA: Diagnosis not present

## 2015-09-17 DIAGNOSIS — I10 Essential (primary) hypertension: Secondary | ICD-10-CM | POA: Insufficient documentation

## 2015-09-17 DIAGNOSIS — Y998 Other external cause status: Secondary | ICD-10-CM | POA: Diagnosis not present

## 2015-09-17 DIAGNOSIS — Z791 Long term (current) use of non-steroidal anti-inflammatories (NSAID): Secondary | ICD-10-CM | POA: Insufficient documentation

## 2015-09-17 DIAGNOSIS — F17210 Nicotine dependence, cigarettes, uncomplicated: Secondary | ICD-10-CM | POA: Diagnosis not present

## 2015-09-17 DIAGNOSIS — S0083XA Contusion of other part of head, initial encounter: Secondary | ICD-10-CM | POA: Insufficient documentation

## 2015-09-17 DIAGNOSIS — Z7984 Long term (current) use of oral hypoglycemic drugs: Secondary | ICD-10-CM | POA: Diagnosis not present

## 2015-09-17 DIAGNOSIS — Y9301 Activity, walking, marching and hiking: Secondary | ICD-10-CM | POA: Insufficient documentation

## 2015-09-17 DIAGNOSIS — Y92512 Supermarket, store or market as the place of occurrence of the external cause: Secondary | ICD-10-CM | POA: Insufficient documentation

## 2015-09-17 DIAGNOSIS — W01198A Fall on same level from slipping, tripping and stumbling with subsequent striking against other object, initial encounter: Secondary | ICD-10-CM | POA: Diagnosis not present

## 2015-09-17 DIAGNOSIS — F10129 Alcohol abuse with intoxication, unspecified: Secondary | ICD-10-CM | POA: Diagnosis present

## 2015-09-17 DIAGNOSIS — F1010 Alcohol abuse, uncomplicated: Secondary | ICD-10-CM | POA: Diagnosis not present

## 2015-09-17 DIAGNOSIS — Z79899 Other long term (current) drug therapy: Secondary | ICD-10-CM | POA: Diagnosis not present

## 2015-09-17 DIAGNOSIS — E119 Type 2 diabetes mellitus without complications: Secondary | ICD-10-CM | POA: Diagnosis not present

## 2015-09-17 NOTE — ED Provider Notes (Signed)
CSN: [id number]     Arrival date & time 09/17/15  0301 History  By signing my name below, I, Budd Palmer, attest that this documentation has been prepared under the direction and in the presence of Eber Hong, MD. Electronically Signed: Budd Palmer, ED Scribe. 09/17/2015. 3:29 AM.      Chief Complaint  Patient presents with  . Alcohol Intoxication   The history is provided by the patient. No language interpreter was used.   HPI Comments: Charles Leon is a 55 y.o. male smoker with a PMHx of lumbar spinal chord injury, bipolar disorder, depression, HTN, and DM brought in by ambulance, who presents to the Emergency Department complaining of alcohol intoxication onset tonight. Per EMS, pt was found intoxicated on the sidewalk after calling them, stating he fell and struck his jaw. Pt states he was walking across the street when he caught his toe on something and fell forward, striking his jaw on the sidewalk. He reports associated facial swelling and jaw pain. He notes he usually used a cane to walk, but was unable to find it tonight. He denies LOC.   Pt has a PMHx of alcohol intoxication and frequent ED visits.   Past Medical History  Diagnosis Date  . Lumbar spinal cord injury (HCC) 2012  . Bipolar disorder (HCC)   . Depression   . Hypertension   . Diabetes mellitus without complication [facility])    Past Surgical History  Procedure Laterality Date  . Back surgery    . Eye surgery     History reviewed. No pertinent family history. Social History  Substance Use Topics  . Smoking status: Current Every Day Smoker    Types: Cigarettes  . Smokeless tobacco: None  . Alcohol Use: Yes     Comment: "Occasional"     Review of Systems  HENT: Positive for facial swelling.   Skin: Negative for wound.  Neurological: Negative for syncope.    Allergies  Haldol  Home Medications   Prior to Admission medications   Medication Sig Start Date End Date Taking? Authorizing Provider   cyclobenzaprine (FLEXERIL) 10 MG tablet Take 1 tablet (10 mg total) by mouth 2 (two) times daily as needed for muscle spasms. 07/29/15   Elpidio Anis, PA-C  ibuprofen (ADVIL,MOTRIN) 800 MG tablet Take 1 tablet (800 mg total) by mouth 3 (three) times daily. Patient taking differently: Take 800 mg by mouth every 6 (six) hours as needed for moderate pain.  08/14/15   Dione Booze, MD  lisinopril (PRINIVIL,ZESTRIL) 20 MG tablet Take 1 tablet (20 mg total) by mouth daily. 09/08/15   Shawn C Joy, PA-C  meloxicam (MOBIC) 7.5 MG tablet Take 2 tablets (15 mg total) by mouth daily. 07/26/15   Antony Madura, PA-C  metFORMIN (GLUCOPHAGE) 500 MG tablet Take 1 tablet (500 mg total) by mouth 2 (two) times daily with a meal. 09/08/15   Shawn C Joy, PA-C  risperiDONE (RISPERIDONE M-TAB) 1 MG disintegrating tablet Take 1 tablet (1 mg total) by mouth 2 (two) times daily. 09/08/15   Shawn C Joy, PA-C  silver sulfADIAZINE (SILVADENE) 1 % cream Apply 1 application topically daily. 08/14/15   Dione Booze, MD  tamsulosin (FLOMAX) 0.4 MG CAPS capsule Take 1 capsule (0.4 mg total) by mouth daily. 09/08/15   Shawn C Joy, PA-C  traZODone (DESYREL) 50 MG tablet Take 1 tablet (50 mg total) by mouth at bedtime. 07/28/15   Earney Navy, NP   BP 94/61 mmHg  Pulse 103  Temp(Src)  98.5 F (36.9 C) (Oral)  Resp 18  SpO2 97% Physical Exam  Constitutional: He is oriented to person, place, and time. He appears well-developed and well-nourished.  HENT:  Head: Normocephalic and atraumatic.  No obvious TTP or deformities to the jaw, no malocclusion, trismus, or torticollis.  Eyes: Conjunctivae are normal. Right eye exhibits no discharge. Left eye exhibits no discharge.  Pulmonary/Chest: Effort normal. No respiratory distress.  Neurological: He is alert and oriented to person, place, and time. Coordination normal.  Speech clear - making jokes - no focal neuro defecits while supine.   Skin: Skin is warm and dry. No rash noted. He is not  diaphoretic. No erythema.  Psychiatric: He has a normal mood and affect.  Nursing note and vitals reviewed.   ED Course  Procedures  DIAGNOSTIC STUDIES: Oxygen Saturation is 97% on RA, adequate by my interpretation.    COORDINATION OF CARE: 3:12 AM - Discussed plans to discharge. Pt advised of plan for treatment and pt agrees.  Labs Review Labs Reviewed - No data to display  Imaging Review No results found. I have personally reviewed and evaluated these images and lab results as part of my medical decision-making.    MDM   Final diagnoses:  Alcohol abuse  Contusion of jaw, initial encounter    33 visits in 6 months - no signs of trauma - pleasant and without complaint - no indication for ER w/u, discharged to lobby.  I personally performed the services described in this documentation, which was scribed in my presence. The recorded information has been reviewed and is accurate.     Eber Hong, MD 09/17/15 [phone]

## 2015-09-17 NOTE — ED Notes (Signed)
According to EMS, pt was found on [employer] intoxicated, instructing EMS to take him to the hospital because he fell and hit his jaw. Pt arrives to [facility] ED Alert, able to stand and pivot from EMS stretcher to ED gurney.

## 2015-09-17 NOTE — Discharge Instructions (Signed)
Community Resource Guide Shelters °The [employer]’s “211” is a great source of information about community services available.  Access by dialing 2-1-1 from anywhere in [place], or by website -  www.nc211.org.  ° °Other Local Resources (Updated 07/2015) ° °Shelters  °Services   °Phone Number and Address  °[facility] • Housing for homeless and needy men with substance abuse issues 336-228-4096 °[address] [place], [place]  °[facility] • Emergency assistance °• Shelter °• Meals °• Pantry services 336-229-0881 °[place], [place]  °[facility] • Domestic violence shelter for women and their children 336-387-6161 °[place], [place]  °[facility] • Domestic violence shelter for women and their children 336-226-5982 °[place], [place]  °[facility]   ° • The [facility] coordinates access to most shelters in [place] °• Apply in person Monday - Friday, 10 am - 4 pm.   °• After hours/ weekends, contact individual shelters directly 336-332-0824 °[address] [place], [place]  °[facility] [place] [facility]  • Housing °• Food °• Emergency financial assistance °• Permanent supportive housing [phone] °[address] [place], [place]   °[facility]  • Crisis assistance °• Medication °• Housing °• Food °• Utility assistance 336-226-4462 °[address] [place], [place]  °[facility]  ° • Crisis assistance °• Medication °• Housing °• Food °• Utility assistance 336-349-4923 °[address], [place], [place]  °[facility] ° ° ° • Transitional housing °• Case management °• Utility assistance °• Food °• Clothing °• Transportation assistance 336-273-5572 °[address] [place], [place]  °[facility] • Shelter for adult men and women [phone] °[address] °[place],   °24-hour Crisis Line for those Facing Homelessness     [phone]  ° °

## 2015-09-17 NOTE — ED Notes (Signed)
Bed: WA14 Expected date:  Expected time:  Means of arrival:  Comments: EMS: ETOH 

## 2015-09-19 ENCOUNTER — Emergency Department (HOSPITAL_COMMUNITY)
Admission: EM | Admit: 2015-09-19 | Discharge: 2015-09-19 | Disposition: A | Discharge status: Home / Self Care | Payer: Medicaid Other | Attending: Emergency Medicine | Admitting: Emergency Medicine

## 2015-09-19 ENCOUNTER — Encounter (HOSPITAL_COMMUNITY): Payer: Self-pay | Admitting: *Deleted

## 2015-09-19 ENCOUNTER — Emergency Department (HOSPITAL_COMMUNITY): Payer: Medicaid Other

## 2015-09-19 DIAGNOSIS — Z791 Long term (current) use of non-steroidal anti-inflammatories (NSAID): Secondary | ICD-10-CM | POA: Diagnosis not present

## 2015-09-19 DIAGNOSIS — F17210 Nicotine dependence, cigarettes, uncomplicated: Secondary | ICD-10-CM | POA: Insufficient documentation

## 2015-09-19 DIAGNOSIS — W19XXXA Unspecified fall, initial encounter: Secondary | ICD-10-CM

## 2015-09-19 DIAGNOSIS — Y9301 Activity, walking, marching and hiking: Secondary | ICD-10-CM | POA: Insufficient documentation

## 2015-09-19 DIAGNOSIS — E119 Type 2 diabetes mellitus without complications: Secondary | ICD-10-CM | POA: Diagnosis not present

## 2015-09-19 DIAGNOSIS — S79911A Unspecified injury of right hip, initial encounter: Secondary | ICD-10-CM | POA: Diagnosis not present

## 2015-09-19 DIAGNOSIS — F319 Bipolar disorder, unspecified: Secondary | ICD-10-CM | POA: Insufficient documentation

## 2015-09-19 DIAGNOSIS — Z792 Long term (current) use of antibiotics: Secondary | ICD-10-CM | POA: Insufficient documentation

## 2015-09-19 DIAGNOSIS — Y92521 Bus station as the place of occurrence of the external cause: Secondary | ICD-10-CM | POA: Diagnosis not present

## 2015-09-19 DIAGNOSIS — S59901A Unspecified injury of right elbow, initial encounter: Secondary | ICD-10-CM | POA: Diagnosis not present

## 2015-09-19 DIAGNOSIS — I10 Essential (primary) hypertension: Secondary | ICD-10-CM | POA: Insufficient documentation

## 2015-09-19 DIAGNOSIS — Z7984 Long term (current) use of oral hypoglycemic drugs: Secondary | ICD-10-CM | POA: Insufficient documentation

## 2015-09-19 DIAGNOSIS — Z79899 Other long term (current) drug therapy: Secondary | ICD-10-CM | POA: Insufficient documentation

## 2015-09-19 DIAGNOSIS — Y998 Other external cause status: Secondary | ICD-10-CM | POA: Insufficient documentation

## 2015-09-19 DIAGNOSIS — W01198A Fall on same level from slipping, tripping and stumbling with subsequent striking against other object, initial encounter: Secondary | ICD-10-CM | POA: Insufficient documentation

## 2015-09-19 DIAGNOSIS — M79605 Pain in left leg: Secondary | ICD-10-CM | POA: Diagnosis present

## 2015-09-19 NOTE — ED Notes (Signed)
Bed: [id number] Expected date:  Expected time:  Means of arrival:  Comments: FAll

## 2015-09-19 NOTE — ED Notes (Signed)
Pt called EMS  Said he has left leg pain,  No known injury,  He [place] [employer], states it hurts worse to walk

## 2015-09-19 NOTE — ED Notes (Signed)
Pt taken to CT scan and returned to room without distress noted. 

## 2015-09-19 NOTE — Discharge Instructions (Signed)

## 2015-09-19 NOTE — ED Provider Notes (Signed)
CSN: [id number]     Arrival date & time 09/19/15  1709 History   First MD Initiated Contact with Patient 09/19/15 1739     Chief Complaint  Patient presents with  . Fall     (Consider location/radiation/quality/duration/timing/severity/associated sxs/prior Treatment) HPI Comments: Patient presents to the emergency department with chief complaint of fall. States that [place] when his cane slipped, and he fell to the ground. He felt her backward, striking his head on the ground. He states that he passed out briefly. He complains of right elbow pain and right hip pain. He denies any other complaints. There are no modifying factors. He has not taken anything for his symptoms.  The history is provided by the patient. No language interpreter was used.    Past Medical History  Diagnosis Date  . Lumbar spinal cord injury (HCC) 2012  . Bipolar disorder (HCC)   . Depression   . Hypertension   . Diabetes mellitus without complication [facility](HCC)    Past Surgical History  Procedure Laterality Date  . Back surgery    . Eye surgery     No family history on file. Social History  Substance Use Topics  . Smoking status: Current Every Day Smoker -- 0.50 packs/day for 10 years    Types: Cigarettes  . Smokeless tobacco: None  . Alcohol Use: Yes     Comment: "Occasional"     Review of Systems  All other systems reviewed and are negative.     Allergies  Haldol  Home Medications   Prior to Admission medications   Medication Sig Start Date End Date Taking? Authorizing Provider  cyclobenzaprine (FLEXERIL) 10 MG tablet Take 1 tablet (10 mg total) by mouth 2 (two) times daily as needed for muscle spasms. 07/29/15   Elpidio AnisShari Upstill, PA-C  ibuprofen (ADVIL,MOTRIN) 800 MG tablet Take 1 tablet (800 mg total) by mouth 3 (three) times daily. Patient taking differently: Take 800 mg by mouth every 6 (six) hours as needed for moderate pain.  08/14/15   Dione Boozeavid Glick, MD  lisinopril  (PRINIVIL,ZESTRIL) 20 MG tablet Take 1 tablet (20 mg total) by mouth daily. 09/08/15   Shawn C Joy, PA-C  meloxicam (MOBIC) 7.5 MG tablet Take 2 tablets (15 mg total) by mouth daily. 07/26/15   Antony MaduraKelly Humes, PA-C  metFORMIN (GLUCOPHAGE) 500 MG tablet Take 1 tablet (500 mg total) by mouth 2 (two) times daily with a meal. 09/08/15   Shawn C Joy, PA-C  risperiDONE (RISPERIDONE M-TAB) 1 MG disintegrating tablet Take 1 tablet (1 mg total) by mouth 2 (two) times daily. 09/08/15   Shawn C Joy, PA-C  silver sulfADIAZINE (SILVADENE) 1 % cream Apply 1 application topically daily. 08/14/15   Dione Boozeavid Glick, MD  tamsulosin (FLOMAX) 0.4 MG CAPS capsule Take 1 capsule (0.4 mg total) by mouth daily. 09/08/15   Shawn C Joy, PA-C  traZODone (DESYREL) 50 MG tablet Take 1 tablet (50 mg total) by mouth at bedtime. 07/28/15   Earney NavyJosephine C Onuoha, NP   BP 105/65 mmHg  Pulse 85  Temp(Src) 97.3 F (36.3 C) (Oral)  Resp 18  Ht 5\' 7"  (1.702 m)  Wt 80.287 kg  BMI 27.72 kg/m2  SpO2 100% Physical Exam  Constitutional: He is oriented to person, place, and time. He appears well-developed and well-nourished.  HENT:  Head: Normocephalic and atraumatic.  Eyes: Conjunctivae and EOM are normal. Pupils are equal, round, and reactive to light. Right eye exhibits no discharge. Left eye  exhibits no discharge. No scleral icterus.  Neck: Normal range of motion. Neck supple. No JVD present.  Cardiovascular: Normal rate, regular rhythm and normal heart sounds.  Exam reveals no gallop and no friction rub.   No murmur heard. Pulmonary/Chest: Effort normal and breath sounds normal. No respiratory distress. He has no wheezes. He has no rales. He exhibits no tenderness.  Abdominal: Soft. He exhibits no distension and no mass. There is no tenderness. There is no rebound and no guarding.  Musculoskeletal: Normal range of motion. He exhibits no edema or tenderness.  No bony abnormality or deformity throughout, mild tenderness over right elbow and right  hip Moves all extremities  Neurological: He is alert and oriented to person, place, and time.  Skin: Skin is warm and dry.  Psychiatric: He has a normal mood and affect. His behavior is normal. Judgment and thought content normal.  Nursing note and vitals reviewed.   ED Course  Procedures (including critical care time) Labs Review Labs Reviewed - No data to display  Imaging Review Dg Elbow Complete Right  09/19/2015  CLINICAL DATA:  Slipped on concrete and fell. EXAM: RIGHT ELBOW - COMPLETE 3+ VIEW COMPARISON:  None. FINDINGS: There is no evidence of fracture, dislocation, or joint effusion. Mild degenerative changes noted. Soft tissues are unremarkable. IMPRESSION: Negative. Electronically Signed   By: Signa Kell M.D.   On: 09/19/2015 19:07   Ct Head Wo Contrast  09/19/2015  CLINICAL DATA:  55 year old male with fall and trauma to the head EXAM: CT HEAD WITHOUT CONTRAST CT CERVICAL SPINE WITHOUT CONTRAST TECHNIQUE: Multidetector CT imaging of the head and cervical spine was performed following the standard protocol without intravenous contrast. Multiplanar CT image reconstructions of the cervical spine were also generated. COMPARISON:  Head CT dated 09/09/2015 FINDINGS: CT HEAD FINDINGS The ventricles and sulci are appropriate size for age. Minimal periventricular and deep white matter chronic microvascular ischemic changes noted. No acute intracranial hemorrhage. No mass effect or midline shift noted. The visualized paranasal sinuses and mastoid air cells are clear. The calvarium is intact. CT CERVICAL SPINE FINDINGS There is no acute fracture or subluxation of the cervical spine.There multilevel degenerative changes. C3-C5 disc spacer and anterior fusion plate and screws noted.The odontoid and spinous processes are intact.There is normal anatomic alignment of the C1-C2 lateral masses. The visualized soft tissues appear unremarkable. IMPRESSION: No acute intracranial pathology. No acute/  traumatic cervical spine pathology. Electronically Signed   By: Elgie Collard M.D.   On: 09/19/2015 19:28   Ct Cervical Spine Wo Contrast  09/19/2015  CLINICAL DATA:  55 year old male with fall and trauma to the head EXAM: CT HEAD WITHOUT CONTRAST CT CERVICAL SPINE WITHOUT CONTRAST TECHNIQUE: Multidetector CT imaging of the head and cervical spine was performed following the standard protocol without intravenous contrast. Multiplanar CT image reconstructions of the cervical spine were also generated. COMPARISON:  Head CT dated 09/09/2015 FINDINGS: CT HEAD FINDINGS The ventricles and sulci are appropriate size for age. Minimal periventricular and deep white matter chronic microvascular ischemic changes noted. No acute intracranial hemorrhage. No mass effect or midline shift noted. The visualized paranasal sinuses and mastoid air cells are clear. The calvarium is intact. CT CERVICAL SPINE FINDINGS There is no acute fracture or subluxation of the cervical spine.There multilevel degenerative changes. C3-C5 disc spacer and anterior fusion plate and screws noted.The odontoid and spinous processes are intact.There is normal anatomic alignment of the C1-C2 lateral masses. The visualized soft tissues appear unremarkable. IMPRESSION: No acute  intracranial pathology. No acute/ traumatic cervical spine pathology. Electronically Signed   By: Elgie Collard M.D.   On: 09/19/2015 19:28   Dg Hip Unilat W Or W/o Pelvis Min 4 Views Right  09/19/2015  CLINICAL DATA:  Fall. Right hip and pelvic pain. On anticoagulation. Initial encounter. EXAM: DG HIP (WITH OR WITHOUT PELVIS) 4+V RIGHT COMPARISON:  None. FINDINGS: There is no evidence of hip fracture or dislocation. No evidence of acetabular or pelvic fracture. 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 IMPRESSION: No acute findings. Electronically Signed   By: Myles Rosenthal M.D.   On: 09/19/2015 19:08   I have personally reviewed and evaluated these images and  lab results as part of my medical decision-making.    MDM   Final diagnoses:  Fall, initial encounter    Patient with mechanical fall today. Images are negative. No further workup. Will discharge to home with primary care follow-up.    Roxy Horseman, PA-C 09/19/15 1935  Nelva Nay, MD 09/21/15 [phone]

## 2015-09-19 NOTE — ED Notes (Signed)
Called pt in lobby at this time, no response, tech reports pt is up at the road near [place] bus stop

## 2015-09-19 NOTE — ED Notes (Signed)
[place] and [place]ain slipped on concreat and pt fell.  (Uses cain for chronic low back pain).  Pt is not on blood thinners.  Pt sts did pass out when hitting back of head.  No deformity noted.  Is complain of lumbar back pain (chronic) and back of head pain at this time

## 2015-09-19 NOTE — ED Notes (Signed)
Called pt in lobby to assess vitals, no answer at this time

## 2015-12-19 ENCOUNTER — Emergency Department (HOSPITAL_COMMUNITY): Payer: Medicaid Other

## 2015-12-19 ENCOUNTER — Emergency Department (HOSPITAL_COMMUNITY)
Admission: EM | Admit: 2015-12-19 | Discharge: 2015-12-19 | Disposition: A | Discharge status: Home / Self Care | Payer: Medicaid Other | Attending: Emergency Medicine | Admitting: Emergency Medicine

## 2015-12-19 ENCOUNTER — Emergency Department (HOSPITAL_COMMUNITY)
Admission: EM | Admit: 2015-12-19 | Discharge: 2015-12-19 | Disposition: A | Discharge status: Home / Self Care | Payer: Medicaid Other | Source: Home / Self Care | Attending: Emergency Medicine | Admitting: Emergency Medicine

## 2015-12-19 ENCOUNTER — Encounter (HOSPITAL_COMMUNITY): Payer: Self-pay | Admitting: Emergency Medicine

## 2015-12-19 ENCOUNTER — Encounter (HOSPITAL_COMMUNITY): Payer: Self-pay

## 2015-12-19 DIAGNOSIS — F17210 Nicotine dependence, cigarettes, uncomplicated: Secondary | ICD-10-CM | POA: Insufficient documentation

## 2015-12-19 DIAGNOSIS — S29012A Strain of muscle and tendon of back wall of thorax, initial encounter: Secondary | ICD-10-CM | POA: Diagnosis not present

## 2015-12-19 DIAGNOSIS — Z79899 Other long term (current) drug therapy: Secondary | ICD-10-CM

## 2015-12-19 DIAGNOSIS — Y998 Other external cause status: Secondary | ICD-10-CM | POA: Diagnosis not present

## 2015-12-19 DIAGNOSIS — W010XXA Fall on same level from slipping, tripping and stumbling without subsequent striking against object, initial encounter: Secondary | ICD-10-CM | POA: Insufficient documentation

## 2015-12-19 DIAGNOSIS — Y939 Activity, unspecified: Secondary | ICD-10-CM

## 2015-12-19 DIAGNOSIS — W01198A Fall on same level from slipping, tripping and stumbling with subsequent striking against other object, initial encounter: Secondary | ICD-10-CM | POA: Insufficient documentation

## 2015-12-19 DIAGNOSIS — Y999 Unspecified external cause status: Secondary | ICD-10-CM

## 2015-12-19 DIAGNOSIS — Y9289 Other specified places as the place of occurrence of the external cause: Secondary | ICD-10-CM | POA: Insufficient documentation

## 2015-12-19 DIAGNOSIS — I10 Essential (primary) hypertension: Secondary | ICD-10-CM | POA: Insufficient documentation

## 2015-12-19 DIAGNOSIS — Y9301 Activity, walking, marching and hiking: Secondary | ICD-10-CM

## 2015-12-19 DIAGNOSIS — Z791 Long term (current) use of non-steroidal anti-inflammatories (NSAID): Secondary | ICD-10-CM

## 2015-12-19 DIAGNOSIS — S199XXA Unspecified injury of neck, initial encounter: Secondary | ICD-10-CM | POA: Diagnosis present

## 2015-12-19 DIAGNOSIS — S39012A Strain of muscle, fascia and tendon of lower back, initial encounter: Secondary | ICD-10-CM

## 2015-12-19 DIAGNOSIS — S50312A Abrasion of left elbow, initial encounter: Secondary | ICD-10-CM | POA: Insufficient documentation

## 2015-12-19 DIAGNOSIS — S161XXA Strain of muscle, fascia and tendon at neck level, initial encounter: Secondary | ICD-10-CM | POA: Diagnosis not present

## 2015-12-19 DIAGNOSIS — G8929 Other chronic pain: Secondary | ICD-10-CM

## 2015-12-19 DIAGNOSIS — M79632 Pain in left forearm: Secondary | ICD-10-CM

## 2015-12-19 DIAGNOSIS — F329 Major depressive disorder, single episode, unspecified: Secondary | ICD-10-CM

## 2015-12-19 DIAGNOSIS — Y92480 Sidewalk as the place of occurrence of the external cause: Secondary | ICD-10-CM | POA: Insufficient documentation

## 2015-12-19 DIAGNOSIS — E119 Type 2 diabetes mellitus without complications: Secondary | ICD-10-CM

## 2015-12-19 DIAGNOSIS — R739 Hyperglycemia, unspecified: Secondary | ICD-10-CM

## 2015-12-19 DIAGNOSIS — E1165 Type 2 diabetes mellitus with hyperglycemia: Secondary | ICD-10-CM | POA: Diagnosis not present

## 2015-12-19 DIAGNOSIS — Z7984 Long term (current) use of oral hypoglycemic drugs: Secondary | ICD-10-CM

## 2015-12-19 DIAGNOSIS — Y929 Unspecified place or not applicable: Secondary | ICD-10-CM

## 2015-12-19 DIAGNOSIS — Y9389 Activity, other specified: Secondary | ICD-10-CM | POA: Diagnosis not present

## 2015-12-19 DIAGNOSIS — F319 Bipolar disorder, unspecified: Secondary | ICD-10-CM | POA: Insufficient documentation

## 2015-12-19 DIAGNOSIS — S79922A Unspecified injury of left thigh, initial encounter: Secondary | ICD-10-CM | POA: Insufficient documentation

## 2015-12-19 DIAGNOSIS — E860 Dehydration: Secondary | ICD-10-CM | POA: Diagnosis not present

## 2015-12-19 DIAGNOSIS — W19XXXA Unspecified fall, initial encounter: Secondary | ICD-10-CM

## 2015-12-19 DIAGNOSIS — M25522 Pain in left elbow: Secondary | ICD-10-CM

## 2015-12-19 LAB — CBC WITH DIFFERENTIAL/PLATELET
BASOS ABS: 0 10*3/uL (ref 0.0–0.1)
BASOS PCT: 0 %
EOS ABS: 0.3 10*3/uL (ref 0.0–0.7)
EOS PCT: 3 %
HEMATOCRIT: 37.9 % — AB (ref 39.0–52.0)
HEMOGLOBIN: 12.6 g/dL — AB (ref 13.0–17.0)
Lymphocytes Relative: 27 %
Lymphs Abs: 2.9 10*3/uL (ref 0.7–4.0)
MCH: 27 pg (ref 26.0–34.0)
MCHC: 33.2 g/dL (ref 30.0–36.0)
MCV: 81.2 fL (ref 78.0–100.0)
Monocytes Absolute: 1.2 10*3/uL — ABNORMAL HIGH (ref 0.1–1.0)
Monocytes Relative: 11 %
Neutro Abs: 6.5 10*3/uL (ref 1.7–7.7)
Neutrophils Relative %: 59 %
Platelets: 245 10*3/uL (ref 150–400)
RBC: 4.67 MIL/uL (ref 4.22–5.81)
RDW: 14.9 % (ref 11.5–15.5)
WBC: 10.9 10*3/uL — AB (ref 4.0–10.5)

## 2015-12-19 LAB — BASIC METABOLIC PANEL
Anion gap: 12 (ref 5–15)
BUN: 7 mg/dL (ref 6–20)
CALCIUM: 9 mg/dL (ref 8.9–10.3)
CO2: 17 mmol/L — ABNORMAL LOW (ref 22–32)
CREATININE: 1.01 mg/dL (ref 0.61–1.24)
Chloride: 108 mmol/L (ref 101–111)
GFR calc Af Amer: >60 mL/min (ref >60)
Glucose, Bld: 227 mg/dL — ABNORMAL HIGH (ref 65–99)
Potassium: 3.9 mmol/L (ref 3.5–5.1)
SODIUM: 137 mmol/L (ref 135–145)

## 2015-12-19 LAB — CBG MONITORING, ED: GLUCOSE-CAPILLARY: 238 mg/dL — AB (ref 65–99)

## 2015-12-19 LAB — ETHANOL: Alcohol, Ethyl (B): 27 mg/dL — ABNORMAL HIGH (ref <5)

## 2015-12-19 MED ORDER — MELOXICAM 7.5 MG PO TABS: 15.0000 mg | ORAL_TABLET | Freq: Every day | ORAL | Status: DC

## 2015-12-19 MED ORDER — SODIUM CHLORIDE 0.9 % IV BOLUS (SEPSIS)
1000.0000 mL | Freq: Once | INTRAVENOUS | Status: AC
  Administered 2015-12-19: 1000 mL via INTRAVENOUS

## 2015-12-19 MED ORDER — KETOROLAC TROMETHAMINE 30 MG/ML IJ SOLN
30.0000 mg | Freq: Once | INTRAMUSCULAR | Status: AC
  Administered 2015-12-19: 30 mg via INTRAMUSCULAR
  Filled 2015-12-19: qty 1

## 2015-12-19 MED ORDER — METFORMIN HCL 500 MG PO TABS: 500.0000 mg | ORAL_TABLET | Freq: Two times a day (BID) | ORAL | Status: DC

## 2015-12-19 NOTE — Discharge Instructions (Signed)
You have neck pain, possibly from a cervical strain and/or pinched nerve.   SEEK IMMEDIATE MEDICAL ATTENTION IF: You develop difficulties swallowing or breathing.  You have new or worse numbness, weakness, tingling, or movement problems in your arms or legs.  You develop increasing pain which is uncontrolled with medications.  You have change in bowel or bladder function, or other concerns. The [employer] [facility] [employer] Shelters The [employer] 211 is a great source of information about community services available.  Access by dialing 2-1-1 from anywhere in [place], or by website -  PooledIncome.plwww.nc211.org.   Other Local Resources (Updated 07/2015)  Shelters  Services   Phone Number and Address  [facility]  Housing for homeless and needy men with substance abuse issues [phone] [address] [place], [place]  [facility] [place] [facility]  Emergency assistance  [facility]  Meals  Pantry services [phone] [place], [place]  [facility]  Domestic violence shelter for women and their children [phone] [place], [place]  [facility]  Domestic violence shelter for women and their children [phone] [place], [place]  [facility](IRC)     The [facility] coordinates access to most shelters in [place]  Apply in person Monday - Friday, 10 am - 4 pm.    After hours/ weekends, contact individual shelters directly [phone] [address] [place], [place]  [facility]   Housing  Food  Emergency financial assistance  Permanent supportive housing [phone] [address] [place], [place]   [facility]   Crisis assistance  Medication  Housing  Food  Utility assistance [phone] [address] [place], [place]  [facility]    Crisis assistance  Medication  Housing  Food  Utility assistance [phone] [address], [place], [place]   [facility]     Transitional housing  [employer]  Utility assistance  Food  Clothing  Transportation assistance [phone] [address] [place], [place]  [facility] [place] [facility]  Shelter for adult men and women [phone] [address] [place], [place]  24-hour Crisis Line for those Facing Homelessness    [phone]

## 2015-12-19 NOTE — ED Notes (Signed)
Per EMS pt complaint generalized pain post unwitnessed fall; [facility]. Pt reports homeless and shelter is full.

## 2015-12-19 NOTE — Progress Notes (Addendum)
CSW attempted to reach local shelters without success.  The [facility] also closed at this time.  CSW can assist patient with bus pass and he can attempt to stay in lobby of [facility] if they are full.  Seward SpeckLeo Kaylee Trivett [facility] CenterCSW,LCAS Behavioral Health Disposition CSW [phone]

## 2015-12-19 NOTE — ED Notes (Signed)
Per EMS: Pt states he fell and can no longer feel feet. Denies ETOH. Smells of ETOH upon arrival. Pt complaining of back and neck pain. Pt on spine board. Pt states unable to move feet. Pt moving feet per EMS.

## 2015-12-19 NOTE — ED Provider Notes (Signed)
CSN: [id number]     Arrival date & time 12/19/15  0130 History  By signing my name below, I, Doreatha Martin, attest that this documentation has been prepared under the direction and in the presence of Zadie Rhine, MD. Electronically Signed: Doreatha Martin, ED Scribe. 12/19/2015. 1:57 AM.     Chief Complaint  Patient presents with  . Fall  . Back Pain   LEVEL 5 CAVEAT: HPI and ROS limited due to AMS    Patient is a 55 y.o. male presenting with fall. The history is provided by the patient. History limited by: AMS. No language interpreter was used.  Fall This is a new problem. The current episode started 1 to 2 hours ago. The problem has not changed since onset.Nothing aggravates the symptoms. Nothing relieves the symptoms. He has tried nothing for the symptoms. The treatment provided no relief.   HPI Comments: Charles Leon is a 55 y.o. male brought in by ambulance who presents to the Emergency Department in a C-collar complaining of neck pain and back pain s/p unwitnessed fall that occurred just PTA. Per pt, he tripped and fell backward, striking the back of his head on the ground. He denies LOC. Pt also denies ETOH consumption this evening. He also complains of left thigh pain at this time.   Past Medical History  Diagnosis Date  . Lumbar spinal cord injury (HCC) 2012  . Bipolar disorder (HCC)   . Depression   . Hypertension   . Diabetes mellitus without complication [facility])    Past Surgical History  Procedure Laterality Date  . Back surgery    . Eye surgery     History reviewed. No pertinent family history. Social History  Substance Use Topics  . Smoking status: Current Every Day Smoker -- 0.50 packs/day for 10 years    Types: Cigarettes  . Smokeless tobacco: None  . Alcohol Use: Yes     Comment: "Occasional"     Review of Systems  Unable to perform ROS: Mental status change   Allergies  Haldol  Home Medications   Prior to Admission medications   Medication Sig Start Date  End Date Taking? Authorizing Provider  cyclobenzaprine (FLEXERIL) 10 MG tablet Take 1 tablet (10 mg total) by mouth 2 (two) times daily as needed for muscle spasms. 07/29/15   Elpidio Anis, PA-C  ibuprofen (ADVIL,MOTRIN) 800 MG tablet Take 1 tablet (800 mg total) by mouth 3 (three) times daily. Patient taking differently: Take 800 mg by mouth every 6 (six) hours as needed for moderate pain.  08/14/15   Dione Booze, MD  lisinopril (PRINIVIL,ZESTRIL) 20 MG tablet Take 1 tablet (20 mg total) by mouth daily. 09/08/15   Shawn C Joy, PA-C  meloxicam (MOBIC) 7.5 MG tablet Take 2 tablets (15 mg total) by mouth daily. 07/26/15   Antony Madura, PA-C  metFORMIN (GLUCOPHAGE) 500 MG tablet Take 1 tablet (500 mg total) by mouth 2 (two) times daily with a meal. 09/08/15   Shawn C Joy, PA-C  risperiDONE (RISPERIDONE M-TAB) 1 MG disintegrating tablet Take 1 tablet (1 mg total) by mouth 2 (two) times daily. 09/08/15   Shawn C Joy, PA-C  silver sulfADIAZINE (SILVADENE) 1 % cream Apply 1 application topically daily. 08/14/15   Dione Booze, MD  tamsulosin (FLOMAX) 0.4 MG CAPS capsule Take 1 capsule (0.4 mg total) by mouth daily. 09/08/15   Shawn C Joy, PA-C  traZODone (DESYREL) 50 MG tablet Take 1 tablet (50 mg total) by mouth at bedtime. 07/28/15  Earney Navy, NP   BP 132/111 mmHg  Pulse 96  Temp(Src) 97.6 F (36.4 C) (Oral)  Resp 20  SpO2 94% Physical Exam CONSTITUTIONAL: Disheveled  HEAD: Normocephalic/atraumatic EYES: EOMI/PERRL ENMT: Mucous membranes moist NECK: supple no meningeal signs SPINE/BACK: diffuse CTL tenderness, Patient maintained in spinal precautions/logroll utilized, No bruising/crepitance/stepoffs noted to spine  CV: S1/S2 noted, no murmurs/rubs/gallops noted LUNGS: Lungs are clear to auscultation bilaterally, no apparent distress ABDOMEN: soft, nontender, no rebound or guarding, bowel sounds noted throughout abdomen NEURO: Pt is somnolent, but easily arousable, answers questions  appropriately. Able to move all extremities, but limited d/t pain in left leg.  EXTREMITIES: pulses normal/equal, full ROM. TTP of the left thigh, no deformity or bruising. All other extremities/joints palpated/ranged and nontender  SKIN: warm, color normal PSYCH: no abnormalities of mood noted, alert and oriented to situation   ED Course  Procedures  COORDINATION OF CARE: 1:51 AM Discussed treatment plan with pt at bedside which includes XR and pt agreed to plan.  3:36 AM Pt more awake/alert He moves all extremities without difficulty He reports he only has back pain which is chronic Denies neck pain at this time He reports he got into an argument tonight and fell down  After monitoring in the ED, pt was walking around with his cane, no distress, walking and eating a sandwich without difficulty He requests information on shelters Patient discharged  Labs Review Labs Reviewed  BASIC METABOLIC PANEL - Abnormal; Notable for the following:    CO2 17 (*)    Glucose, Bld 227 (*)    All other components within normal limits  CBC WITH DIFFERENTIAL/PLATELET - Abnormal; Notable for the following:    WBC 10.9 (*)    Hemoglobin 12.6 (*)    HCT 37.9 (*)    Monocytes Absolute 1.2 (*)    All other components within normal limits  ETHANOL - Abnormal; Notable for the following:    Alcohol, Ethyl (B) 27 (*)    All other components within normal limits  CBG MONITORING, ED - Abnormal; Notable for the following:    Glucose-Capillary 238 (*)    All other components within normal limits    Imaging Review Dg Cervical Spine Complete  12/19/2015  CLINICAL DATA:  Fall today with neck pain. Initial encounter. EXAM: CERVICAL SPINE - COMPLETE 4+ VIEW COMPARISON:  09/19/2015 CT FINDINGS: Partially obscured C7 vertebra on the lateral projection. C7 is best seen on thoracic spine radiography. No evidence of cervical spine fracture or traumatic malalignment. C3-4 and C4-5 anterior cervical discectomy with  ventral plate and screw fixation. There is solid bony fusion at both levels. No prevertebral thickening. IMPRESSION: 1. Partially obscured C7 vertebra. 2. No evidence of cervical spine injury. 3. C3-4 and C4-5 discectomy with solid bony fusion. Electronically Signed   By: Marnee Spring M.D.   On: 12/19/2015 03:02   Dg Thoracic Spine 2 View  12/19/2015  CLINICAL DATA:  Initial evaluation for acute trauma, fall today. EXAM: THORACIC SPINE 2 VIEWS COMPARISON:  None. FINDINGS: Mild dextroscoliosis, which may be related to patient positioning. Vertebral body heights preserved. No acute fracture or subluxation. Upper thoracic spine not well visualized on images provided. Scatter mild multilevel degenerative endplate spurring present within the mid and lower thoracic spine. Visualized soft tissues within normal limits. IMPRESSION: No radiographic evidence for acute traumatic injury within the thoracic spine. Electronically Signed   By: Rise Mu M.D.   On: 12/19/2015 03:04   Dg Lumbar Spine Complete  12/19/2015  CLINICAL DATA:  Initial evaluation for acute trauma, fall. EXAM: LUMBAR SPINE - COMPLETE 4+ VIEW COMPARISON:  Prior study from 12/16/2012. FINDINGS: Five non rib-bearing lumbar type vertebral bodies are present. Vertebral bodies normally aligned with preservation of the normal lumbar lordosis. No listhesis or subluxation. Vertebral body heights maintained. No fracture. Visualized sacrum intact. SI joints approximated. No soft tissue abnormality. IMPRESSION: No radiographic evidence for acute traumatic injury within the lumbar spine. Electronically Signed   By: Rise MuBenjamin  McClintock M.D.   On: 12/19/2015 03:06   Dg Femur Min 2 Views Left  12/19/2015  CLINICAL DATA:  Initial evaluation for acute trauma, fall. EXAM: LEFT FEMUR 2 VIEWS COMPARISON:  None available. FINDINGS: No acute fracture or dislocation. Limited views of the left hip in the demonstrate no acute abnormality. Degenerative  osteoarthritic changes about the hip and knee. No acute soft tissue abnormality. Tubular re- radiopaque density overlying the left groin likely lies external to the patient. Limited views of pelvis demonstrate no acute abnormality. IMPRESSION: 1. No acute fracture or dislocation. 2. 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 Electronically Signed   By: Rise MuBenjamin  McClintock M.D.   On: 12/19/2015 03:09   I have personally reviewed and evaluated these images and lab results as part of my medical decision-making.   MDM   Final diagnoses:  Fall, initial encounter  Cervical strain, acute, initial encounter  Back strain, initial encounter  Hyperglycemia  Dehydration    Nursing notes including past medical history and social history reviewed and considered in documentation xrays/imaging reviewed by myself and considered during evaluation Labs/vital reviewed myself and considered during evaluation   I personally performed the services described in this documentation, which was scribed in my presence. The recorded information has been reviewed and is accurate.       Zadie Rhineonald Honestie Kulik, MD 12/19/15 [phone]
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
## 2015-12-19 NOTE — Care Management Note (Signed)
Case Management Note  Patient Details  Name: Kendell Banendre Henri MRN: [id number] Date of Birth: 06-04-1961  Subjective/Objective:   Unwitnessed fall                 Action/Plan: Discharge Planning:  Pt has Medicaid and can request a copay waiver at his pharmacy when he picks up his medications. CSW assisting with locating a shelter for pt.     Expected Discharge Date:  12/19/2015              Expected Discharge Plan:  Homeless Shelter  In-House Referral:  Clinical Social Work  Discharge planning Services  CM Consult, Medication Assistance  Post Acute Care Choice:  NA Choice offered to:  NA  DME Arranged:  N/A DME Agency:  NA  HH Arranged:  NA HH Agency:  NA  Status of Service:  Completed, signed off  Medicare Important Message Given:    Date Medicare IM Given:    Medicare IM give by:    Date Additional Medicare IM Given:    Additional Medicare Important Message give by:     If discussed at Long Length of Stay Meetings, dates discussed:    Additional Comments:  Elliot CousinShavis, Dominika Losey Ellen, RN 12/19/2015, 4:56 PM
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
## 2015-12-19 NOTE — Discharge Instructions (Signed)
Go to nearest pharmacy to get your metformin and lisinopril and take as directed. Follow-up with your primary care provider within 2 days regarding your visit to the emergency department.  Return to the emergency department if you experience dizziness, you pass out, chest pain, shortness of breath, fever, chills.  Musculoskeletal Pain Musculoskeletal pain is muscle and boney aches and pains. These pains can occur in any part of the body. Your caregiver may treat you without knowing the cause of the pain. They may treat you if blood or urine tests, X-rays, and other tests were normal.  CAUSES There is often not a definite cause or reason for these pains. These pains may be caused by a type of germ (virus). The discomfort may also come from overuse. Overuse includes working out too hard when your body is not fit. Boney aches also come from weather changes. Bone is sensitive to atmospheric pressure changes. HOME CARE INSTRUCTIONS   Ask when your test results will be ready. Make sure you get your test results.  Only take over-the-counter or prescription medicines for pain, discomfort, or fever as directed by your caregiver. If you were given medications for your condition, do not drive, operate machinery or power tools, or sign legal documents for 24 hours. Do not drink alcohol. Do not take sleeping pills or other medications that may interfere with treatment.  Continue all activities unless the activities cause more pain. When the pain lessens, slowly resume normal activities. Gradually increase the intensity and duration of the activities or exercise.  During periods of severe pain, bed rest may be helpful. Lay or sit in any position that is comfortable.  Putting ice on the injured area.  Put ice in a bag.  Place a towel between your skin and the bag.  Leave the ice on for 15 to 20 minutes, 3 to 4 times a day.  Follow up with your caregiver for continued problems and no reason can be found  for the pain. If the pain becomes worse or does not go away, it may be necessary to repeat tests or do additional testing. Your caregiver may need to look further for a possible cause. SEEK IMMEDIATE MEDICAL CARE IF:  You have pain that is getting worse and is not relieved by medications.  You develop chest pain that is associated with shortness or breath, sweating, feeling sick to your stomach (nauseous), or throw up (vomit).  Your pain becomes localized to the abdomen.  You develop any new symptoms that seem different or that concern you. MAKE SURE YOU:   Understand these instructions.  Will watch your condition.  Will get help right away if you are not doing well or get worse.   This information is not intended to replace advice given to you by your health care provider. Make sure you discuss any questions you have with your health care provider.   Document Released: 07/04/2005 Document Revised: 09/26/2011 Document Reviewed: 03/08/2013 Elsevier Interactive Patient Education [employer]2016 Elsevier Inc.

## 2015-12-20 ENCOUNTER — Emergency Department (HOSPITAL_COMMUNITY)
Admission: EM | Admit: 2015-12-20 | Discharge: 2015-12-20 | Disposition: A | Discharge status: Home / Self Care | Payer: Medicaid Other | Source: Home / Self Care | Attending: Emergency Medicine | Admitting: Emergency Medicine

## 2015-12-20 ENCOUNTER — Emergency Department (HOSPITAL_COMMUNITY)
Admission: EM | Admit: 2015-12-20 | Discharge: 2015-12-20 | Disposition: A | Discharge status: Home / Self Care | Payer: Medicaid Other | Attending: Emergency Medicine | Admitting: Emergency Medicine

## 2015-12-20 DIAGNOSIS — Y999 Unspecified external cause status: Secondary | ICD-10-CM | POA: Diagnosis not present

## 2015-12-20 DIAGNOSIS — W19XXXA Unspecified fall, initial encounter: Secondary | ICD-10-CM

## 2015-12-20 DIAGNOSIS — Z79899 Other long term (current) drug therapy: Secondary | ICD-10-CM | POA: Insufficient documentation

## 2015-12-20 DIAGNOSIS — I10 Essential (primary) hypertension: Secondary | ICD-10-CM | POA: Diagnosis not present

## 2015-12-20 DIAGNOSIS — G8929 Other chronic pain: Secondary | ICD-10-CM

## 2015-12-20 DIAGNOSIS — M79632 Pain in left forearm: Secondary | ICD-10-CM

## 2015-12-20 DIAGNOSIS — Y939 Activity, unspecified: Secondary | ICD-10-CM | POA: Insufficient documentation

## 2015-12-20 DIAGNOSIS — R269 Unspecified abnormalities of gait and mobility: Secondary | ICD-10-CM | POA: Diagnosis not present

## 2015-12-20 DIAGNOSIS — E119 Type 2 diabetes mellitus without complications: Secondary | ICD-10-CM | POA: Diagnosis not present

## 2015-12-20 DIAGNOSIS — F17210 Nicotine dependence, cigarettes, uncomplicated: Secondary | ICD-10-CM | POA: Diagnosis not present

## 2015-12-20 DIAGNOSIS — Z791 Long term (current) use of non-steroidal anti-inflammatories (NSAID): Secondary | ICD-10-CM | POA: Insufficient documentation

## 2015-12-20 DIAGNOSIS — Y92481 Parking lot as the place of occurrence of the external cause: Secondary | ICD-10-CM | POA: Diagnosis not present

## 2015-12-20 DIAGNOSIS — Z7984 Long term (current) use of oral hypoglycemic drugs: Secondary | ICD-10-CM | POA: Diagnosis not present

## 2015-12-20 DIAGNOSIS — Z0000 Encounter for general adult medical examination without abnormal findings: Secondary | ICD-10-CM

## 2015-12-20 MED ORDER — METFORMIN HCL 500 MG PO TABS: 500.0000 mg | ORAL_TABLET | Freq: Two times a day (BID) | ORAL | Status: DC

## 2015-12-20 MED ORDER — LISINOPRIL 10 MG PO TABS: 10.0000 mg | ORAL_TABLET | Freq: Every day | ORAL | Status: DC

## 2015-12-20 NOTE — Discharge Instructions (Signed)
Fall Prevention in the Home  Falls can cause injuries and can affect people from all age groups. There are many simple things that you can do to make your home safe and to help prevent falls. WHAT CAN I DO ON THE OUTSIDE OF MY HOME?  Regularly repair the edges of walkways and driveways and fix any cracks.  Remove high doorway thresholds.  Trim any shrubbery on the main path into your home.  Use bright outdoor lighting.  Clear walkways of debris and clutter, including tools and rocks.  Regularly check that handrails are securely fastened and in good repair. Both sides of any steps should have handrails.  Install guardrails along the edges of any raised decks or porches.  Have leaves, snow, and ice cleared regularly.  Use sand or salt on walkways during winter months.  In the garage, clean up any spills right away, including grease or oil spills. WHAT CAN I DO IN THE BATHROOM?  Use night lights.  Install grab bars by the toilet and in the tub and shower. Do not use towel bars as grab bars.  Use non-skid mats or decals on the floor of the tub or shower.  If you need to sit down while you are in the shower, use a plastic, non-slip stool..  Keep the floor dry. Immediately clean up any water that spills on the floor.  Remove soap buildup in the tub or shower on a regular basis.  Attach bath mats securely with double-sided non-slip rug tape.  Remove throw rugs and other tripping hazards from the floor. WHAT CAN I DO IN THE BEDROOM?  Use night lights.  Make sure that a bedside light is easy to reach.  Do not use oversized bedding that drapes onto the floor.  Have a firm chair that has side arms to use for getting dressed.  Remove throw rugs and other tripping hazards from the floor. WHAT CAN I DO IN THE KITCHEN?   Clean up any spills right away.  Avoid walking on wet floors.  Place frequently used items in easy-to-reach places.  If you need to reach for something  above you, use a sturdy step stool that has a grab bar.  Keep electrical cables out of the way.  Do not use floor polish or wax that makes floors slippery. If you have to use wax, make sure that it is non-skid floor wax.  Remove throw rugs and other tripping hazards from the floor. WHAT CAN I DO IN THE STAIRWAYS?  Do not leave any items on the stairs.  Make sure that there are handrails on both sides of the stairs. Fix handrails that are broken or loose. Make sure that handrails are as long as the stairways.  Check any carpeting to make sure that it is firmly attached to the stairs. Fix any carpet that is loose or worn.  Avoid having throw rugs at the top or bottom of stairways, or secure the rugs with carpet tape to prevent them from moving.  Make sure that you have a light switch at the top of the stairs and the bottom of the stairs. If you do not have them, have them installed. WHAT ARE SOME OTHER FALL PREVENTION TIPS?  Wear closed-toe shoes that fit well and support your feet. Wear shoes that have rubber soles or low heels.  When you use a stepladder, make sure that it is completely opened and that the sides are firmly locked. Have someone hold the ladder while you   are using it. Do not climb a closed stepladder.  Add color or contrast paint or tape to grab bars and handrails in your home. Place contrasting color strips on the first and last steps.  Use mobility aids as needed, such as canes, walkers, scooters, and crutches.  Turn on lights if it is dark. Replace any light bulbs that burn out.  Set up furniture so that there are clear paths. Keep the furniture in the same spot.  Fix any uneven floor surfaces.  Choose a carpet design that does not hide the edge of steps of a stairway.  Be aware of any and all pets.  Review your medicines with your healthcare provider. Some medicines can cause dizziness or changes in blood pressure, which increase your risk of falling. Talk  with your health care provider about other ways that you can decrease your risk of falls. This may include working with a physical therapist or trainer to improve your strength, balance, and endurance.   This information is not intended to replace advice given to you by your health care provider. Make sure you discuss any questions you have with your health care provider.   Document Released: 06/24/2002 Document Revised: 11/18/2014 Document Reviewed: 08/08/2014 Elsevier Interactive Patient Education 2016 Elsevier Inc.  

## 2015-12-20 NOTE — ED Provider Notes (Signed)
CSN: [id number]     Arrival date & time 12/20/15  0449 History   First MD Initiated Contact with Patient 12/20/15 [phone]     Chief Complaint  Patient presents with  . Fall     (Consider location/radiation/quality/duration/timing/severity/associated sxs/prior Treatment) Patient is a 55 y.o. male presenting with fall.  Fall Pertinent negatives include no chills, fever, joint swelling or weakness.    Charles Leon is a(n) 55 y.o. male who presents to the ED with c/o fall.patient seen earlier by my attending physician. Nursing notes state that the patient fell in the parking lot after being discharge. In the past 24 hours he has been seen 4 times for varying complaints and was panhandling in the ER at an earlier visit. He has 37 visits in the past 6 months. Patient states "nothing's wrong, I just need to sleep," and swats at me with his left arm. He denies head injury or LOC. He has generalized chronic pain.   Past Medical History  Diagnosis Date  . Lumbar spinal cord injury (HCC) 2012  . Bipolar disorder (HCC)   . Depression   . Hypertension   . Diabetes mellitus without complication [facility] [address](HCC)    Past Surgical History  Procedure Laterality Date  . Back surgery    . Eye surgery     No family history on file. Social History  Substance Use Topics  . Smoking status: Current Every Day Smoker -- 0.50 packs/day for 10 years    Types: Cigarettes  . Smokeless tobacco: Not on file  . Alcohol Use: Yes     Comment: "Occasional"     Review of Systems  Constitutional: Negative for fever and chills.  Musculoskeletal: Positive for gait problem. Negative for joint swelling.  Skin: Negative for wound.  Neurological: Negative for dizziness, weakness and light-headedness.      Allergies  Haldol  Home Medications   Prior to Admission medications   Medication Sig Start Date End Date Taking? Authorizing Provider  cyclobenzaprine (FLEXERIL) 10 MG tablet Take 1 tablet (10 mg total) by mouth 2  (two) times daily as needed for muscle spasms. 07/29/15   Elpidio AnisShari Upstill, PA-C  ibuprofen (ADVIL,MOTRIN) 800 MG tablet Take 1 tablet (800 mg total) by mouth 3 (three) times daily. Patient taking differently: Take 800 mg by mouth every 6 (six) hours as needed for moderate pain.  08/14/15   Dione Boozeavid Glick, MD  lisinopril (ZESTRIL) 10 MG tablet Take 1 tablet (10 mg total) by mouth daily. 12/20/15   Zadie Rhineonald Wickline, MD  meloxicam (MOBIC) 7.5 MG tablet Take 2 tablets (15 mg total) by mouth daily. 12/19/15   Jerre SimonJessica L Focht, PA  metFORMIN (GLUCOPHAGE) 500 MG tablet Take 1 tablet (500 mg total) by mouth 2 (two) times daily with a meal. 12/20/15   Zadie Rhineonald Wickline, MD  risperiDONE (RISPERIDONE M-TAB) 1 MG disintegrating tablet Take 1 tablet (1 mg total) by mouth 2 (two) times daily. 09/08/15   Shawn C Joy, PA-C  silver sulfADIAZINE (SILVADENE) 1 % cream Apply 1 application topically daily. 08/14/15   Dione Boozeavid Glick, MD  tamsulosin (FLOMAX) 0.4 MG CAPS capsule Take 1 capsule (0.4 mg total) by mouth daily. 09/08/15   Shawn C Joy, PA-C  traZODone (DESYREL) 50 MG tablet Take 1 tablet (50 mg total) by mouth at bedtime. 07/28/15   Earney NavyJosephine C Onuoha, NP   Pulse 104  Temp(Src) 98.3 F (36.8 C)  Resp 22 Physical Exam  Constitutional: He appears well-developed and well-nourished. No distress.  Sleeping.  HENT:  Head: Normocephalic and atraumatic.  Eyes: Conjunctivae are normal. No scleral icterus.  Neck: Normal range of motion. Neck supple.  Cardiovascular: Normal rate, regular rhythm and normal heart sounds.   Pulmonary/Chest: Effort normal and breath sounds normal. No respiratory distress.  Abdominal: Soft. There is no tenderness.  Musculoskeletal: He exhibits no edema.  Left elbow bandaged in ACE wrap. Moves well. NVI  Neurological: He is alert.  Skin: Skin is warm and dry. He is not diaphoretic.  Psychiatric: His behavior is normal.  Nursing note and vitals reviewed.   ED Course  Procedures (including critical care  time) Labs Review Labs Reviewed - No data to display  Imaging Review Dg Cervical Spine Complete  12/19/2015  CLINICAL DATA:  Fall today with neck pain. Initial encounter. EXAM: CERVICAL SPINE - COMPLETE 4+ VIEW COMPARISON:  09/19/2015 CT FINDINGS: Partially obscured C7 vertebra on the lateral projection. C7 is best seen on thoracic spine radiography. No evidence of cervical spine fracture or traumatic malalignment. C3-4 and C4-5 anterior cervical discectomy with ventral plate and screw fixation. There is solid bony fusion at both levels. No prevertebral thickening. IMPRESSION: 1. Partially obscured C7 vertebra. 2. No evidence of cervical spine injury. 3. C3-4 and C4-5 discectomy with solid bony fusion. Electronically Signed   By: Marnee Spring M.D.   On: 12/19/2015 03:02   Dg Thoracic Spine 2 View  12/19/2015  CLINICAL DATA:  Initial evaluation for acute trauma, fall today. EXAM: THORACIC SPINE 2 VIEWS COMPARISON:  None. FINDINGS: Mild dextroscoliosis, which may be related to patient positioning. Vertebral body heights preserved. No acute fracture or subluxation. Upper thoracic spine not well visualized on images provided. Scatter mild multilevel degenerative endplate spurring present within the mid and lower thoracic spine. Visualized soft tissues within normal limits. IMPRESSION: No radiographic evidence for acute traumatic injury within the thoracic spine. Electronically Signed   By: Rise Mu M.D.   On: 12/19/2015 03:04   Dg Lumbar Spine Complete  12/19/2015  CLINICAL DATA:  Initial evaluation for acute trauma, fall. EXAM: LUMBAR SPINE - COMPLETE 4+ VIEW COMPARISON:  Prior study from 12/16/2012. FINDINGS: Five non rib-bearing lumbar type vertebral bodies are present. Vertebral bodies normally aligned with preservation of the normal lumbar lordosis. No listhesis or subluxation. Vertebral body heights maintained. No fracture. Visualized sacrum intact. SI joints approximated. No soft tissue  abnormality. IMPRESSION: No radiographic evidence for acute traumatic injury within the lumbar spine. Electronically Signed   By: Rise Mu M.D.   On: 12/19/2015 03:06   Dg Elbow Complete Left  12/19/2015  CLINICAL DATA:  Fall. Left elbow injury and pain. Initial encounter. EXAM: LEFT ELBOW - COMPLETE 3+ VIEW COMPARISON:  08/23/2015 FINDINGS: There is no evidence of fracture, dislocation, or joint effusion. Mild osteoarthritis is again noted. Small bone spur from olecranon process again noted. IMPRESSION: No acute findings.  Stable mild degenerative changes. Electronically Signed   By: Myles Rosenthal M.D.   On: 12/19/2015 16:05   Dg Femur Min 2 Views Left  12/19/2015  CLINICAL DATA:  Initial evaluation for acute trauma, fall. EXAM: LEFT FEMUR 2 VIEWS COMPARISON:  None available. FINDINGS: No acute fracture or dislocation. Limited views of the left hip in the demonstrate no acute abnormality. Degenerative osteoarthritic changes about the hip and knee. No acute soft tissue abnormality. Tubular re- radiopaque density overlying the left groin likely lies external to the patient. Limited views of pelvis demonstrate no acute abnormality. IMPRESSION: 1. No acute fracture or dislocation. 2. 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 Electronically Signed   By: Rise Mu M.D.   On: 12/19/2015 03:09   I have personally reviewed and evaluated these images and lab results as part of my medical decision-making.   EKG Interpretation None      MDM   Final diagnoses:  Normal physical exam  Fall, initial encounter    Patient with c/o fall. Fell earlier with negative imaging.  No concern for new injury. The patient appears reasonably screened and/or stabilized for discharge and I doubt any other medical condition or other [facility] requiring further screening, evaluation, or treatment in the ED at this time prior to discharge. Discussed with Dr. Bebe Shaggy Who agrees with  poc.     Arthor Captain, PA-C 12/20/15 1610  Zadie Rhine, MD 12/20/15 [phone]

## 2015-12-20 NOTE — ED Notes (Signed)
Pt fell in the parking lot after being discharged from the ED. He fell on his left arm and is currently complaining of pain there. No injury to head, no LOC

## 2015-12-20 NOTE — ED Provider Notes (Signed)
CSN: [id number]     Arrival date & time 12/19/15  2314 History  By signing my name below, I, Phillis HaggisGabriella Gaje, attest that this documentation has been prepared under the direction and in the presence of Zadie Rhineonald Perris Tripathi, MD. Electronically Signed: Phillis HaggisGabriella Gaje, ED Scribe. 12/20/2015. 3:48 AM.   Chief Complaint  Patient presents with  . Leg Pain   Patient is a 55 y.o. male presenting with leg pain. The history is provided by the patient. No language interpreter was used.  Leg Pain Location:  Leg Time since incident:  6 hours Injury: yes   Mechanism of injury: fall   Fall:    Fall occurred:  Standing   Impact surface:  Concrete Leg location:  L leg Pain details:    Quality:  Aching   Radiates to:  Does not radiate   Severity:  Mild   Onset quality:  Gradual   Timing:  Constant Chronicity:  Chronic Foreign body present:  No foreign bodies Ineffective treatments:  None tried  HPI Comments: Kendell Banendre Rauen is a 55 y.o. Male who presents to the Emergency Department complaining of worsening of his chronic leg pain onset 6 hours ago. Pt reports that he has been walking with a faulty cane when he tripped and fell. He reports associated left arm pain and states "I hit my head and I passed out for like 10 minutes. They said my eyes rolled into the back of my head." Pt was seen last night and earlier today in the ED for similar symptoms. Pt is requesting food and a bandage wrapping for his left arm. He denies numbness or weakness.   Past Medical History  Diagnosis Date  . Lumbar spinal cord injury (HCC) 2012  . Bipolar disorder (HCC)   . Depression   . Hypertension   . Diabetes mellitus without complication [facility](HCC)    Past Surgical History  Procedure Laterality Date  . Back surgery    . Eye surgery     No family history on file. Social History  Substance Use Topics  . Smoking status: Current Every Day Smoker -- 0.50 packs/day for 10 years    Types: Cigarettes  . Smokeless tobacco: None   . Alcohol Use: Yes     Comment: "Occasional"     Review of Systems  Musculoskeletal: Positive for arthralgias.  Neurological: Negative for weakness and numbness.  All other systems reviewed and are negative.   Allergies  Haldol  Home Medications   Prior to Admission medications   Medication Sig Start Date End Date Taking? Authorizing Provider  cyclobenzaprine (FLEXERIL) 10 MG tablet Take 1 tablet (10 mg total) by mouth 2 (two) times daily as needed for muscle spasms. 07/29/15   Elpidio AnisShari Upstill, PA-C  ibuprofen (ADVIL,MOTRIN) 800 MG tablet Take 1 tablet (800 mg total) by mouth 3 (three) times daily. Patient taking differently: Take 800 mg by mouth every 6 (six) hours as needed for moderate pain.  08/14/15   Dione Boozeavid Glick, MD  lisinopril (PRINIVIL,ZESTRIL) 20 MG tablet Take 1 tablet (20 mg total) by mouth daily. 09/08/15   Shawn C Joy, PA-C  meloxicam (MOBIC) 7.5 MG tablet Take 2 tablets (15 mg total) by mouth daily. 12/19/15   Jerre SimonJessica L Focht, PA  metFORMIN (GLUCOPHAGE) 500 MG tablet Take 1 tablet (500 mg total) by mouth 2 (two) times daily with a meal. 12/19/15   Jerre SimonJessica L Focht, PA  risperiDONE (RISPERIDONE M-TAB) 1 MG disintegrating tablet Take 1 tablet (1 mg total) by mouth 2 (  two) times daily. 09/08/15   Shawn C Joy, PA-C  silver sulfADIAZINE (SILVADENE) 1 % cream Apply 1 application topically daily. 08/14/15   Dione Booze, MD  tamsulosin (FLOMAX) 0.4 MG CAPS capsule Take 1 capsule (0.4 mg total) by mouth daily. 09/08/15   Shawn C Joy, PA-C  traZODone (DESYREL) 50 MG tablet Take 1 tablet (50 mg total) by mouth at bedtime. 07/28/15   Earney Navy, NP   BP 120/87 mmHg  Pulse 100  Temp(Src) 99 F (37.2 C) (Oral)  Resp 18  Ht  (1.727 m)  Wt 177 lb (80.287 kg)  BMI 26.92 kg/m2 Physical Exam CONSTITUTIONAL: disheveled HEAD: Normocephalic/atraumatic EYES: EOMI ENMT: Mucous membranes moist; poor dentition NECK: supple no meningeal signs SPINE/BACK:entire spine nontender CV: S1/S2  noted, no murmurs/rubs/gallops noted LUNGS: Lungs are clear to auscultation bilaterally, no apparent distress ABDOMEN: soft, nontender NEURO: Pt is awake/alert/appropriate, moves all extremitiesx4.  No facial droop. Pt able to ambulate with his cane EXTREMITIES: pulses normal/equal, full ROM; mild tenderness to left forearm with no deformities noted, mild edema noted, pt is able to supinate and pronate with forearm without difficulty, All other extremities/joints palpated/ranged and nontender SKIN: warm, color normal PSYCH: no abnormalities of mood noted, alert and oriented to situation  ED Course  Procedures  DIAGNOSTIC STUDIES: Oxygen Saturation is 99% on RA, normal by my interpretation.    COORDINATION OF CARE: 3:44 AM-Discussed treatment plan which includes ACE wrap with pt at bedside and pt agreed to plan.   Pt well appearing No signs of head trauma He can ambulate with cane No new weakness noted I reviewed imaging from last ED visit (had left elbow xray) and it was negative and no new signs of trauma Stable for discharge  MDM   Final diagnoses:  Fall, initial encounter  Left forearm pain  Chronic pain    Nursing notes including past medical history and social history reviewed and considered in documentation Previous records reviewed and considered   I personally performed the services described in this documentation, which was scribed in my presence. The recorded information has been reviewed and is accurate.       Zadie Rhine, MD 12/20/15 [phone]

## 2015-12-21 ENCOUNTER — Emergency Department (HOSPITAL_COMMUNITY)
Admission: EM | Admit: 2015-12-21 | Discharge: 2015-12-21 | Disposition: A | Discharge status: Home / Self Care | Payer: Medicaid Other | Attending: Emergency Medicine | Admitting: Emergency Medicine

## 2015-12-21 ENCOUNTER — Encounter (HOSPITAL_COMMUNITY): Payer: Self-pay | Admitting: Emergency Medicine

## 2015-12-21 DIAGNOSIS — F17210 Nicotine dependence, cigarettes, uncomplicated: Secondary | ICD-10-CM | POA: Diagnosis not present

## 2015-12-21 DIAGNOSIS — E119 Type 2 diabetes mellitus without complications: Secondary | ICD-10-CM | POA: Insufficient documentation

## 2015-12-21 DIAGNOSIS — F319 Bipolar disorder, unspecified: Secondary | ICD-10-CM | POA: Diagnosis not present

## 2015-12-21 DIAGNOSIS — I10 Essential (primary) hypertension: Secondary | ICD-10-CM | POA: Insufficient documentation

## 2015-12-21 DIAGNOSIS — Z79899 Other long term (current) drug therapy: Secondary | ICD-10-CM | POA: Insufficient documentation

## 2015-12-21 DIAGNOSIS — M545 Low back pain, unspecified: Secondary | ICD-10-CM

## 2015-12-21 DIAGNOSIS — Z7984 Long term (current) use of oral hypoglycemic drugs: Secondary | ICD-10-CM | POA: Insufficient documentation

## 2015-12-21 MED ORDER — IBUPROFEN 800 MG PO TABS
800.0000 mg | ORAL_TABLET | Freq: Once | ORAL | Status: AC
  Administered 2015-12-21: 800 mg via ORAL
  Filled 2015-12-21: qty 1

## 2015-12-21 NOTE — ED Notes (Signed)
Per EMS: Pt c/o back pain because he slept outside last night.  Pt states that it was hurting before then but he couldn't get pain medication for it.  Denies trauma, pain worse on movement.

## 2015-12-21 NOTE — Progress Notes (Signed)
Pt with [facility] 37 ED visits and no admissions Last ED visit x 2 on 12/20/15 and 12/19/15 x 2 ED visits Pt with listed pcp of Charles Leon and med[place]) Pt homeless on 12/21/15 c/o back pain from sleeping outside  There is not a [facility] medication assistance program for pt with medicaid coverage, with discounted cost of medications from $3 or less or waiver at his local pharmacy

## 2015-12-21 NOTE — ED Provider Notes (Signed)
CSN: [id number]     Arrival date & time 12/21/15  1344 History  By signing my name below, I, Charles Leon, attest that this documentation has been prepared under the direction and in the presence of Burna Forts, PA-C Electronically Signed: Soijett Leon, ED Scribe. 12/21/2015. 3:51 PM.   Chief Complaint  Patient presents with  . Back Pain     The history is provided by the patient. No language interpreter was used.    HPI Comments: Charles Leon is a 55 y.o. male with a medical hx of lumbar spinal cord injury, chronic back pain, DM, HTN, who presents to the Emergency Department via EMS complaining of chronic mid back pain onset today. Pt notes that he has not been able to refill his medications due to not being able to get to the pharmacy. Pt states that he would like his ACT team at [facility] consulted for a bed that is available for him. Pt is unsure of the last time that he saw a Child psychotherapist. Pt reports that he stayed out in the rain last night and he is trying to make sure that he is in a shelter tonight. Pt notes that he has insurance. He states that he has not tried any medications for the relief for his symptoms. Pt denies any other symptoms.    Past Medical History  Diagnosis Date  . Lumbar spinal cord injury (HCC) 2012  . Bipolar disorder (HCC)   . Depression   . Hypertension   . Diabetes mellitus without complication [facility])    Past Surgical History  Procedure Laterality Date  . Back surgery    . Eye surgery     No family history on file. Social History  Substance Use Topics  . Smoking status: Current Every Day Smoker -- 0.50 packs/day for 10 years    Types: Cigarettes  . Smokeless tobacco: None  . Alcohol Use: Yes     Comment: "Occasional"     Review of Systems  Constitutional: Negative for fever and chills.  Musculoskeletal: Positive for back pain.  Skin: Negative for color change, rash and wound.      Allergies  Haldol  Home Medications   Prior to  Admission medications   Medication Sig Start Date End Date Taking? Authorizing Provider  lisinopril (ZESTRIL) 10 MG tablet Take 1 tablet (10 mg total) by mouth daily. 12/20/15  Yes Zadie Rhine, MD  meloxicam (MOBIC) 7.5 MG tablet Take 2 tablets (15 mg total) by mouth daily. 12/19/15  Yes Jerre Simon, PA  metFORMIN (GLUCOPHAGE) 500 MG tablet Take 1 tablet (500 mg total) by mouth 2 (two) times daily with a meal. 12/20/15  Yes Zadie Rhine, MD  OLANZapine (ZYPREXA) 20 MG tablet Take 20 mg by mouth at bedtime.  12/17/15 01/16/16 Yes Historical Provider, MD  cyclobenzaprine (FLEXERIL) 10 MG tablet Take 1 tablet (10 mg total) by mouth 2 (two) times daily as needed for muscle spasms. Patient not taking: Reported on 12/21/2015 07/29/15   Elpidio Anis, PA-C  ibuprofen (ADVIL,MOTRIN) 800 MG tablet Take 1 tablet (800 mg total) by mouth 3 (three) times daily. Patient taking differently: Take 800 mg by mouth every 6 (six) hours as needed for moderate pain.  08/14/15   Dione Booze, MD  risperiDONE (RISPERIDONE M-TAB) 1 MG disintegrating tablet Take 1 tablet (1 mg total) by mouth 2 (two) times daily. Patient not taking: Reported on 12/21/2015 09/08/15   Hillard Danker Joy, PA-C  silver sulfADIAZINE (SILVADENE) 1 %  cream Apply 1 application topically daily. Patient not taking: Reported on 12/21/2015 08/14/15   Dione Boozeavid Glick, MD  tamsulosin (FLOMAX) 0.4 MG CAPS capsule Take 1 capsule (0.4 mg total) by mouth daily. Patient not taking: Reported on 12/21/2015 09/08/15   Hillard DankerShawn C Joy, PA-C  traZODone (DESYREL) 50 MG tablet Take 1 tablet (50 mg total) by mouth at bedtime. Patient not taking: Reported on 12/21/2015 07/28/15   Earney NavyJosephine C Onuoha, NP   BP 96/44 mmHg  Pulse 98  Temp(Src) 98 F (36.7 C) (Oral)  Resp 16  SpO2 100% Physical Exam  Constitutional: He is oriented to person, place, and time. He appears well-developed and well-nourished. No distress.  HENT:  Head: Normocephalic.  Neck: Normal range of motion. Neck supple.   Pulmonary/Chest: Effort normal.  Musculoskeletal: Normal range of motion. He exhibits tenderness. He exhibits no edema.  Tender diffusely to lumbar region.   Neurological: He is alert and oriented to person, place, and time.  Skin: Skin is warm and dry. He is not diaphoretic.  Psychiatric: He has a normal mood and affect. His behavior is normal. Judgment and thought content normal.  Nursing note and vitals reviewed.   ED Course  Procedures (including critical care time) DIAGNOSTIC STUDIES: Oxygen Saturation is 100% on RA, nl by my interpretation.    COORDINATION OF CARE: 3:49 PM Discussed treatment plan with pt at bedside and pt agreed to plan.    Labs Review Labs Reviewed - No data to display  Imaging Review No results found. I have personally reviewed and evaluated these images as part of my medical decision-making.   EKG Interpretation None      MDM   Final diagnoses:  Bilateral low back pain without sciatica    Labs:   Imaging:   Consults:   Therapeutics:   Discharge Meds:   Assessment/Plan: Pt is here with chronic back pain, this is his 38th visit in 6 months. Pt encouraged to follow up with primary care. Given strict return precautions in the event new or worsening symptoms present. Pt verbalized understanding and agreement to today's plan and had no further questions or concerns at this time.   I personally performed the services described in this documentation, which was scribed in my presence. The recorded information has been reviewed and is accurate.  Eyvonne MechanicJeffrey Shalin Vonbargen, PA-C 12/21/15 1726  Arby BarretteMarcy Pfeiffer, MD 12/21/15 [phone]

## 2015-12-21 NOTE — ED Notes (Signed)
Pt escorted to room via wheelchair. Ptv has difficulty transferring to wheelchair

## 2015-12-21 NOTE — ED Notes (Signed)
Bus pass given Slippers given Ace bandage given

## 2015-12-21 NOTE — Discharge Instructions (Signed)

## 2015-12-22 ENCOUNTER — Emergency Department (HOSPITAL_COMMUNITY)
Admission: EM | Admit: 2015-12-22 | Discharge: 2015-12-23 | Disposition: A | Discharge status: Home / Self Care | Payer: Medicaid Other | Attending: Emergency Medicine | Admitting: Emergency Medicine

## 2015-12-22 ENCOUNTER — Encounter (HOSPITAL_COMMUNITY): Payer: Self-pay | Admitting: Emergency Medicine

## 2015-12-22 DIAGNOSIS — F319 Bipolar disorder, unspecified: Secondary | ICD-10-CM | POA: Insufficient documentation

## 2015-12-22 DIAGNOSIS — Z79899 Other long term (current) drug therapy: Secondary | ICD-10-CM | POA: Insufficient documentation

## 2015-12-22 DIAGNOSIS — F17210 Nicotine dependence, cigarettes, uncomplicated: Secondary | ICD-10-CM | POA: Insufficient documentation

## 2015-12-22 DIAGNOSIS — I10 Essential (primary) hypertension: Secondary | ICD-10-CM | POA: Insufficient documentation

## 2015-12-22 DIAGNOSIS — E119 Type 2 diabetes mellitus without complications: Secondary | ICD-10-CM | POA: Insufficient documentation

## 2015-12-22 DIAGNOSIS — F4325 Adjustment disorder with mixed disturbance of emotions and conduct: Secondary | ICD-10-CM | POA: Diagnosis not present

## 2015-12-22 DIAGNOSIS — R45851 Suicidal ideations: Secondary | ICD-10-CM | POA: Diagnosis present

## 2015-12-22 DIAGNOSIS — Z7984 Long term (current) use of oral hypoglycemic drugs: Secondary | ICD-10-CM | POA: Diagnosis not present

## 2015-12-22 LAB — CBC
HCT: 38 % — ABNORMAL LOW (ref 39.0–52.0)
Hemoglobin: 13 g/dL (ref 13.0–17.0)
MCH: 27.7 pg (ref 26.0–34.0)
MCHC: 34.2 g/dL (ref 30.0–36.0)
MCV: 80.9 fL (ref 78.0–100.0)
PLATELETS: 338 10*3/uL (ref 150–400)
RBC: 4.7 MIL/uL (ref 4.22–5.81)
RDW: 15.2 % (ref 11.5–15.5)
WBC: 10.9 10*3/uL — ABNORMAL HIGH (ref 4.0–10.5)

## 2015-12-22 LAB — COMPREHENSIVE METABOLIC PANEL
ALT: 25 U/L (ref 17–63)
AST: 35 U/L (ref 15–41)
Albumin: 3.8 g/dL (ref 3.5–5.0)
Alkaline Phosphatase: 91 U/L (ref 38–126)
Anion gap: 6 (ref 5–15)
BUN: 17 mg/dL (ref 6–20)
CHLORIDE: 109 mmol/L (ref 101–111)
CO2: 21 mmol/L — AB (ref 22–32)
CREATININE: 1.09 mg/dL (ref 0.61–1.24)
Calcium: 8.8 mg/dL — ABNORMAL LOW (ref 8.9–10.3)
Glucose, Bld: 170 mg/dL — ABNORMAL HIGH (ref 65–99)
POTASSIUM: 4.2 mmol/L (ref 3.5–5.1)
SODIUM: 136 mmol/L (ref 135–145)
Total Bilirubin: 1.1 mg/dL (ref 0.3–1.2)
Total Protein: 7.3 g/dL (ref 6.5–8.1)

## 2015-12-22 LAB — SALICYLATE LEVEL

## 2015-12-22 LAB — ACETAMINOPHEN LEVEL: Acetaminophen (Tylenol), Serum: <10 ug/mL — ABNORMAL LOW (ref 10–30)

## 2015-12-22 LAB — ETHANOL

## 2015-12-22 MED ORDER — RISPERIDONE 1 MG PO TBDP
1.0000 mg | ORAL_TABLET | Freq: Two times a day (BID) | ORAL | Status: DC
  Filled 2015-12-22: qty 1

## 2015-12-22 MED ORDER — LISINOPRIL 10 MG PO TABS
10.0000 mg | ORAL_TABLET | Freq: Every day | ORAL | Status: DC
  Administered 2015-12-23: 10 mg via ORAL
  Filled 2015-12-22: qty 1

## 2015-12-22 MED ORDER — OLANZAPINE 10 MG PO TABS
20.0000 mg | ORAL_TABLET | Freq: Every day | ORAL | Status: DC
  Administered 2015-12-22: 20 mg via ORAL
  Filled 2015-12-22: qty 2

## 2015-12-22 MED ORDER — METFORMIN HCL 500 MG PO TABS
500.0000 mg | ORAL_TABLET | Freq: Two times a day (BID) | ORAL | Status: DC
  Administered 2015-12-23: 500 mg via ORAL
  Filled 2015-12-22 (×3): qty 1

## 2015-12-22 MED ORDER — TRAZODONE HCL 50 MG PO TABS
50.0000 mg | ORAL_TABLET | Freq: Every day | ORAL | Status: DC
  Filled 2015-12-22: qty 1

## 2015-12-22 MED ORDER — ZIPRASIDONE MESYLATE 20 MG IM SOLR
20.0000 mg | Freq: Once | INTRAMUSCULAR | Status: AC
  Administered 2015-12-22: 20 mg via INTRAMUSCULAR
  Filled 2015-12-22: qty 20

## 2015-12-22 MED ORDER — TAMSULOSIN HCL 0.4 MG PO CAPS
0.4000 mg | ORAL_CAPSULE | Freq: Every day | ORAL | Status: DC
  Administered 2015-12-23: 0.4 mg via ORAL
  Filled 2015-12-22 (×2): qty 1

## 2015-12-22 MED ORDER — STERILE WATER FOR INJECTION IJ SOLN
INTRAMUSCULAR | Status: AC
  Administered 2015-12-22: 22:00:00
  Filled 2015-12-22: qty 10

## 2015-12-22 MED ORDER — ACETAMINOPHEN 325 MG PO TABS
650.0000 mg | ORAL_TABLET | Freq: Once | ORAL | Status: AC
  Administered 2015-12-22: 650 mg via ORAL
  Filled 2015-12-22: qty 2

## 2015-12-22 NOTE — ED Notes (Addendum)
[facility] [employer] [facility]: "respondant endorses SI with plan. He is evasive regarding plan, "I'll just do it".  Patient is mentally ill. He is a danger to himself and requires inpatient psych hosp for safety."  When patient asked why he is here today, responds, "It's hot outside today".  Patient states has plan of jumping of bridge to harm himself.   Patient states, '[facility]?"  Patient [place] as to [place] he is trying to get there.

## 2015-12-23 DIAGNOSIS — F4325 Adjustment disorder with mixed disturbance of emotions and conduct: Secondary | ICD-10-CM

## 2015-12-23 NOTE — ED Provider Notes (Signed)
CSN: [id number]     Arrival date & time 12/22/15  1740 History   First MD Initiated Contact with Patient 12/22/15 1806     Chief Complaint  Patient presents with  . IVC    . Suicidal      HPI  [facility] Presented there today claiming he was suicidal and planned to jump from a bridge. Placed under IVC and transferred here.  Past Medical History  Diagnosis Date  . Lumbar spinal cord injury (HCC) 2012  . Bipolar disorder (HCC)   . Depression   . Hypertension   . Diabetes mellitus without complication [facility](HCC)    Past Surgical History  Procedure Laterality Date  . Back surgery    . Eye surgery     No family history on file. Social History  Substance Use Topics  . Smoking status: Current Every Day Smoker -- 0.50 packs/day for 10 years    Types: Cigarettes  . Smokeless tobacco: None  . Alcohol Use: Yes     Comment: "Occasional"     Review of Systems  Constitutional: Negative for fever, chills, diaphoresis, appetite change and fatigue.  HENT: Negative for mouth sores, sore throat and trouble swallowing.   Eyes: Negative for visual disturbance.  Respiratory: Negative for cough, chest tightness, shortness of breath and wheezing.   Cardiovascular: Negative for chest pain.  Gastrointestinal: Negative for nausea, vomiting, abdominal pain, diarrhea and abdominal distention.  Endocrine: Negative for polydipsia, polyphagia and polyuria.  Genitourinary: Negative for dysuria, frequency and hematuria.  Musculoskeletal: Negative for gait problem.  Skin: Negative for color change, pallor and rash.  Neurological: Negative for dizziness, syncope, light-headedness and headaches.  Hematological: Does not bruise/bleed easily.  Psychiatric/Behavioral: Positive for suicidal ideas. Negative for behavioral problems and confusion.      Allergies  Haldol  Home Medications   Prior to Admission medications   Medication Sig Start Date End Date Taking? Authorizing  Provider  lisinopril (ZESTRIL) 10 MG tablet Take 1 tablet (10 mg total) by mouth daily. 12/20/15  Yes Zadie Rhineonald Wickline, MD  meloxicam (MOBIC) 7.5 MG tablet Take 2 tablets (15 mg total) by mouth daily. 12/19/15  Yes Jerre SimonJessica L Focht, PA  metFORMIN (GLUCOPHAGE) 500 MG tablet Take 1 tablet (500 mg total) by mouth 2 (two) times daily with a meal. 12/20/15  Yes Zadie Rhineonald Wickline, MD  cyclobenzaprine (FLEXERIL) 10 MG tablet Take 1 tablet (10 mg total) by mouth 2 (two) times daily as needed for muscle spasms. Patient not taking: Reported on 12/21/2015 07/29/15   Elpidio AnisShari Upstill, PA-C  ibuprofen (ADVIL,MOTRIN) 800 MG tablet Take 1 tablet (800 mg total) by mouth 3 (three) times daily. Patient taking differently: Take 800 mg by mouth every 6 (six) hours as needed for moderate pain.  08/14/15   Dione Boozeavid Glick, MD  OLANZapine (ZYPREXA) 20 MG tablet Take 20 mg by mouth at bedtime.  12/17/15 01/16/16  Historical Provider, MD  risperiDONE (RISPERIDONE M-TAB) 1 MG disintegrating tablet Take 1 tablet (1 mg total) by mouth 2 (two) times daily. Patient not taking: Reported on 12/21/2015 09/08/15   Hillard DankerShawn C Joy, PA-C  silver sulfADIAZINE (SILVADENE) 1 % cream Apply 1 application topically daily. Patient not taking: Reported on 12/21/2015 08/14/15   Dione Boozeavid Glick, MD  tamsulosin (FLOMAX) 0.4 MG CAPS capsule Take 1 capsule (0.4 mg total) by mouth daily. Patient not taking: Reported on 12/21/2015 09/08/15   Hillard DankerShawn C Joy, PA-C  traZODone (DESYREL) 50 MG tablet Take 1 tablet (50 mg total)  by mouth at bedtime. Patient not taking: Reported on 12/21/2015 07/28/15   Earney Navy, NP   BP 97/61 mmHg  Pulse 102  Temp(Src) 98.2 F (36.8 C) (Oral)  Resp 16  SpO2 98% Physical Exam  Constitutional: He is oriented to person, place, and time. He appears well-developed and well-nourished. No distress.  HENT:  Head: Normocephalic.  Eyes: Conjunctivae are normal. Pupils are equal, round, and reactive to light. No scleral icterus.  Neck: Normal range of  motion. Neck supple. No thyromegaly present.  Cardiovascular: Normal rate and regular rhythm.  Exam reveals no gallop and no friction rub.   No murmur heard. Pulmonary/Chest: Effort normal and breath sounds normal. No respiratory distress. He has no wheezes. He has no rales.  Abdominal: Soft. Bowel sounds are normal. He exhibits no distension. There is no tenderness. There is no rebound.  Musculoskeletal: Normal range of motion.  Neurological: He is alert and oriented to person, place, and time.  Skin: Skin is warm and dry. No rash noted.  Psychiatric: His behavior is normal. His mood appears anxious. His speech is delayed.    ED Course  Procedures (including critical care time) Labs Review Labs Reviewed  COMPREHENSIVE METABOLIC PANEL - Abnormal; Notable for the following:    CO2 21 (*)    Glucose, Bld 170 (*)    Calcium 8.8 (*)    All other components within normal limits  ACETAMINOPHEN LEVEL - Abnormal; Notable for the following:    Acetaminophen (Tylenol), Serum <10 (*)    All other components within normal limits  CBC - Abnormal; Notable for the following:    WBC 10.9 (*)    HCT 38.0 (*)    All other components within normal limits  ETHANOL  SALICYLATE LEVEL  URINE RAPID DRUG SCREEN, HOSP PERFORMED    Imaging Review No results found. I have personally reviewed and evaluated these images and lab results as part of my medical decision-making.   EKG Interpretation None      MDM   Final diagnoses:  Suicidal ideation    Patient presents for evaluation after referral from [facility] under IVC.  Has a plan to hurt himself by jumping from a bridge. He reiterates to me that his plan is still to kill himself "if I don't stay here tonight". Is medically clear. Await psychiatric intervention and recommendations    Rolland Porter, MD 12/23/15 [phone]

## 2015-12-23 NOTE — Consult Note (Signed)
[place] Psychiatry Consult   Reason for Consult:  Emotional disturbance Referring Physician:  EDP Patient Identification: Charles Leon MRN:  [id number] Principal Diagnosis: Adjustment disorder with mixed disturbance of emotions and conduct Diagnosis:   Patient Active Problem List   Diagnosis Date Noted  . Adjustment disorder with mixed disturbance of emotions and conduct [F43.25] 12/23/2015    Priority: High  . Bipolar 1 disorder, depressed ([place]) [F31.9] 06/06/2014    Priority: High    Total Time spent with patient: 45 minutes  Subjective:   Charles Leon is a 55 y.o. male patient does not warrant admission.  HPI:  55 yo male who presented to the ED after getting upset at the [place] after being "put out."  He denies suicidal/homicidal ideations, hallucinations, and alcohol/drug abuse.  He reports only wanting a bus pass to [place] [employer] to go live with his brother.  Stable for discharge.  Past Psychiatric History: bipolar disorder  Risk to Self: Is patient at risk for suicide?: Yes Risk to Others:  None Prior Inpatient Therapy:  yes Prior Outpatient Therapy:  yes  Past Medical History:  Past Medical History  Diagnosis Date  . Lumbar spinal cord injury ([place]) 2012  . Bipolar disorder ([place])   . Depression   . Hypertension   . Diabetes mellitus without complication [facility])     Past Surgical History  Procedure Laterality Date  . Back surgery    . Eye surgery     Family History: No family history on file. Family Psychiatric  History: unknown Social History:  History  Alcohol Use  . 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 Marital Status: Single    Spouse Name: N/A  . Number of Children: N/A  . Years of Education: N/A   Social History Main Topics  . Smoking status: Current Every Day Smoker -- 0.50 packs/day for 10 years    Types: Cigarettes  .  Smokeless tobacco: None  . Alcohol Use: Yes     Comment: "Occasional"   . Drug Use: No     Comment: Hx of crack/cocaine, THC denies   . Sexual Activity: Not Asked   Other Topics Concern  . None   Social History Narrative   Additional Social History:    Allergies:   Allergies  Allergen Reactions  . Haldol [Haloperidol] Other (See Comments)    Stiff neck    Labs:  Results for orders placed or performed during the hospital encounter of 12/22/15 (from the past 48 hour(s))  Comprehensive metabolic panel     Status: Abnormal   Collection Time: 12/22/15  6:11 PM  Result Value Ref Range   Sodium 136 135 - 145 mmol/L   Potassium 4.2 3.5 - 5.1 mmol/L   Chloride 109 101 - 111 mmol/L   CO2 21 (L) 22 - 32 mmol/L   Glucose, Bld 170 (H) 65 - 99 mg/dL   BUN 17 6 - 20 mg/dL   Creatinine, Ser 1.09 0.61 - 1.24 mg/dL   Calcium 8.8 (L) 8.9 - 10.3 mg/dL   Total Protein 7.3 6.5 - 8.1 g/dL   Albumin 3.8 3.5 - 5.0 g/dL   AST 35 15 - 41 U/L   ALT 25 17 - 63 U/L   Alkaline Phosphatase 91 38 - 126 U/L   Total Bilirubin 1.1 0.3 -  1.2 mg/dL   GFR calc non Af Amer >60 >60 mL/min   GFR calc Af Amer >60 >60 mL/min    Comment: (NOTE) The eGFR has been calculated using the CKD EPI equation. This calculation has not been validated in all clinical situations. eGFR's persistently <60 mL/min signify possible Chronic Kidney Disease.    Anion gap 6 5 - 15  cbc     Status: Abnormal   Collection Time: 12/22/15  6:11 PM  Result Value Ref Range   WBC 10.9 (H) 4.0 - 10.5 K/uL   RBC 4.70 4.22 - 5.81 MIL/uL   Hemoglobin 13.0 13.0 - 17.0 g/dL   HCT 38.0 (L) 39.0 - 52.0 %   MCV 80.9 78.0 - 100.0 fL   MCH 27.7 26.0 - 34.0 pg   MCHC 34.2 30.0 - 36.0 g/dL   RDW 15.2 11.5 - 15.5 %   Platelets 338 150 - 400 K/uL  Ethanol     Status: None   Collection Time: 12/22/15  6:12 PM  Result Value Ref Range   Alcohol, Ethyl (B) <5 <5 mg/dL    Comment:        LOWEST DETECTABLE LIMIT FOR SERUM ALCOHOL IS 5  mg/dL FOR MEDICAL PURPOSES ONLY   Salicylate level     Status: None   Collection Time: 12/22/15  6:12 PM  Result Value Ref Range   Salicylate Lvl <4.4 2.8 - 30.0 mg/dL  Acetaminophen level     Status: Abnormal   Collection Time: 12/22/15  6:12 PM  Result Value Ref Range   Acetaminophen (Tylenol), Serum <10 (L) 10 - 30 ug/mL    Comment:        THERAPEUTIC CONCENTRATIONS VARY SIGNIFICANTLY. A RANGE OF 10-30 ug/mL MAY BE AN EFFECTIVE CONCENTRATION FOR MANY PATIENTS. HOWEVER, SOME ARE BEST TREATED AT CONCENTRATIONS OUTSIDE THIS RANGE. ACETAMINOPHEN CONCENTRATIONS >150 ug/mL AT 4 HOURS AFTER INGESTION AND >50 ug/mL AT 12 HOURS AFTER INGESTION ARE OFTEN ASSOCIATED WITH TOXIC REACTIONS.     Current Facility-Administered Medications  Medication Dose Route Frequency Provider Last Rate Last Dose  . lisinopril (PRINIVIL,ZESTRIL) tablet 10 mg  10 mg Oral Daily Tanna Furry, MD   10 mg at 12/23/15 1024  . metFORMIN (GLUCOPHAGE) tablet 500 mg  500 mg Oral BID WC Tanna Furry, MD   500 mg at 12/23/15 0850  . OLANZapine (ZYPREXA) tablet 20 mg  20 mg Oral QHS Tanna Furry, MD   20 mg at 12/22/15 2134  . tamsulosin (FLOMAX) capsule 0.4 mg  0.4 mg Oral Daily Tanna Furry, MD   0.4 mg at 12/23/15 1024  . traZODone (DESYREL) tablet 50 mg  50 mg Oral QHS Tanna Furry, MD   50 mg at 12/22/15 2139   Current Outpatient Prescriptions  Medication Sig Dispense Refill  . lisinopril (ZESTRIL) 10 MG tablet Take 1 tablet (10 mg total) by mouth daily. 14 tablet 0  . meloxicam (MOBIC) 7.5 MG tablet Take 2 tablets (15 mg total) by mouth daily. 30 tablet 0  . metFORMIN (GLUCOPHAGE) 500 MG tablet Take 1 tablet (500 mg total) by mouth 2 (two) times daily with a meal. 20 tablet 0  . cyclobenzaprine (FLEXERIL) 10 MG tablet Take 1 tablet (10 mg total) by mouth 2 (two) times daily as needed for muscle spasms. (Patient not taking: Reported on 12/21/2015) 20 tablet 0  . ibuprofen (ADVIL,MOTRIN) 800 MG tablet Take 1 tablet (800 mg  total) by mouth 3 (three) times daily. (Patient taking differently: Take 800 mg by  mouth every 6 (six) hours as needed for moderate pain. ) 42 tablet 0  . OLANZapine (ZYPREXA) 20 MG tablet Take 20 mg by mouth at bedtime.     . risperiDONE (RISPERIDONE M-TAB) 1 MG disintegrating tablet Take 1 tablet (1 mg total) by mouth 2 (two) times daily. (Patient not taking: Reported on 12/21/2015) 60 tablet 1  . silver sulfADIAZINE (SILVADENE) 1 % cream Apply 1 application topically daily. (Patient not taking: Reported on 12/21/2015) 50 g 0  . tamsulosin (FLOMAX) 0.4 MG CAPS capsule Take 1 capsule (0.4 mg total) by mouth daily. (Patient not taking: Reported on 12/21/2015) 30 capsule 1  . traZODone (DESYREL) 50 MG tablet Take 1 tablet (50 mg total) by mouth at bedtime. (Patient not taking: Reported on 12/21/2015) 30 tablet 0    Musculoskeletal: Strength & Muscle Tone: within normal limits Gait & Station: normal Patient leans: N/A  Psychiatric Specialty Exam: Physical Exam  Constitutional: He is oriented to person, place, and time. He appears well-developed and well-nourished.  HENT:  Head: Normocephalic.  Neck: Normal range of motion.  Respiratory: Effort normal.  GI: Soft.  Musculoskeletal: Normal range of motion.  Neurological: He is alert and oriented to person, place, and time.  Skin: Skin is warm.  Psychiatric: He has a normal mood and affect. His speech is normal and behavior is normal. Judgment and thought content normal. Cognition and memory are normal.    Review of Systems  Constitutional: Negative.   HENT: Negative.   Eyes: Negative.   Respiratory: Negative.   Cardiovascular: Negative.   Gastrointestinal: Negative.   Genitourinary: Negative.   Musculoskeletal: Negative.   Skin: Negative.   Neurological: Negative.   Endo/Heme/Allergies: Negative.   Psychiatric/Behavioral: Negative.     Blood pressure 126/69, pulse 80, temperature 98.3 F (36.8 C), temperature source Oral, resp. rate 18,  SpO2 100 %.There is no weight on file to calculate BMI.  General Appearance: Casual  Eye Contact:  Good  Speech:  Normal Rate  Volume:  Normal  Mood:  Euthymic  Affect:  Congruent  Thought Process:  Coherent  Orientation:  Full (Time, Place, and Person)  Thought Content:  WDL  Suicidal Thoughts:  No  Homicidal Thoughts:  No  Memory:  Immediate;   Good Recent;   Good Remote;   Good  Judgement:  Fair  Insight:  Fair  Psychomotor Activity:  Normal  Concentration:  Concentration: Good and Attention Span: Good  Recall:  Good  Fund of Knowledge:  Good  Language:  Good  Akathisia:  No  Handed:  Right  AIMS (if indicated):     Assets:  Leisure Time Physical Health Resilience  ADL's:  Intact  Cognition:  WNL  Sleep:        Treatment Plan Summary: Daily contact with patient to assess and evaluate symptoms and progress in treatment, Medication management and Plan adjustment disorder with mixed disturbance of emotion and conduct: -Crisis stabilization -Medication management:  Continued his medical and psychiatric medications:  Zyprexa 20 mg at bedtime for bipolar disorder, Trazodone 50 mg at bedtime for sleep.  Did not continue Risperdal 1 mg BID for psychosis -Individual counseling -Bus pass provided  Disposition: No evidence of imminent risk to self or others at present.    Waylan Boga, NP 12/23/2015 10:27 AM Patient seen face-to-face for psychiatric evaluation, chart reviewed and case discussed with the physician extender and developed treatment plan. Reviewed the information documented and agree with the treatment plan. Corena Pilgrim, MD

## 2015-12-23 NOTE — ED Notes (Signed)
Pt discharged with a bus pass. [facility]. All belongings returned to pt. Denies SI/HI, is not delusional or having AVH.

## 2015-12-23 NOTE — [facility] Suicide Risk Assessment (Signed)
Suicide Risk Assessment  Discharge Assessment   [facility]   Principal Problem: Adjustment disorder with mixed disturbance of emotions and conduct Discharge Diagnoses:  Patient Active Problem List   Diagnosis Date Noted  . Adjustment disorder with mixed disturbance of emotions and conduct [F43.25] 12/23/2015    Priority: High  . Bipolar 1 disorder, depressed (HCC) [F31.9] 06/06/2014    Priority: High    Total Time spent with patient: 45 minutes   Musculoskeletal: Strength & Muscle Tone: within normal limits Gait & Station: normal Patient leans: N/A  Psychiatric Specialty Exam: Physical Exam  Constitutional: He is oriented to person, place, and time. He appears well-developed and well-nourished.  HENT:  Head: Normocephalic.  Neck: Normal range of motion.  Respiratory: Effort normal.  GI: Soft.  Musculoskeletal: Normal range of motion.  Neurological: He is alert and oriented to person, place, and time.  Skin: Skin is warm.  Psychiatric: He has a normal mood and affect. His speech is normal and behavior is normal. Judgment and thought content normal. Cognition and memory are normal.    Review of Systems  Constitutional: Negative.   HENT: Negative.   Eyes: Negative.   Respiratory: Negative.   Cardiovascular: Negative.   Gastrointestinal: Negative.   Genitourinary: Negative.   Musculoskeletal: Negative.   Skin: Negative.   Neurological: Negative.   Endo/Heme/Allergies: Negative.   Psychiatric/Behavioral: Negative.     Blood pressure 126/69, pulse 80, temperature 98.3 F (36.8 C), temperature source Oral, resp. rate 18, SpO2 100 %.There is no weight on file to calculate BMI.  General Appearance: Casual  Eye Contact:  Good  Speech:  Normal Rate  Volume:  Normal  Mood:  Euthymic  Affect:  Congruent  Thought Process:  Coherent  Orientation:  Full (Time, Place, and Person)  Thought Content:  WDL  Suicidal Thoughts:  No  Homicidal  Thoughts:  No  Memory:  Immediate;   Good Recent;   Good Remote;   Good  Judgement:  Fair  Insight:  Fair  Psychomotor Activity:  Normal  Concentration:  Concentration: Good and Attention Span: Good  Recall:  Good  Fund of Knowledge:  Good  Language:  Good  Akathisia:  No  Handed:  Right  AIMS (if indicated):     Assets:  Leisure Time Physical Health Resilience  ADL's:  Intact  Cognition:  WNL  Sleep:      Mental Status Per Nursing Assessment::   On Admission:   suicidal ideations  Demographic Factors:  Male  Loss Factors: NA  Historical Factors: NA  Risk Reduction Factors:   Sense of responsibility to family, Living with another person, especially a relative, Positive social support and Positive therapeutic relationship  Continued Clinical Symptoms:  None  Cognitive Features That Contribute To Risk:  None    Suicide Risk:  Minimal: No identifiable suicidal ideation.  Patients presenting with no risk factors but with morbid ruminations; may be classified as minimal risk based on the severity of the depressive symptoms   Plan Of Care/Follow-up recommendations:  Activity:  as tolerated Diet:  heart healthy diet  LORD, JAMISON, NP 12/23/2015, 11:13 AM

## 2015-12-25 ENCOUNTER — Emergency Department (HOSPITAL_COMMUNITY): Payer: Medicaid Other

## 2015-12-25 ENCOUNTER — Emergency Department (HOSPITAL_COMMUNITY)
Admission: EM | Admit: 2015-12-25 | Discharge: 2015-12-25 | Disposition: A | Discharge status: Home / Self Care | Payer: Medicaid Other | Source: Home / Self Care

## 2015-12-25 ENCOUNTER — Encounter (HOSPITAL_COMMUNITY): Payer: Self-pay

## 2015-12-25 ENCOUNTER — Emergency Department (HOSPITAL_COMMUNITY)
Admission: EM | Admit: 2015-12-25 | Discharge: 2015-12-25 | Disposition: A | Discharge status: Home / Self Care | Payer: Medicaid Other | Attending: Emergency Medicine | Admitting: Emergency Medicine

## 2015-12-25 ENCOUNTER — Encounter (HOSPITAL_COMMUNITY): Payer: Self-pay | Admitting: Emergency Medicine

## 2015-12-25 DIAGNOSIS — I10 Essential (primary) hypertension: Secondary | ICD-10-CM | POA: Insufficient documentation

## 2015-12-25 DIAGNOSIS — M542 Cervicalgia: Secondary | ICD-10-CM | POA: Diagnosis not present

## 2015-12-25 DIAGNOSIS — R51 Headache: Secondary | ICD-10-CM | POA: Insufficient documentation

## 2015-12-25 DIAGNOSIS — Y9389 Activity, other specified: Secondary | ICD-10-CM | POA: Insufficient documentation

## 2015-12-25 DIAGNOSIS — Z79899 Other long term (current) drug therapy: Secondary | ICD-10-CM | POA: Insufficient documentation

## 2015-12-25 DIAGNOSIS — W19XXXA Unspecified fall, initial encounter: Secondary | ICD-10-CM | POA: Insufficient documentation

## 2015-12-25 DIAGNOSIS — Y999 Unspecified external cause status: Secondary | ICD-10-CM | POA: Insufficient documentation

## 2015-12-25 DIAGNOSIS — M545 Low back pain, unspecified: Secondary | ICD-10-CM

## 2015-12-25 DIAGNOSIS — Y939 Activity, unspecified: Secondary | ICD-10-CM | POA: Insufficient documentation

## 2015-12-25 DIAGNOSIS — Y92524 Gas station as the place of occurrence of the external cause: Secondary | ICD-10-CM

## 2015-12-25 DIAGNOSIS — F319 Bipolar disorder, unspecified: Secondary | ICD-10-CM | POA: Diagnosis not present

## 2015-12-25 DIAGNOSIS — E119 Type 2 diabetes mellitus without complications: Secondary | ICD-10-CM | POA: Insufficient documentation

## 2015-12-25 DIAGNOSIS — Z7984 Long term (current) use of oral hypoglycemic drugs: Secondary | ICD-10-CM | POA: Insufficient documentation

## 2015-12-25 DIAGNOSIS — M549 Dorsalgia, unspecified: Secondary | ICD-10-CM | POA: Diagnosis present

## 2015-12-25 DIAGNOSIS — S0990XA Unspecified injury of head, initial encounter: Secondary | ICD-10-CM | POA: Insufficient documentation

## 2015-12-25 DIAGNOSIS — Y998 Other external cause status: Secondary | ICD-10-CM

## 2015-12-25 DIAGNOSIS — W1839XA Other fall on same level, initial encounter: Secondary | ICD-10-CM | POA: Insufficient documentation

## 2015-12-25 DIAGNOSIS — F17210 Nicotine dependence, cigarettes, uncomplicated: Secondary | ICD-10-CM

## 2015-12-25 MED ORDER — OXYCODONE-ACETAMINOPHEN 5-325 MG PO TABS
1.0000 | ORAL_TABLET | Freq: Four times a day (QID) | ORAL | Status: DC | PRN
Start: 2015-12-25 — End: 2017-10-23

## 2015-12-25 NOTE — ED Notes (Addendum)
Pt walking around waiting room with unsteady gate and assistance from a cane. Pt advised to sit down and wait for provider. Pt walking back and forth from snack machine, outside, and to chairs. Yellow fall band placed on pts arm.

## 2015-12-25 NOTE — ED Notes (Signed)
Pt asked for a cab to be called to go home. Pt stats "yea i'll be back tomorrow. I drank a mountain dew, i'm good."

## 2015-12-25 NOTE — ED Notes (Signed)
MD at bedside. 

## 2015-12-25 NOTE — ED Provider Notes (Signed)
CSN: [id number]     Arrival date & time 12/25/15  2038 History   First MD Initiated Contact with Patient 12/25/15 2058     Chief Complaint  Patient presents with  . Fall     (Consider location/radiation/quality/duration/timing/severity/associated sxs/prior Treatment) Patient is a 55 y.o. male presenting with fall. The history is provided by the patient (Patient states that he fell and hit his head no loss of consciousness patient has back and neck pain).  Fall This is a new problem. The current episode started 6 to 12 hours ago. The problem occurs constantly. The problem has not changed since onset.Pertinent negatives include no chest pain, no abdominal pain and no headaches. Nothing aggravates the symptoms. Nothing relieves the symptoms.    Past Medical History  Diagnosis Date  . Lumbar spinal cord injury (HCC) 2012  . Bipolar disorder (HCC)   . Depression   . Hypertension   . Diabetes mellitus without complication [facility] [place])    Past Surgical History  Procedure Laterality Date  . Back surgery    . Eye surgery     History reviewed. No pertinent family history. Social History  Substance Use Topics  . Smoking status: Current Every Day Smoker -- 0.50 packs/day for 10 years    Types: Cigarettes  . Smokeless tobacco: None  . Alcohol Use: Yes     Comment: "Occasional"     Review of Systems  Constitutional: Negative for appetite change and fatigue.  HENT: Negative for congestion, ear discharge and sinus pressure.        Headache  Eyes: Negative for discharge.  Respiratory: Negative for cough.   Cardiovascular: Negative for chest pain.  Gastrointestinal: Negative for abdominal pain and diarrhea.  Genitourinary: Negative for frequency and hematuria.  Musculoskeletal: Positive for back pain.  Skin: Negative for rash.  Neurological: Negative for seizures and headaches.  Psychiatric/Behavioral: Negative for hallucinations.      Allergies  Haldol  Home Medications   Prior to  Admission medications   Medication Sig Start Date End Date Taking? Authorizing Provider  lisinopril (ZESTRIL) 10 MG tablet Take 1 tablet (10 mg total) by mouth daily. 12/20/15  Yes Zadie Rhine, MD  meloxicam (MOBIC) 7.5 MG tablet Take 2 tablets (15 mg total) by mouth daily. 12/19/15  Yes Jerre Simon, PA  metFORMIN (GLUCOPHAGE) 500 MG tablet Take 1 tablet (500 mg total) by mouth 2 (two) times daily with a meal. 12/20/15  Yes Zadie Rhine, MD  OLANZapine (ZYPREXA) 20 MG tablet Take 20 mg by mouth at bedtime.  12/17/15 01/16/16 Yes Historical Provider, MD  cyclobenzaprine (FLEXERIL) 10 MG tablet Take 1 tablet (10 mg total) by mouth 2 (two) times daily as needed for muscle spasms. Patient not taking: Reported on 12/21/2015 07/29/15   Elpidio Anis, PA-C  ibuprofen (ADVIL,MOTRIN) 800 MG tablet Take 1 tablet (800 mg total) by mouth 3 (three) times daily. Patient taking differently: Take 800 mg by mouth every 6 (six) hours as needed for moderate pain.  08/14/15   Dione Booze, MD  oxyCODONE-acetaminophen (PERCOCET) 5-325 MG tablet Take 1 tablet by mouth every 6 (six) hours as needed. 12/25/15   Bethann Berkshire, MD  risperiDONE (RISPERIDONE M-TAB) 1 MG disintegrating tablet Take 1 tablet (1 mg total) by mouth 2 (two) times daily. Patient not taking: Reported on 12/21/2015 09/08/15   Hillard Danker Joy, PA-C  silver sulfADIAZINE (SILVADENE) 1 % cream Apply 1 application topically daily. Patient not taking: Reported on 12/21/2015 08/14/15   Dione Booze, MD  tamsulosin (FLOMAX) 0.4 MG CAPS capsule Take 1 capsule (0.4 mg total) by mouth daily. Patient not taking: Reported on 12/21/2015 09/08/15   Hillard Danker Joy, PA-C  traZODone (DESYREL) 50 MG tablet Take 1 tablet (50 mg total) by mouth at bedtime. Patient not taking: Reported on 12/21/2015 07/28/15   Earney Navy, NP   BP 113/77 mmHg  Pulse 99  Temp(Src) 98.4 F (36.9 C) (Oral)  Resp 20  SpO2 99% Physical Exam  ED Course  Procedures (including critical care time) Labs  Review Labs Reviewed - No data to display  Imaging Review Dg Lumbar Spine Complete  12/25/2015  CLINICAL DATA:  Larey Seat today, back pain EXAM: LUMBAR SPINE - COMPLETE 4+ VIEW COMPARISON:  None. FINDINGS: Normal anterior-posterior alignment. No fracture. Mild degenerative disc disease at L2-3 and L4-5. IMPRESSION: No acute findings Electronically Signed   By: Esperanza Heir M.D.   On: 12/25/2015 21:59   Ct Head Wo Contrast  12/25/2015  CLINICAL DATA:  Head pain after fall at gas station. History of hypertension, diabetes and bipolar. EXAM: CT HEAD WITHOUT CONTRAST CT CERVICAL SPINE WITHOUT CONTRAST TECHNIQUE: Multidetector CT imaging of the head and cervical spine was performed following the standard protocol without intravenous contrast. Multiplanar CT image reconstructions of the cervical spine were also generated. COMPARISON:  Cervical spine radiograph December 19, 2015, CT HEAD and cervical spine September 19, 2015 FINDINGS: CT HEAD FINDINGS INTRACRANIAL CONTENTS: The ventricles and sulci are normal. No intraparenchymal hemorrhage, mass effect nor midline shift. No acute large vascular territory infarcts. No abnormal extra-axial fluid collections. Basal cisterns are patent. ORBITS: The included ocular globes and orbital contents are normal. SINUSES: The mastoid aircells and included paranasal sinuses are well-aerated. SKULL/SOFT TISSUES: Small LEFT parietal scalp hematoma without subcutaneous gas or radiopaque foreign bodies. No skull fracture. CT CERVICAL SPINE FINDINGS OSSEOUS STRUCTURES: Cervical vertebral bodies and posterior elements intact. Straightened cervical spine lordosis. Status post C3 through C5 ACDF with solid interbody fusion. 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 No destructive bony lesions. SOFT TISSUES: Included prevertebral and paraspinal soft tissues are unremarkable. IMPRESSION: CT HEAD: Small LEFT parietal scalp hematoma. No skull fracture. Otherwise  negative CT HEAD. CT CERVICAL SPINE: No acute fracture or malalignment. Status post C3 through C5 ACDF with solid fusion. Electronically Signed   By: Awilda Metro M.D.   On: 12/25/2015 22:17   Ct Cervical Spine Wo Contrast  12/25/2015  CLINICAL DATA:  Head pain after fall at gas station. History of hypertension, diabetes and bipolar. EXAM: CT HEAD WITHOUT CONTRAST CT CERVICAL SPINE WITHOUT CONTRAST TECHNIQUE: Multidetector CT imaging of the head and cervical spine was performed following the standard protocol without intravenous contrast. Multiplanar CT image reconstructions of the cervical spine were also generated. COMPARISON:  Cervical spine radiograph December 19, 2015, CT HEAD and cervical spine September 19, 2015 FINDINGS: CT HEAD FINDINGS INTRACRANIAL CONTENTS: The ventricles and sulci are normal. No intraparenchymal hemorrhage, mass effect nor midline shift. No acute large vascular territory infarcts. No abnormal extra-axial fluid collections. Basal cisterns are patent. ORBITS: The included ocular globes and orbital contents are normal. SINUSES: The mastoid aircells and included paranasal sinuses are well-aerated. SKULL/SOFT TISSUES: Small LEFT parietal scalp hematoma without subcutaneous gas or radiopaque foreign bodies. No skull fracture. CT CERVICAL SPINE FINDINGS OSSEOUS STRUCTURES: Cervical vertebral bodies and posterior elements intact. Straightened cervical spine lordosis. Status post C3 through C5 ACDF with solid interbody fusion. 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 No destructive  bony lesions. SOFT TISSUES: Included prevertebral and paraspinal soft tissues are unremarkable. IMPRESSION: CT HEAD: Small LEFT parietal scalp hematoma. No skull fracture. Otherwise negative CT HEAD. CT CERVICAL SPINE: No acute fracture or malalignment. Status post C3 through C5 ACDF with solid fusion. Electronically Signed   By: Awilda Metroourtnay  Bloomer M.D.   On: 12/25/2015 22:17   I  have personally reviewed and evaluated these images and lab results as part of my medical decision-making.   EKG Interpretation None      MDM   Final diagnoses:  Fall, initial encounter  Back pain at L4-L5 level    CT scan unremarkable. Lumbar spine series shows no fracture. Patient with back pain and fall. Patient will follow-up with PCP    Bethann BerkshireJoseph Linda Biehn, MD 12/25/15 2246

## 2015-12-25 NOTE — Progress Notes (Signed)
Patient noted to have 41 visits to the ED no admissions within the last six months.  Patient is homeless.  Patient noted to have Medicaid insurance.  Dr. Julio Sickssei-Bonsu listed as patient's pcp.  Patient does not qualify for medication assistance as his medications are three dollars or less or he can request a copay waiver at his pharmacy.  No further EDCM needs st this time.

## 2015-12-25 NOTE — ED Notes (Signed)
Per EMS pt fell at appx 1000 today and hit head. Pt reports head pain along with chronic back and neck pain. Per EMS pt ambulatory on scene.

## 2015-12-25 NOTE — ED Notes (Signed)
Pt reports understanding of discharge information. No questions at time of discharge 

## 2015-12-25 NOTE — Discharge Instructions (Signed)
Follow up with your family md  °

## 2015-12-25 NOTE — ED Notes (Addendum)
PER EMS: pt brought in by EMS, picked up from a hotel with c/o head pain from a fall. He reports he was at a gas station tonight, fell[facility]. He states he fell a second time and so he decided to be transported to ER. No LOC. A&OX4. Knot to occipital region of head, no bleeding.

## 2015-12-25 NOTE — ED Notes (Signed)
Bed: [id number] Expected date:  Expected time:  Means of arrival:  Comments: EMS 54yo M fall / hematoma to head / neck pain / ETOH

## 2015-12-26 ENCOUNTER — Encounter (HOSPITAL_COMMUNITY): Payer: Self-pay

## 2015-12-26 ENCOUNTER — Emergency Department (HOSPITAL_COMMUNITY)
Admission: EM | Admit: 2015-12-26 | Discharge: 2015-12-26 | Disposition: A | Discharge status: Home / Self Care | Payer: Medicaid Other | Attending: Emergency Medicine | Admitting: Emergency Medicine

## 2015-12-26 ENCOUNTER — Emergency Department (HOSPITAL_COMMUNITY): Payer: Medicaid Other

## 2015-12-26 DIAGNOSIS — Z79899 Other long term (current) drug therapy: Secondary | ICD-10-CM | POA: Diagnosis not present

## 2015-12-26 DIAGNOSIS — Y999 Unspecified external cause status: Secondary | ICD-10-CM | POA: Diagnosis not present

## 2015-12-26 DIAGNOSIS — S20212A Contusion of left front wall of thorax, initial encounter: Secondary | ICD-10-CM | POA: Insufficient documentation

## 2015-12-26 DIAGNOSIS — I10 Essential (primary) hypertension: Secondary | ICD-10-CM | POA: Diagnosis not present

## 2015-12-26 DIAGNOSIS — F319 Bipolar disorder, unspecified: Secondary | ICD-10-CM | POA: Diagnosis not present

## 2015-12-26 DIAGNOSIS — W19XXXA Unspecified fall, initial encounter: Secondary | ICD-10-CM | POA: Diagnosis not present

## 2015-12-26 DIAGNOSIS — Z7984 Long term (current) use of oral hypoglycemic drugs: Secondary | ICD-10-CM | POA: Insufficient documentation

## 2015-12-26 DIAGNOSIS — L02414 Cutaneous abscess of left upper limb: Secondary | ICD-10-CM | POA: Diagnosis not present

## 2015-12-26 DIAGNOSIS — Y939 Activity, unspecified: Secondary | ICD-10-CM | POA: Insufficient documentation

## 2015-12-26 DIAGNOSIS — F17210 Nicotine dependence, cigarettes, uncomplicated: Secondary | ICD-10-CM | POA: Diagnosis not present

## 2015-12-26 DIAGNOSIS — S80852A Superficial foreign body, left lower leg, initial encounter: Secondary | ICD-10-CM | POA: Diagnosis not present

## 2015-12-26 DIAGNOSIS — Z794 Long term (current) use of insulin: Secondary | ICD-10-CM | POA: Insufficient documentation

## 2015-12-26 DIAGNOSIS — Y929 Unspecified place or not applicable: Secondary | ICD-10-CM | POA: Insufficient documentation

## 2015-12-26 DIAGNOSIS — M779 Enthesopathy, unspecified: Secondary | ICD-10-CM

## 2015-12-26 DIAGNOSIS — S80252A Superficial foreign body, left knee, initial encounter: Secondary | ICD-10-CM

## 2015-12-26 DIAGNOSIS — E119 Type 2 diabetes mellitus without complications: Secondary | ICD-10-CM | POA: Diagnosis not present

## 2015-12-26 DIAGNOSIS — M25522 Pain in left elbow: Secondary | ICD-10-CM | POA: Diagnosis present

## 2015-12-26 MED ORDER — SULFAMETHOXAZOLE-TRIMETHOPRIM 800-160 MG PO TABS: 1.0000 | ORAL_TABLET | Freq: Two times a day (BID) | ORAL | Status: AC

## 2015-12-26 MED ORDER — LIDOCAINE-EPINEPHRINE (PF) 2 %-1:200000 IJ SOLN: 20.0000 mL | Freq: Once | INTRAMUSCULAR | Status: DC

## 2015-12-26 MED ORDER — BACITRACIN ZINC 500 UNIT/GM EX OINT
TOPICAL_OINTMENT | CUTANEOUS | Status: AC
  Administered 2015-12-26: 18:00:00
  Filled 2015-12-26: qty 1.8

## 2015-12-26 MED ORDER — LIDOCAINE-EPINEPHRINE (PF) 1 %-1:200000 IJ SOLN
20.0000 mL | Freq: Once | INTRAMUSCULAR | Status: AC
  Administered 2015-12-26: 20 mL
  Filled 2015-12-26: qty 30

## 2015-12-26 NOTE — ED Notes (Signed)
Patient transported to X-ray 

## 2015-12-26 NOTE — ED Notes (Signed)
CBG not obtained. Pt attempted to steal the CBG machine that was found in his book bag. Police and security at bedside to escort pt off of property. Dr. Particia NearingHaviland agreeable to patient not having a CBG before discharge.

## 2015-12-26 NOTE — ED Notes (Signed)
Bed: WA20 Expected date:  Expected time:  Means of arrival:  Comments: EMS- left elbow pain

## 2015-12-26 NOTE — Discharge Instructions (Signed)
Chest Contusion °A contusion is a deep bruise. Bruises happen when an injury causes bleeding under the skin. Signs of bruising include pain, puffiness (swelling), and discolored skin. The bruise may turn blue, purple, or yellow.  °HOME CARE °· Put ice on the injured area. °¨ Put ice in a plastic bag. °¨ Place a towel between the skin and the bag. °¨ Leave the ice on for 15-20 minutes at a time, 03-04 times a day for the first 48 hours. °· Only take medicine as told by your doctor. °· Rest. °· Take deep breaths (deep-breathing exercises) as told by your doctor. °· Stop smoking if you smoke. °· Do not lift objects over 5 pounds (2.3 kilograms) for 3 days or longer if told by your doctor. °GET HELP RIGHT AWAY IF:  °· You have more bruising or puffiness. °· You have pain that gets worse. °· You have trouble breathing. °· You are dizzy, weak, or pass out (faint). °· You have blood in your pee (urine) or poop (stool). °· You cough up or throw up (vomit) blood. °· Your puffiness or pain is not helped with medicines. °MAKE SURE YOU:  °· Understand these instructions. °· Will watch your condition. °· Will get help right away if you are not doing well or get worse. °  °This information is not intended to replace advice given to you by your health care provider. Make sure you discuss any questions you have with your health care provider. °  °Document Released: 12/21/2007 Document Revised: 03/28/2012 Document Reviewed: 12/26/2011 °Elsevier Interactive Patient Education ©2016 [employer] ° °

## 2015-12-26 NOTE — ED Notes (Addendum)
Discharge instructions, follow up care, and rx x1 reviewed with patient. Patient verbalized understanding. Patient did not allow RN to obtain vital signs prior to discharge and would not sign signature pad. Patient being escorted out the hospital by security and [employer].

## 2015-12-26 NOTE — ED Notes (Signed)
Pt presents with left elbow pain. Pt reports he fell approx 30 minutes ago. Pt seen in ER multiple times for fall. Pt reports pain with palpation and range of motion. Ambulatory on scene for EMS.

## 2015-12-26 NOTE — ED Provider Notes (Signed)
CSN: [id number]     Arrival date & time 12/26/15  1616 History   First MD Initiated Contact with Patient 12/26/15 1625     Chief Complaint  Patient presents with  . Elbow Pain  PT FELL YESTERDAY AND WAS SEEN HERE IN THE ED.  HE SAID THAT HE FELL AGAIN TODAY.  HE C/O LEFT ELBOW, LEFT HIP, LEFT RIB PAIN.   PT DENIES LOC.  HE WAS AMBULATORY FOR EMS.  PT IS HERE FREQUENTLY AND WAS SEEN BY THE CASE MANAGER YESTERDAY FOR MULTIPLE ED VISITS.  PT IS HOMELESS.   (Consider location/radiation/quality/duration/timing/severity/associated sxs/prior Treatment) The history is provided by the patient.    Past Medical History  Diagnosis Date  . Lumbar spinal cord injury (HCC) 2012  . Bipolar disorder (HCC)   . Depression   . Hypertension   . Diabetes mellitus without complication [facility])    Past Surgical History  Procedure Laterality Date  . Back surgery    . Eye surgery     No family history on file. Social History  Substance Use Topics  . Smoking status: Current Every Day Smoker -- 0.50 packs/day for 10 years    Types: Cigarettes  . Smokeless tobacco: None  . Alcohol Use: Yes     Comment: "Occasional"     Review of Systems  Musculoskeletal:       LEFT ELBOW, LEFT KNEE, LEFT RIB PAIN  All other systems reviewed and are negative.     Allergies  Haldol  Home Medications   Prior to Admission medications   Medication Sig Start Date End Date Taking? Authorizing Provider  ibuprofen (ADVIL,MOTRIN) 800 MG tablet Take 1 tablet (800 mg total) by mouth 3 (three) times daily. Patient taking differently: Take 800 mg by mouth every 6 (six) hours as needed for moderate pain.  08/14/15  Yes Dione Booze, MD  lisinopril (ZESTRIL) 10 MG tablet Take 1 tablet (10 mg total) by mouth daily. 12/20/15  Yes Zadie Rhine, MD  meloxicam (MOBIC) 7.5 MG tablet Take 2 tablets (15 mg total) by mouth daily. 12/19/15  Yes Jerre Simon, PA  metFORMIN (GLUCOPHAGE) 500 MG tablet Take 1 tablet (500 mg total) by mouth  2 (two) times daily with a meal. 12/20/15  Yes Zadie Rhine, MD  cyclobenzaprine (FLEXERIL) 10 MG tablet Take 1 tablet (10 mg total) by mouth 2 (two) times daily as needed for muscle spasms. Patient not taking: Reported on 12/21/2015 07/29/15   Elpidio Anis, PA-C  OLANZapine (ZYPREXA) 20 MG tablet Take 20 mg by mouth at bedtime. Reported on 12/26/2015 12/17/15 01/16/16  Historical Provider, MD  oxyCODONE-acetaminophen (PERCOCET) 5-325 MG tablet Take 1 tablet by mouth every 6 (six) hours as needed. 12/25/15   Bethann Berkshire, MD  risperiDONE (RISPERIDONE M-TAB) 1 MG disintegrating tablet Take 1 tablet (1 mg total) by mouth 2 (two) times daily. Patient not taking: Reported on 12/21/2015 09/08/15   Hillard Danker Joy, PA-C  silver sulfADIAZINE (SILVADENE) 1 % cream Apply 1 application topically daily. Patient not taking: Reported on 12/21/2015 08/14/15   Dione Booze, MD  sulfamethoxazole-trimethoprim (BACTRIM DS,SEPTRA DS) 800-160 MG tablet Take 1 tablet by mouth 2 (two) times daily. 12/26/15 01/02/16  Jacalyn Lefevre, MD  tamsulosin (FLOMAX) 0.4 MG CAPS capsule Take 1 capsule (0.4 mg total) by mouth daily. Patient not taking: Reported on 12/21/2015 09/08/15   Hillard Danker Joy, PA-C  traZODone (DESYREL) 50 MG tablet Take 1 tablet (50 mg total) by mouth at bedtime. Patient not taking: Reported on  12/21/2015 07/28/15   Earney NavyJosephine C Onuoha, NP   BP 120/76 mmHg  Pulse 99  Temp(Src) 98 F (36.7 C) (Oral)  Resp 16  Ht 5\' 8"  (1.727 m)  Wt 170 lb (77.111 kg)  BMI 25.85 kg/m2  SpO2 98% Physical Exam  Constitutional: He is oriented to person, place, and time. He appears well-developed and well-nourished.  HENT:  Head: Normocephalic and atraumatic.  Right Ear: External ear normal.  Left Ear: External ear normal.  Nose: Nose normal.  Mouth/Throat: Oropharynx is clear and moist.  Eyes: Conjunctivae and EOM are normal. Pupils are equal, round, and reactive to light.  Neck: Normal range of motion. Neck supple.  Cardiovascular: Normal  rate, regular rhythm, normal heart sounds and intact distal pulses.   Pulmonary/Chest: Effort normal and breath sounds normal.  Abdominal: Soft. Bowel sounds are normal.  Musculoskeletal:       Left knee: Tenderness found.       Left forearm: He exhibits tenderness and swelling.  LEFT CHEST WALL TENDERNESS  Neurological: He is alert and oriented to person, place, and time.  Skin:     Psychiatric: He has a normal mood and affect. His behavior is normal.  Nursing note and vitals reviewed.   ED Course  .Marland Kitchen.Incision and Drainage Date/Time: 12/26/2015 5:56 PM Performed by: Jacalyn LefevreHAVILAND, Elba Schaber Authorized by: Jacalyn LefevreHAVILAND, Joeann Steppe Consent: Verbal consent obtained. Risks and benefits: risks, benefits and alternatives were discussed Consent given by: patient Patient understanding: patient states understanding of the procedure being performed Patient consent: the patient's understanding of the procedure matches consent given Procedure consent: procedure consent matches procedure scheduled Relevant documents: relevant documents present and verified Test results: test results available and properly labeled Site marked: the operative site was marked Patient identity confirmed: verbally with patient Time out: Immediately prior to procedure a "time out" was called to verify the correct patient, procedure, equipment, support staff and site/side marked as required. Type: abscess Body area: upper extremity Location details: left arm Anesthesia: local infiltration Local anesthetic: lidocaine 2% with epinephrine Anesthetic total: 2 ml Patient sedated: no Scalpel size: 11 Needle gauge: 22 Incision type: single straight Incision depth: dermal Complexity: simple Drainage: bloody Drainage amount: moderate Wound treatment: wound left open Patient tolerance: Patient tolerated the procedure well with no immediate complications   (including critical care time) Labs Review Labs Reviewed  CBG MONITORING, ED      Imaging Review Dg Ribs Unilateral W/chest Left  12/26/2015  CLINICAL DATA:  Left anterior and inferior rib pain following a fall today. EXAM: LEFT RIBS AND CHEST - 3+ VIEW COMPARISON:  Chest radiographs dated 08/20/2015 and thoracic spine radiographs dated 12/19/2015. FINDINGS: Normal sized heart. Clear lungs. No fracture or pneumothorax seen. Left shoulder fixation anchors. Cervical spine fixation hardware. IMPRESSION: No acute abnormality. Electronically Signed   By: Beckie SaltsSteven  Reid M.D.   On: 12/26/2015 18:10   Dg Lumbar Spine Complete  12/25/2015  CLINICAL DATA:  Larey SeatFell today, back pain EXAM: LUMBAR SPINE - COMPLETE 4+ VIEW COMPARISON:  None. FINDINGS: Normal anterior-posterior alignment. No fracture. Mild degenerative disc disease at L2-3 and L4-5. IMPRESSION: No acute findings Electronically Signed   By: Esperanza Heiraymond  Rubner M.D.   On: 12/25/2015 21:59   Dg Elbow Complete Left  12/26/2015  CLINICAL DATA:  Left elbow pain following a fall today. EXAM: LEFT ELBOW - COMPLETE 3+ VIEW COMPARISON:  12/19/2015. FINDINGS: Mild degenerative changes. No fracture, dislocation or effusion seen. Posterior soft tissue swelling is noted. IMPRESSION: No fracture or dislocation.  Stable  mild degenerative changes. Electronically Signed   By: Beckie Salts M.D.   On: 12/26/2015 18:06   Ct Head Wo Contrast  12/25/2015  CLINICAL DATA:  Head pain after fall at gas station. History of hypertension, diabetes and bipolar. EXAM: CT HEAD WITHOUT CONTRAST CT CERVICAL SPINE WITHOUT CONTRAST TECHNIQUE: Multidetector CT imaging of the head and cervical spine was performed following the standard protocol without intravenous contrast. Multiplanar CT image reconstructions of the cervical spine were also generated. COMPARISON:  Cervical spine radiograph December 19, 2015, CT HEAD and cervical spine September 19, 2015 FINDINGS: CT HEAD FINDINGS INTRACRANIAL CONTENTS: The ventricles and sulci are normal. No intraparenchymal hemorrhage, mass effect nor  midline shift. No acute large vascular territory infarcts. No abnormal extra-axial fluid collections. Basal cisterns are patent. ORBITS: The included ocular globes and orbital contents are normal. SINUSES: The mastoid aircells and included paranasal sinuses are well-aerated. SKULL/SOFT TISSUES: Small LEFT parietal scalp hematoma without subcutaneous gas or radiopaque foreign bodies. No skull fracture. CT CERVICAL SPINE FINDINGS OSSEOUS STRUCTURES: Cervical vertebral bodies and posterior elements intact. Straightened cervical spine lordosis. Status post C3 through C5 ACDF with solid interbody fusion. 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 No destructive bony lesions. SOFT TISSUES: Included prevertebral and paraspinal soft tissues are unremarkable. IMPRESSION: CT HEAD: Small LEFT parietal scalp hematoma. No skull fracture. Otherwise negative CT HEAD. CT CERVICAL SPINE: No acute fracture or malalignment. Status post C3 through C5 ACDF with solid fusion. Electronically Signed   By: Awilda Metro M.D.   On: 12/25/2015 22:17   Ct Cervical Spine Wo Contrast  12/25/2015  CLINICAL DATA:  Head pain after fall at gas station. History of hypertension, diabetes and bipolar. EXAM: CT HEAD WITHOUT CONTRAST CT CERVICAL SPINE WITHOUT CONTRAST TECHNIQUE: Multidetector CT imaging of the head and cervical spine was performed following the standard protocol without intravenous contrast. Multiplanar CT image reconstructions of the cervical spine were also generated. COMPARISON:  Cervical spine radiograph December 19, 2015, CT HEAD and cervical spine September 19, 2015 FINDINGS: CT HEAD FINDINGS INTRACRANIAL CONTENTS: The ventricles and sulci are normal. No intraparenchymal hemorrhage, mass effect nor midline shift. No acute large vascular territory infarcts. No abnormal extra-axial fluid collections. Basal cisterns are patent. ORBITS: The included ocular globes and orbital contents are normal.  SINUSES: The mastoid aircells and included paranasal sinuses are well-aerated. SKULL/SOFT TISSUES: Small LEFT parietal scalp hematoma without subcutaneous gas or radiopaque foreign bodies. No skull fracture. CT CERVICAL SPINE FINDINGS OSSEOUS STRUCTURES: Cervical vertebral bodies and posterior elements intact. Straightened cervical spine lordosis. Status post C3 through C5 ACDF with solid interbody fusion. 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 No destructive bony lesions. SOFT TISSUES: Included prevertebral and paraspinal soft tissues are unremarkable. IMPRESSION: CT HEAD: Small LEFT parietal scalp hematoma. No skull fracture. Otherwise negative CT HEAD. CT CERVICAL SPINE: No acute fracture or malalignment. Status post C3 through C5 ACDF with solid fusion. Electronically Signed   By: Awilda Metro M.D.   On: 12/25/2015 22:17   Dg Knee Complete 4 Views Left  12/26/2015  CLINICAL DATA:  Left knee pain following a fall today. EXAM: LEFT KNEE - COMPLETE 4+ VIEW COMPARISON:  None. FINDINGS: A linear metallic foreign body is demonstrated in the soft tissues posteriorly and medially at the level of the distal femoral metaphysis. The a moderately large superior, anterior patellar spur is demonstrated with a fracture through the proximal portion of the spur. A moderate-sized inferior patellar spurs also noted is  was minimal posterior patellar spur formation. There is also mild lateral spur formation. A small effusion is noted. IMPRESSION: 1. Fracture through the proximal aspect of a suprapatellar spur. 2. Small effusion. 3. Mild degenerative changes. 4. Broken sewing needle in the posterior, medial soft tissues. Electronically Signed   By: Beckie Salts M.D.   On: 12/26/2015 18:09   I have personally reviewed and evaluated these images and lab results as part of my medical decision-making.   EKG Interpretation None      MDM  PT IS AMBULATORY.  HE IS GIVEN NUMBER TO  ORTHO AND A KNEE IMMOBILIZER.  HE KNOWS TO RETURN IF WORSE. Final diagnoses:  Abscess of forearm, left  Foreign body of knee, left, initial encounter  Bone spur of other site  Chest wall contusion, left, initial encounter       Jacalyn Lefevre, MD 12/26/15 [phone]

## 2016-02-02 NOTE — ED Provider Notes (Signed)
CSN: [id number]     Arrival date & time 12/25/15  2038 History   First MD Initiated Contact with Patient 12/25/15 2058     Chief Complaint  Patient presents with  . Fall     (Consider location/radiation/quality/duration/timing/severity/associated sxs/prior Treatment) Patient is a 55 y.o. male presenting with fall. The history is provided by the patient (Patient states he fell and hit his head no loss of consciousness patient has lower back pain and headache).  Fall This is a new problem. The current episode started 1 to 2 hours ago. The problem occurs constantly. The problem has not changed since onset.Associated symptoms include headaches. Pertinent negatives include no chest pain and no abdominal pain. Nothing aggravates the symptoms. Nothing relieves the symptoms.    Past Medical History  Diagnosis Date  . Lumbar spinal cord injury (HCC) 2012  . Bipolar disorder (HCC)   . Depression   . Hypertension   . Diabetes mellitus without complication [facility])    Past Surgical History  Procedure Laterality Date  . Back surgery    . Eye surgery     History reviewed. No pertinent family history. Social History  Substance Use Topics  . Smoking status: Current Every Day Smoker -- 0.50 packs/day for 10 years    Types: Cigarettes  . Smokeless tobacco: None  . Alcohol Use: Yes     Comment: "Occasional"     Review of Systems  Constitutional: Negative for appetite change and fatigue.  HENT: Negative for congestion, ear discharge and sinus pressure.   Eyes: Negative for discharge.  Respiratory: Negative for cough.   Cardiovascular: Negative for chest pain.  Gastrointestinal: Negative for abdominal pain and diarrhea.  Genitourinary: Negative for frequency and hematuria.  Musculoskeletal: Positive for back pain.  Skin: Negative for rash.  Neurological: Positive for headaches. Negative for seizures.  Psychiatric/Behavioral: Negative for hallucinations.      Allergies  Haldol  Home  Medications   Prior to Admission medications   Medication Sig Start Date End Date Taking? Authorizing Provider  lisinopril (ZESTRIL) 10 MG tablet Take 1 tablet (10 mg total) by mouth daily. 12/20/15  Yes Zadie Rhine, MD  meloxicam (MOBIC) 7.5 MG tablet Take 2 tablets (15 mg total) by mouth daily. 12/19/15  Yes Jerre Simon, PA  metFORMIN (GLUCOPHAGE) 500 MG tablet Take 1 tablet (500 mg total) by mouth 2 (two) times daily with a meal. 12/20/15  Yes Zadie Rhine, MD  cyclobenzaprine (FLEXERIL) 10 MG tablet Take 1 tablet (10 mg total) by mouth 2 (two) times daily as needed for muscle spasms. Patient not taking: Reported on 12/21/2015 07/29/15   Elpidio Anis, PA-C  ibuprofen (ADVIL,MOTRIN) 800 MG tablet Take 1 tablet (800 mg total) by mouth 3 (three) times daily. Patient taking differently: Take 800 mg by mouth every 6 (six) hours as needed for moderate pain.  08/14/15   Dione Booze, MD  oxyCODONE-acetaminophen (PERCOCET) 5-325 MG tablet Take 1 tablet by mouth every 6 (six) hours as needed. 12/25/15   Bethann Berkshire, MD  risperiDONE (RISPERIDONE M-TAB) 1 MG disintegrating tablet Take 1 tablet (1 mg total) by mouth 2 (two) times daily. Patient not taking: Reported on 12/21/2015 09/08/15   Hillard Danker Joy, PA-C  silver sulfADIAZINE (SILVADENE) 1 % cream Apply 1 application topically daily. Patient not taking: Reported on 12/21/2015 08/14/15   Dione Booze, MD  tamsulosin (FLOMAX) 0.4 MG CAPS capsule Take 1 capsule (0.4 mg total) by mouth daily. Patient not taking: Reported on 12/21/2015 09/08/15  Shawn C Joy, PA-C  traZODone (DESYREL) 50 MG tablet Take 1 tablet (50 mg total) by mouth at bedtime. Patient not taking: Reported on 12/21/2015 07/28/15   Earney NavyJosephine C Onuoha, NP   BP 142/95 mmHg  Pulse 97  Temp(Src) 98.2 F (36.8 C) (Oral)  Resp 18  SpO2 99% Physical Exam  Constitutional: He is oriented to person, place, and time. He appears well-developed.  HENT:  Head: Normocephalic.  Tenderness to occipital head  mild swelling  Eyes: Conjunctivae and EOM are normal. No scleral icterus.  Neck: Neck supple. No thyromegaly present.  Cardiovascular: Normal rate and regular rhythm.  Exam reveals no gallop and no friction rub.   No murmur heard. Pulmonary/Chest: No stridor. He has no wheezes. He has no rales. He exhibits no tenderness.  Abdominal: He exhibits no distension. There is no tenderness. There is no rebound.  Musculoskeletal: Normal range of motion. He exhibits tenderness. He exhibits no edema.  Tenderness to lumbar spine  Lymphadenopathy:    He has no cervical adenopathy.  Neurological: He is oriented to person, place, and time. He exhibits normal muscle tone. Coordination normal.  Skin: No rash noted. No erythema.  Psychiatric: He has a normal mood and affect. His behavior is normal.    ED Course  Procedures (including critical care time) Labs Review Labs Reviewed - No data to display  Imaging Review No results found. I have personally reviewed and evaluated these images and lab results as part of my medical decision-making.   EKG Interpretation None      MDM   Final diagnoses:  Fall, initial encounter  Back pain at L4-L5 level    CT of the head and cervical spine negative lumbar spine films negative patient has contusion to head and contusion to lower lumbar spine    Bethann BerkshireJoseph Harsh Trulock, MD 02/02/16 1338

## 2016-11-07 ENCOUNTER — Emergency Department (HOSPITAL_COMMUNITY)
Admission: EM | Admit: 2016-11-07 | Discharge: 2016-11-07 | Disposition: A | Discharge status: Home / Self Care | Payer: Medicaid Other | Attending: Emergency Medicine | Admitting: Emergency Medicine

## 2016-11-07 ENCOUNTER — Emergency Department (HOSPITAL_COMMUNITY): Payer: Medicaid Other

## 2016-11-07 DIAGNOSIS — R079 Chest pain, unspecified: Secondary | ICD-10-CM

## 2016-11-07 DIAGNOSIS — R0789 Other chest pain: Secondary | ICD-10-CM | POA: Diagnosis not present

## 2016-11-07 DIAGNOSIS — E119 Type 2 diabetes mellitus without complications: Secondary | ICD-10-CM | POA: Diagnosis not present

## 2016-11-07 DIAGNOSIS — I10 Essential (primary) hypertension: Secondary | ICD-10-CM | POA: Diagnosis not present

## 2016-11-07 DIAGNOSIS — Z79899 Other long term (current) drug therapy: Secondary | ICD-10-CM | POA: Insufficient documentation

## 2016-11-07 DIAGNOSIS — F17210 Nicotine dependence, cigarettes, uncomplicated: Secondary | ICD-10-CM | POA: Diagnosis not present

## 2016-11-07 DIAGNOSIS — Z7984 Long term (current) use of oral hypoglycemic drugs: Secondary | ICD-10-CM | POA: Insufficient documentation

## 2016-11-07 LAB — BASIC METABOLIC PANEL
Anion gap: 11 (ref 5–15)
BUN: 15 mg/dL (ref 6–20)
CALCIUM: 8.8 mg/dL — AB (ref 8.9–10.3)
CHLORIDE: 103 mmol/L (ref 101–111)
CO2: 21 mmol/L — AB (ref 22–32)
Creatinine, Ser: 1.18 mg/dL (ref 0.61–1.24)
GFR calc Af Amer: >60 mL/min (ref >60)
GFR calc non Af Amer: >60 mL/min (ref >60)
GLUCOSE: 296 mg/dL — AB (ref 65–99)
Potassium: 3.8 mmol/L (ref 3.5–5.1)
Sodium: 135 mmol/L (ref 135–145)

## 2016-11-07 LAB — CBC
HCT: 40.6 % (ref 39.0–52.0)
HEMOGLOBIN: 13.7 g/dL (ref 13.0–17.0)
MCH: 27.5 pg (ref 26.0–34.0)
MCHC: 33.7 g/dL (ref 30.0–36.0)
MCV: 81.5 fL (ref 78.0–100.0)
Platelets: 281 10*3/uL (ref 150–400)
RBC: 4.98 MIL/uL (ref 4.22–5.81)
RDW: 14.1 % (ref 11.5–15.5)
WBC: 9.4 10*3/uL (ref 4.0–10.5)

## 2016-11-07 LAB — I-STAT TROPONIN, ED: TROPONIN I, POC: 0 ng/mL (ref 0.00–0.08)

## 2016-11-07 MED ORDER — KETOROLAC TROMETHAMINE 30 MG/ML IJ SOLN
15.0000 mg | Freq: Once | INTRAMUSCULAR | Status: AC
  Administered 2016-11-07: 15 mg via INTRAVENOUS
  Filled 2016-11-07: qty 1
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
## 2016-11-07 NOTE — ED Provider Notes (Signed)
[facility]-EMER[facility]Y DEPT Provider Note   CSN: [id number] Arrival date & time: 11/07/16  0123  By signing my name below, I, Majel Homer, attest that this documentation has been prepared under the direction and in the presence of [name], MD . Electronically Signed: Majel Homer, Scribe. 11/07/2016. 2:11 AM.  History   Chief Complaint Chief Complaint  Patient presents with  . Chest Pain   The history is provided by the patient. No language interpreter was used.   HPI Comments: Charles Leon is a 56 y.o. male with PMHx of DM, HTN, MI in 1979, and lumbar spinal cord injury, brought in by EMS from [facility] to the Emergency Department complaining of intermittent, "sharp," right-sided chest pain that began ~3 days ago. Pt reports associated diaphoresis and states his pain is exacerbated with heavy lifting. He has been working out recently attempting to get more tone.  The pain is worse when he pushes into his R chest wall.  He denies L sided chest pain.  He was given 324 ASA by EMS en route to the ED with mild relief of his pain. He denies any shortness of breath, nausea, or vomiting.   Past Medical History:  Diagnosis Date  . Bipolar disorder (HCC)   . Depression   . Diabetes mellitus without complication (HCC)   . Hypertension   . Lumbar spinal cord injury [facility]) 2012    Patient Active Problem List   Diagnosis Date Noted  . 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 Bipolar 1 disorder, depressed (HCC) 06/06/2014    Past Surgical History:  Procedure Laterality Date  . BACK SURGERY    . EYE SURGERY      Home Medications    Prior to Admission medications   Medication Sig Start Date End Date Taking? Authorizing Provider  traZODone (DESYREL) 50 MG tablet Take 1 tablet (50 mg total) by mouth at bedtime. 07/28/15  Yes Earney Navy, NP  cyclobenzaprine (FLEXERIL) 10 MG tablet Take 1 tablet (10 mg total) by mouth 2 (two) times daily as needed  for muscle spasms. Patient not taking: Reported on 12/21/2015 07/29/15   Elpidio Anis, PA-C  ibuprofen (ADVIL,MOTRIN) 800 MG tablet Take 1 tablet (800 mg total) by mouth 3 (three) times daily. Patient not taking: Reported on 11/07/2016 08/14/15   Dione Booze, MD  lisinopril (ZESTRIL) 10 MG tablet Take 1 tablet (10 mg total) by mouth daily. Patient not taking: Reported on 11/07/2016 12/20/15   Zadie Rhine, MD  meloxicam (MOBIC) 7.5 MG tablet Take 2 tablets (15 mg total) by mouth daily. Patient not taking: Reported on 11/07/2016 12/19/15   Jerre Simon, PA  metFORMIN (GLUCOPHAGE) 500 MG tablet Take 1 tablet (500 mg total) by mouth 2 (two) times daily with a meal. Patient not taking: Reported on 11/07/2016 12/20/15   Zadie Rhine, MD  oxyCODONE-acetaminophen (PERCOCET) 5-325 MG tablet Take 1 tablet by mouth every 6 (six) hours as needed. Patient not taking: Reported on 11/07/2016 12/25/15   Bethann Berkshire, MD  risperiDONE (RISPERIDONE M-TAB) 1 MG disintegrating tablet Take 1 tablet (1 mg total) by mouth 2 (two) times daily. Patient not taking: Reported on 12/21/2015 09/08/15   Hillard Danker Joy, PA-C  silver sulfADIAZINE (SILVADENE) 1 % cream Apply 1 application topically daily. Patient not taking: Reported on 12/21/2015 08/14/15   Dione Booze, MD  tamsulosin (FLOMAX) 0.4 MG CAPS capsule Take 1 capsule (0.4 mg total) by mouth daily. Patient not taking: Reported on 12/21/2015  09/08/15   Anselm Pancoast, PA-C    Family History No family history on file.  Social History Social History  Substance Use Topics  . Smoking status: Current Every Day Smoker    Packs/day: 0.50    Years: 10.00    Types: Cigarettes  . Smokeless tobacco: Not on file  . Alcohol use Yes     Comment: "Occasional"      Allergies   Haldol [haloperidol]   Review of Systems Review of Systems  Constitutional: Positive for diaphoresis.  Respiratory: Negative for shortness of breath.   Cardiovascular: Positive for chest pain.    Gastrointestinal: Negative for nausea and vomiting.  All other systems reviewed and are negative.  Physical Exam Updated Vital Signs BP (!) 123/92 (BP Location: Left Arm)   Pulse 88   Temp 98.4 F (36.9 C) (Oral)   Resp 18   SpO2 96%   Physical Exam  Constitutional: He is oriented to person, place, and time. Vital signs are normal. He appears well-developed and well-nourished.  Non-toxic appearance. He does not appear ill. No distress.  HENT:  Head: Normocephalic and atraumatic.  Nose: Nose normal.  Mouth/Throat: Oropharynx is clear and moist. No oropharyngeal exudate.  Eyes: Conjunctivae and EOM are normal. Pupils are equal, round, and reactive to light. No scleral icterus.  Neck: Normal range of motion. Neck supple. No tracheal deviation, no edema, no erythema and normal range of motion present. No thyroid mass and no thyromegaly present.  Cardiovascular: Normal rate, regular rhythm, S1 normal, S2 normal, normal heart sounds, intact distal pulses and normal pulses.  Exam reveals no gallop and no friction rub.   No murmur heard. Pulmonary/Chest: Effort normal and breath sounds normal. No respiratory distress. He has no wheezes. He has no rhonchi. He has no rales.  TTP of right anterior chest wall.   Abdominal: Soft. Normal appearance and bowel sounds are normal. He exhibits no distension, no ascites and no mass. There is no hepatosplenomegaly. There is no tenderness. There is no rebound, no guarding and no CVA tenderness.  Musculoskeletal: Normal range of motion. He exhibits no edema or tenderness.  Lymphadenopathy:    He has no cervical adenopathy.  Neurological: He is alert and oriented to person, place, and time. He has normal strength. No cranial nerve deficit or sensory deficit.  Skin: Skin is warm, dry and intact. No petechiae and no rash noted. He is not diaphoretic. No erythema. No pallor.  Nursing note and vitals reviewed.  ED Treatments / Results  DIAGNOSTIC  STUDIES:  Oxygen Saturation is 96% on RA, normal by my interpretation.    COORDINATION OF CARE:  2:03 AM Discussed treatment plan with pt at bedside and pt agreed to plan.  Labs (all labs ordered are listed, but only abnormal results are displayed) Labs Reviewed  BASIC METABOLIC PANEL - Abnormal; Notable for the following:       Result Value   CO2 21 (*)    Glucose, Bld 296 (*)    Calcium 8.8 (*)    All other components within normal limits  CBC  I-STAT TROPOININ, ED    EKG  EKG Interpretation  Date/Time:  Monday November 07 2016 01:29:46 EDT Ventricular Rate:  90 PR Interval:    QRS Duration: 69 QT Interval:  362 QTC Calculation: 443 R Axis:   36 Text Interpretation:  Sinus rhythm Anteroseptal infarct, old PRWP is new Confirmed by Erroll Luna [phone]) on 11/07/2016 1:37:56 AM  Radiology Dg Chest 2 View  Result Date: 11/07/2016 CLINICAL DATA:  Mid chest pain tonight EXAM: CHEST  2 VIEW COMPARISON:  10/29/2016 FINDINGS: The heart size and mediastinal contours are within normal limits. Minimal atelectasis at the left lung base. No pneumonic consolidation or CHF. No effusion or pneumothorax. ACDF of the visualized lower cervical spine. Undersurface spurring at the right [facility] joint. The visualized skeletal structures are nonacute. IMPRESSION: No active cardiopulmonary disease. Electronically Signed   By: Tollie Eth M.D.   On: 11/07/2016 02:18    Procedures Procedures (including critical care time)  Medications Ordered in ED Medications  ketorolac (TORADOL) 30 MG/ML injection 15 mg (not administered)     Initial Impression / Assessment and Plan / ED Course  I have reviewed the triage vital signs and the nursing notes.  Pertinent labs & imaging results that were available during my care of the patient were reviewed by me and considered in my medical decision making (see chart for details).     Patient presents to the ED for chest pain. History does not  sound cardiac to me.  There is no pressure or radiation of the pain. No associated vomiting or SOB.  Troponin was not clinically indicated but ordered by triage and is negative.  EKG does not reveal any acute changes. He appears well and in NAD. VS remain within his normal limits and he is safe for Dc.   I personally performed the services described in this documentation, which was scribed in my presence. The recorded information has been reviewed and is accurate.     Final Clinical Impressions(s) / ED Diagnoses   Final diagnoses:  Chest pain, unspecified type    New Prescriptions New Prescriptions   No medications on file     [name], MD 11/07/16 [phone]

## 2016-11-07 NOTE — ED Notes (Signed)
Pt departed in NAD.  

## 2016-11-09 ENCOUNTER — Emergency Department (HOSPITAL_COMMUNITY): Payer: Medicaid Other

## 2016-11-09 ENCOUNTER — Emergency Department (HOSPITAL_COMMUNITY)
Admission: EM | Admit: 2016-11-09 | Discharge: 2016-11-10 | Disposition: A | Discharge status: Home / Self Care | Payer: Medicaid Other | Attending: Emergency Medicine | Admitting: Emergency Medicine

## 2016-11-09 ENCOUNTER — Encounter (HOSPITAL_COMMUNITY): Payer: Self-pay | Admitting: Vascular Surgery

## 2016-11-09 DIAGNOSIS — Y999 Unspecified external cause status: Secondary | ICD-10-CM | POA: Insufficient documentation

## 2016-11-09 DIAGNOSIS — Y9289 Other specified places as the place of occurrence of the external cause: Secondary | ICD-10-CM | POA: Diagnosis not present

## 2016-11-09 DIAGNOSIS — W0110XA Fall on same level from slipping, tripping and stumbling with subsequent striking against unspecified object, initial encounter: Secondary | ICD-10-CM | POA: Insufficient documentation

## 2016-11-09 DIAGNOSIS — S3992XA Unspecified injury of lower back, initial encounter: Secondary | ICD-10-CM | POA: Diagnosis present

## 2016-11-09 DIAGNOSIS — Z79899 Other long term (current) drug therapy: Secondary | ICD-10-CM | POA: Diagnosis not present

## 2016-11-09 DIAGNOSIS — I10 Essential (primary) hypertension: Secondary | ICD-10-CM | POA: Insufficient documentation

## 2016-11-09 DIAGNOSIS — Y9389 Activity, other specified: Secondary | ICD-10-CM | POA: Diagnosis not present

## 2016-11-09 DIAGNOSIS — F17210 Nicotine dependence, cigarettes, uncomplicated: Secondary | ICD-10-CM | POA: Insufficient documentation

## 2016-11-09 DIAGNOSIS — E119 Type 2 diabetes mellitus without complications: Secondary | ICD-10-CM | POA: Diagnosis not present

## 2016-11-09 DIAGNOSIS — S300XXA Contusion of lower back and pelvis, initial encounter: Secondary | ICD-10-CM | POA: Diagnosis not present

## 2016-11-09 DIAGNOSIS — Z7984 Long term (current) use of oral hypoglycemic drugs: Secondary | ICD-10-CM | POA: Insufficient documentation

## 2016-11-09 NOTE — ED Triage Notes (Signed)
Pt reports to the ED for eval of low back pain following a fall that occurred today. He states he backed up and tripped over someone and struck his back on a table. Denies any numbness, tingling, paralysis, bowel or bladder incontinence. He denies any head injury or LOC.

## 2016-11-09 NOTE — ED Notes (Signed)
Patient transported to X-ray 

## 2016-11-09 NOTE — ED Provider Notes (Signed)
[facility] DEPT Provider Note   CSN: [id number] Arrival date & time: 11/09/16  2056   By signing my name below, I, Clovis Pu, attest that this documentation has been prepared under the direction and in the presence of  Kerrie Buffalo, NP. Electronically Signed: Clovis Pu, ED Scribe. 11/09/16. 10:45 PM.   History   Chief Complaint Chief Complaint  Patient presents with  . Fall  . Back Pain    HPI Comments:  Charles Leon is a 56 y.o. male, with a PMHx of lumbar spinal cord injury, who presents to the Emergency Department complaining of acute onset, moderate lower back pain s/p a fall which occurred earlier today. Pt states he backed up, tripped and fell backwards onto a table striking his back. He notes he was able to catch himself and did not completely fall to the ground. No alleviating factors noted. Pt denies headaches, abdominal pain, nausea, vomiting, hematuria, blurry vision, LOC, dizziness, head injury, nosebleed, ear bleeding or any other associated symptoms. No other complaints noted at this time.   The history is provided by the patient. No language interpreter was used.  Fall  This is a new problem. The current episode started 3 to 5 hours ago. The problem occurs constantly. The problem has not changed since onset.Pertinent negatives include no abdominal pain and no headaches. Exacerbated by: palpation. Nothing relieves the symptoms. He has tried nothing for the symptoms. The treatment provided no relief.  Back Pain   This is a new problem. The current episode started 3 to 5 hours ago. The problem occurs constantly. The problem has not changed since onset.The pain is associated with falling. The pain does not radiate. The pain is moderate. Pertinent negatives include no headaches and no abdominal pain. He has tried nothing for the symptoms. The treatment provided no relief.    Past Medical History:  Diagnosis Date  . Bipolar disorder (HCC)   . Depression   . Diabetes  mellitus without complication (HCC)   . Hypertension   . Lumbar spinal cord injury [facility]) 2012    Patient Active Problem List   Diagnosis Date Noted  . 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 Bipolar 1 disorder, depressed (HCC) 06/06/2014    Past Surgical History:  Procedure Laterality Date  . BACK SURGERY    . EYE SURGERY      Home Medications    Prior to Admission medications   Medication Sig Start Date End Date Taking? Authorizing Provider  cyclobenzaprine (FLEXERIL) 10 MG tablet Take 1 tablet (10 mg total) by mouth 2 (two) times daily as needed for muscle spasms. Patient not taking: Reported on 12/21/2015 07/29/15   Elpidio Anis, PA-C  ibuprofen (ADVIL,MOTRIN) 800 MG tablet Take 1 tablet (800 mg total) by mouth 3 (three) times daily. Patient not taking: Reported on 11/07/2016 08/14/15   Dione Booze, MD  lisinopril (ZESTRIL) 10 MG tablet Take 1 tablet (10 mg total) by mouth daily. Patient not taking: Reported on 11/07/2016 12/20/15   Zadie Rhine, MD  meloxicam (MOBIC) 7.5 MG tablet Take 2 tablets (15 mg total) by mouth daily. Patient not taking: Reported on 11/07/2016 12/19/15   Jerre Simon, PA  metFORMIN (GLUCOPHAGE) 500 MG tablet Take 1 tablet (500 mg total) by mouth 2 (two) times daily with a meal. Patient not taking: Reported on 11/07/2016 12/20/15   Zadie Rhine, MD  oxyCODONE-acetaminophen (PERCOCET) 5-325 MG tablet Take 1 tablet by mouth every 6 (six) hours as needed.  Patient not taking: Reported on 11/07/2016 12/25/15   Bethann Berkshire, MD  risperiDONE (RISPERIDONE M-TAB) 1 MG disintegrating tablet Take 1 tablet (1 mg total) by mouth 2 (two) times daily. Patient not taking: Reported on 12/21/2015 09/08/15   Hillard Danker Joy, PA-C  silver sulfADIAZINE (SILVADENE) 1 % cream Apply 1 application topically daily. Patient not taking: Reported on 12/21/2015 08/14/15   Dione Booze, MD  tamsulosin (FLOMAX) 0.4 MG CAPS capsule Take 1 capsule (0.4 mg total)  by mouth daily. Patient not taking: Reported on 12/21/2015 09/08/15   Hillard Danker Joy, PA-C  traZODone (DESYREL) 50 MG tablet Take 1 tablet (50 mg total) by mouth at bedtime. 07/28/15   Earney Navy, NP    Family History History reviewed. No pertinent family history.  Social History Social History  Substance Use Topics  . Smoking status: Current Every Day Smoker    Packs/day: 0.50    Years: 10.00    Types: Cigarettes  . Smokeless tobacco: Never Used  . Alcohol use Yes     Comment: "Occasional"      Allergies   Haldol [haloperidol]   Review of Systems Review of Systems  HENT: Negative for ear discharge and nosebleeds.   Eyes: Negative for visual disturbance.  Gastrointestinal: Negative for abdominal pain, nausea and vomiting.  Genitourinary: Negative for hematuria.  Musculoskeletal: Positive for back pain and myalgias.  Neurological: Negative for dizziness, syncope and headaches.     Physical Exam Updated Vital Signs BP 120/87 (BP Location: Right Arm)   Pulse 86   Temp 98.2 F (36.8 C) (Oral)   Resp 16   Ht  (1.753 m)   Wt 106.1 kg   SpO2 98%   BMI 34.56 kg/m   Physical Exam  Constitutional: He is oriented to person, place, and time. He appears well-developed and well-nourished. No distress.  HENT:  Head: Normocephalic and atraumatic.  Right Ear: Tympanic membrane normal.  Left Ear: Tympanic membrane normal.  Nose: Nose normal.  Mouth/Throat: Uvula is midline.  Eyes: Conjunctivae are normal.  Neck: Normal range of motion. Neck supple.  Cardiovascular: Normal rate and regular rhythm.   Pulses:      Radial pulses are 2+ on the right side, and 2+ on the left side.  Pulmonary/Chest: Effort normal.  Abdominal: Soft. He exhibits no distension. There is no tenderness.  Musculoskeletal: He exhibits tenderness.  No cervical, thoracic or lumbar spinal tenderness. Tenderness to the left lumbar area.   Neurological: He is alert and oriented to person, place,  and time.  Grips equal. Reflexes are symmetric and normal.   Skin: Skin is warm and dry.  Psychiatric: He has a normal mood and affect.  Nursing note and vitals reviewed.    ED Treatments / Results  DIAGNOSTIC STUDIES:  Oxygen Saturation is 96% on RA, normal by my interpretation.    COORDINATION OF CARE:  10:41 PM Will XR back. Discussed treatment plan with pt at bedside and pt agreed to plan.  Labs (all labs ordered are listed, but only abnormal results are displayed) Labs Reviewed - No data to display   Radiology Dg Lumbar Spine Complete  Result Date: 11/09/2016 CLINICAL DATA:  56 year old male with trauma and back pain. EXAM: LUMBAR SPINE - COMPLETE 4+ VIEW COMPARISON:  None. FINDINGS: There is no acute fracture or dislocation. The vertebral body heights and disc spaces are maintained. There is straightening of the lumbar lordosis. Grade 1 L5-S1 retrolisthesis. Anterior osteophyte at L2-L3 noted. The soft tissues appear  unremarkable. IMPRESSION: No acute/traumatic lumbar spine pathology. Electronically Signed   By: Elgie Collard M.D.   On: 11/09/2016 23:42    Procedures Procedures (including critical care time)  Medications Ordered in ED Medications - No data to display   Initial Impression / Assessment and Plan / ED Course  I have reviewed the triage vital signs and the nursing notes.  Pertinent imaging results that were available during my care of the patient were reviewed by me and considered in my medical decision making (see chart for details).  Patient with back pain.  No new neuro deficits. Patient is ambulatory with a cane.  No loss of bowel or bladder control.  No concern for cauda equina.  No fever, night sweats, weight loss, h/o cancer, IVDA, no recent procedure to back. No urinary symptoms suggestive of UTI.  Supportive care and return precaution discussed. Appears safe for discharge at this time. Follow up as indicated in discharge paperwork.   Final  Clinical Impressions(s) / ED Diagnoses   Final diagnoses:  Contusion of lower back, initial encounter    New Prescriptions New Prescriptions   No medications on file  I personally performed the services described in this documentation, which was scribed in my presence. The recorded information has been reviewed and is accurate.     [id number] [place], NP 11/10/16 1610    Loren Racer, MD 11/14/16 [phone]

## 2016-11-09 NOTE — Discharge Instructions (Signed)
Take tylenol as needed for pain. Follow up with your doctor or return here as needed.

## 2016-11-10 NOTE — ED Notes (Signed)
See EDP secondary assessment.  

## 2016-11-12 ENCOUNTER — Emergency Department (HOSPITAL_COMMUNITY)
Admission: EM | Admit: 2016-11-12 | Discharge: 2016-11-12 | Disposition: A | Discharge status: Home / Self Care | Payer: Medicaid Other | Attending: Emergency Medicine | Admitting: Emergency Medicine

## 2016-11-12 ENCOUNTER — Encounter (HOSPITAL_COMMUNITY): Payer: Self-pay | Admitting: Emergency Medicine

## 2016-11-12 DIAGNOSIS — F17210 Nicotine dependence, cigarettes, uncomplicated: Secondary | ICD-10-CM | POA: Insufficient documentation

## 2016-11-12 DIAGNOSIS — E119 Type 2 diabetes mellitus without complications: Secondary | ICD-10-CM | POA: Diagnosis not present

## 2016-11-12 DIAGNOSIS — Z7984 Long term (current) use of oral hypoglycemic drugs: Secondary | ICD-10-CM | POA: Diagnosis not present

## 2016-11-12 DIAGNOSIS — R454 Irritability and anger: Secondary | ICD-10-CM

## 2016-11-12 DIAGNOSIS — I10 Essential (primary) hypertension: Secondary | ICD-10-CM | POA: Diagnosis not present

## 2016-11-12 DIAGNOSIS — F989 Unspecified behavioral and emotional disorders with onset usually occurring in childhood and adolescence: Secondary | ICD-10-CM | POA: Diagnosis present

## 2016-11-12 NOTE — ED Notes (Signed)
After several phone calls, RN reached the [facility]. Staff at the halfway house stated that he no longer had placement there and he was not allowed to come back.  This info was relayed to the patient.  The patient began yelling that he refused to leave that place because he "built that place! That place wasn't shit before me!"  Pt left the room and would not respond to RN or staff calling him.  Pt left AMA.
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
## 2016-11-12 NOTE — ED Notes (Signed)
Pt left a cane, several cards, drivers license, and epi pen in the room. RN placed belongings in a belonging bag, labeled with a pt label, and left bag at registration desk in case he comes back.

## 2016-11-12 NOTE — ED Notes (Signed)
After pt tells RN extensive story about his room at the halfway house, pt states that he is "only here because they [the facility] kicked him out."  Pt states, "call them and ask them if they're putting me out permanently. I'll go back there."

## 2016-11-13 ENCOUNTER — Encounter (HOSPITAL_COMMUNITY): Payer: Self-pay | Admitting: Emergency Medicine

## 2016-11-13 ENCOUNTER — Emergency Department (HOSPITAL_COMMUNITY)
Admission: EM | Admit: 2016-11-13 | Discharge: 2016-11-13 | Disposition: A | Discharge status: Home / Self Care | Payer: Medicaid Other | Attending: Dermatology | Admitting: Dermatology

## 2016-11-13 DIAGNOSIS — Z5321 Procedure and treatment not carried out due to patient leaving prior to being seen by health care provider: Secondary | ICD-10-CM | POA: Insufficient documentation

## 2016-11-13 DIAGNOSIS — R6883 Chills (without fever): Secondary | ICD-10-CM | POA: Insufficient documentation
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
## 2016-11-14 ENCOUNTER — Emergency Department (HOSPITAL_COMMUNITY)
Admission: EM | Admit: 2016-11-14 | Discharge: 2016-11-14 | Disposition: A | Discharge status: Home / Self Care | Payer: Medicaid Other | Attending: Emergency Medicine | Admitting: Emergency Medicine

## 2016-11-14 ENCOUNTER — Emergency Department (HOSPITAL_COMMUNITY): Payer: Medicaid Other

## 2016-11-14 ENCOUNTER — Encounter (HOSPITAL_COMMUNITY): Payer: Self-pay

## 2016-11-14 DIAGNOSIS — E119 Type 2 diabetes mellitus without complications: Secondary | ICD-10-CM | POA: Insufficient documentation

## 2016-11-14 DIAGNOSIS — W19XXXA Unspecified fall, initial encounter: Secondary | ICD-10-CM

## 2016-11-14 DIAGNOSIS — Y929 Unspecified place or not applicable: Secondary | ICD-10-CM | POA: Insufficient documentation

## 2016-11-14 DIAGNOSIS — S060X1A Concussion with loss of consciousness of 30 minutes or less, initial encounter: Secondary | ICD-10-CM | POA: Insufficient documentation

## 2016-11-14 DIAGNOSIS — Z79899 Other long term (current) drug therapy: Secondary | ICD-10-CM | POA: Diagnosis not present

## 2016-11-14 DIAGNOSIS — F17210 Nicotine dependence, cigarettes, uncomplicated: Secondary | ICD-10-CM | POA: Diagnosis not present

## 2016-11-14 DIAGNOSIS — I10 Essential (primary) hypertension: Secondary | ICD-10-CM | POA: Insufficient documentation

## 2016-11-14 DIAGNOSIS — S0083XA Contusion of other part of head, initial encounter: Secondary | ICD-10-CM | POA: Diagnosis not present

## 2016-11-14 DIAGNOSIS — Z7984 Long term (current) use of oral hypoglycemic drugs: Secondary | ICD-10-CM | POA: Insufficient documentation

## 2016-11-14 DIAGNOSIS — Y999 Unspecified external cause status: Secondary | ICD-10-CM | POA: Diagnosis not present

## 2016-11-14 DIAGNOSIS — W1839XA Other fall on same level, initial encounter: Secondary | ICD-10-CM | POA: Insufficient documentation

## 2016-11-14 DIAGNOSIS — Y9389 Activity, other specified: Secondary | ICD-10-CM | POA: Insufficient documentation

## 2016-11-14 DIAGNOSIS — S0990XA Unspecified injury of head, initial encounter: Secondary | ICD-10-CM

## 2016-11-14 LAB — CBC
HEMATOCRIT: 38.6 % — AB (ref 39.0–52.0)
Hemoglobin: 12.7 g/dL — ABNORMAL LOW (ref 13.0–17.0)
MCH: 27.1 pg (ref 26.0–34.0)
MCHC: 32.9 g/dL (ref 30.0–36.0)
MCV: 82.3 fL (ref 78.0–100.0)
Platelets: 276 10*3/uL (ref 150–400)
RBC: 4.69 MIL/uL (ref 4.22–5.81)
RDW: 15.1 % (ref 11.5–15.5)
WBC: 10.3 10*3/uL (ref 4.0–10.5)

## 2016-11-14 LAB — BASIC METABOLIC PANEL
Anion gap: 7 (ref 5–15)
BUN: 19 mg/dL (ref 6–20)
CHLORIDE: 108 mmol/L (ref 101–111)
CO2: 20 mmol/L — AB (ref 22–32)
Calcium: 8.5 mg/dL — ABNORMAL LOW (ref 8.9–10.3)
Creatinine, Ser: 1.05 mg/dL (ref 0.61–1.24)
GFR calc Af Amer: >60 mL/min (ref >60)
GFR calc non Af Amer: >60 mL/min (ref >60)
GLUCOSE: 148 mg/dL — AB (ref 65–99)
POTASSIUM: 3.6 mmol/L (ref 3.5–5.1)
Sodium: 135 mmol/L (ref 135–145)

## 2016-11-14 NOTE — ED Notes (Signed)
Pt verbalized understanding discharge instructions and denies any further needs or questions at this time. VS stable, ambulatory and steady gait.   

## 2016-11-14 NOTE — ED Notes (Signed)
Pt using a urinal at the bedside.  EMT's tried to assist pt up from the bed and the pt just laid there.  Once the EMT's had the pt positioned at the side of the bed pt stood up on his own and was able to stay standing while holding onto a walker.
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
## 2017-09-20 ENCOUNTER — Other Ambulatory Visit: Payer: Self-pay

## 2017-09-20 ENCOUNTER — Encounter (HOSPITAL_COMMUNITY): Payer: Self-pay | Admitting: Emergency Medicine

## 2017-09-20 ENCOUNTER — Emergency Department (HOSPITAL_COMMUNITY)
Admission: EM | Admit: 2017-09-20 | Discharge: 2017-09-20 | Disposition: A | Discharge status: Home / Self Care | Payer: Medicaid Other | Attending: Emergency Medicine | Admitting: Emergency Medicine

## 2017-09-20 DIAGNOSIS — F17210 Nicotine dependence, cigarettes, uncomplicated: Secondary | ICD-10-CM | POA: Insufficient documentation

## 2017-09-20 DIAGNOSIS — I10 Essential (primary) hypertension: Secondary | ICD-10-CM | POA: Insufficient documentation

## 2017-09-20 DIAGNOSIS — R739 Hyperglycemia, unspecified: Secondary | ICD-10-CM

## 2017-09-20 DIAGNOSIS — Z7984 Long term (current) use of oral hypoglycemic drugs: Secondary | ICD-10-CM | POA: Insufficient documentation

## 2017-09-20 DIAGNOSIS — E1165 Type 2 diabetes mellitus with hyperglycemia: Secondary | ICD-10-CM | POA: Insufficient documentation

## 2017-09-20 DIAGNOSIS — Z79899 Other long term (current) drug therapy: Secondary | ICD-10-CM | POA: Insufficient documentation

## 2017-09-20 DIAGNOSIS — N529 Male erectile dysfunction, unspecified: Secondary | ICD-10-CM | POA: Diagnosis not present

## 2017-09-20 LAB — CBC WITH DIFFERENTIAL/PLATELET
BASOS ABS: 0 10*3/uL (ref 0.0–0.1)
Basophils Relative: 0 %
EOS ABS: 0.3 10*3/uL (ref 0.0–0.7)
EOS PCT: 4 %
HCT: 43.6 % (ref 39.0–52.0)
HEMOGLOBIN: 14.5 g/dL (ref 13.0–17.0)
LYMPHS ABS: 2.1 10*3/uL (ref 0.7–4.0)
LYMPHS PCT: 25 %
MCH: 28.5 pg (ref 26.0–34.0)
MCHC: 33.3 g/dL (ref 30.0–36.0)
MCV: 85.7 fL (ref 78.0–100.0)
Monocytes Absolute: 1 10*3/uL (ref 0.1–1.0)
Monocytes Relative: 12 %
NEUTROS PCT: 59 %
Neutro Abs: 5 10*3/uL (ref 1.7–7.7)
PLATELETS: 257 10*3/uL (ref 150–400)
RBC: 5.09 MIL/uL (ref 4.22–5.81)
RDW: 14.4 % (ref 11.5–15.5)
WBC: 8.4 10*3/uL (ref 4.0–10.5)

## 2017-09-20 LAB — BASIC METABOLIC PANEL
Anion gap: 9 (ref 5–15)
BUN: 17 mg/dL (ref 6–20)
CHLORIDE: 103 mmol/L (ref 101–111)
CO2: 23 mmol/L (ref 22–32)
CREATININE: 1.22 mg/dL (ref 0.61–1.24)
Calcium: 9 mg/dL (ref 8.9–10.3)
Glucose, Bld: 286 mg/dL — ABNORMAL HIGH (ref 65–99)
POTASSIUM: 3.7 mmol/L (ref 3.5–5.1)
SODIUM: 135 mmol/L (ref 135–145)

## 2017-09-20 LAB — VALPROIC ACID LEVEL: VALPROIC ACID LVL: 54 ug/mL (ref 50.0–100.0)

## 2017-09-20 LAB — CBG MONITORING, ED: Glucose-Capillary: 295 mg/dL — ABNORMAL HIGH (ref 65–99)

## 2017-09-20 NOTE — ED Triage Notes (Signed)
[facility] tonight because he states he feels bad  Pt's CBG was 401  Pt takes metformin 500mg  every morning   Last dose was Tuesday morning

## 2017-09-20 NOTE — ED Provider Notes (Signed)
[facility]-EMERGENCY DEPT Provider Note   CSN: [id number] Arrival date & time: 09/20/17  0119     History   Chief Complaint Chief Complaint  Patient presents with  . Hyperglycemia    HPI Charles Leon is a 57 y.o. male.  Patient is a 57 year old male with history of Bipolar Disorder, Depression, DM, HTN, and spinal cord injury.  He presents today for evaluation of elevated blood sugar and impotence.  He tells me he was released from jail 2 weeks ago and has been unable to achieve an erection with a woman since that time.  He states while in prison he was able to masturbate and achieve erection without difficulty.  He stopped taking his metformin because he believed this may be the cause.  Since then his sugars have been running high and he has not been feeling well.  He denies any fevers or chills.   The history is provided by the patient.    Past Medical History:  Diagnosis Date  . Bipolar disorder (HCC)   . Depression   . Diabetes mellitus without complication (HCC)   . Hypertension   . Lumbar spinal cord injury [facility]) 2012    Patient Active Problem List   Diagnosis Date Noted  . 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 Bipolar 1 disorder, depressed (HCC) 06/06/2014    Past Surgical History:  Procedure Laterality Date  . BACK SURGERY    . EYE SURGERY         Home Medications    Prior to Admission medications   Medication Sig Start Date End Date Taking? Authorizing Provider  cyclobenzaprine (FLEXERIL) 10 MG tablet Take 1 tablet (10 mg total) by mouth 2 (two) times daily as needed for muscle spasms. Patient not taking: Reported on 12/21/2015 07/29/15   Elpidio Anis, PA-C  ibuprofen (ADVIL,MOTRIN) 800 MG tablet Take 1 tablet (800 mg total) by mouth 3 (three) times daily. Patient not taking: Reported on 11/07/2016 08/14/15   Dione Booze, MD  lisinopril (ZESTRIL) 10 MG tablet Take 1 tablet (10 mg total) by  mouth daily. Patient not taking: Reported on 11/07/2016 12/20/15   Zadie Rhine, MD  meloxicam (MOBIC) 7.5 MG tablet Take 2 tablets (15 mg total) by mouth daily. Patient not taking: Reported on 11/07/2016 12/19/15   Jerre Simon, PA  metFORMIN (GLUCOPHAGE) 500 MG tablet Take 1 tablet (500 mg total) by mouth 2 (two) times daily with a meal. Patient not taking: Reported on 11/07/2016 12/20/15   Zadie Rhine, MD  oxyCODONE-acetaminophen (PERCOCET) 5-325 MG tablet Take 1 tablet by mouth every 6 (six) hours as needed. Patient not taking: Reported on 11/07/2016 12/25/15   Bethann Berkshire, MD  risperiDONE (RISPERIDONE M-TAB) 1 MG disintegrating tablet Take 1 tablet (1 mg total) by mouth 2 (two) times daily. Patient not taking: Reported on 12/21/2015 09/08/15   Harolyn Rutherford C, PA-C  silver sulfADIAZINE (SILVADENE) 1 % cream Apply 1 application topically daily. Patient not taking: Reported on 12/21/2015 08/14/15   Dione Booze, MD  tamsulosin (FLOMAX) 0.4 MG CAPS capsule Take 1 capsule (0.4 mg total) by mouth daily. Patient not taking: Reported on 12/21/2015 09/08/15   Anselm Pancoast, PA-C  traZODone (DESYREL) 50 MG tablet Take 1 tablet (50 mg total) by mouth at bedtime. 07/28/15   Earney Navy, NP    Family History History reviewed. No pertinent family history.  Social History Social History   Tobacco Use  .  Smoking status: Current Every Day Smoker    Packs/day: 0.50    Years: 10.00    Pack years: 5.00    Types: Cigarettes  . Smokeless tobacco: Never Used  Substance Use Topics  . Alcohol use: Yes    Comment: "Occasional"   . 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    Physical Exam Updated Vital Signs BP (!) 150/94 (BP Location: Right Arm)   Pulse (!) 105   Temp (!) 97.5 F (36.4 C) (Oral)   Resp 16   SpO2 96%   Physical Exam    Constitutional: He is oriented to person, place, and time. He appears well-developed and well-nourished. No distress.  HENT:  Head: Normocephalic and atraumatic.  Mouth/Throat: Oropharynx is clear and moist.  Neck: Normal range of motion. Neck supple.  Cardiovascular: Normal rate and regular rhythm. Exam reveals no friction rub.  No murmur heard. Pulmonary/Chest: Effort normal and breath sounds normal. No respiratory distress. He has no wheezes. He has no rales.  Abdominal: Soft. Bowel sounds are normal. He exhibits no distension. There is no tenderness.  Musculoskeletal: Normal range of motion. He exhibits no edema.  Neurological: He is alert and oriented to person, place, and time. Coordination normal.  Skin: Skin is warm and dry. He is not diaphoretic.  Nursing note and vitals reviewed.    ED Treatments / Results  Labs (all labs ordered are listed, but only abnormal results are displayed) Labs Reviewed  CBG MONITORING, ED - Abnormal; Notable for the following components:      Result Value   Glucose-Capillary 295 (*)    All other components within normal limits  BASIC METABOLIC PANEL  CBC WITH DIFFERENTIAL/PLATELET  VALPROIC ACID LEVEL    EKG  EKG Interpretation None       Radiology No results found.  Procedures Procedures (including critical care time)  Medications Ordered in ED Medications - No data to display   Initial Impression / Assessment and Plan / ED Course  I have reviewed the triage vital signs and the nursing notes.  Pertinent labs & imaging results that were available during my care of the patient were reviewed by me and considered in my medical decision making (see chart for details).  I am uncertain as to the exact etiology of this patient's symptoms, however nothing today appears emergent.  His laboratory studies are unremarkable with the exception of a sugar of 286.  I suspect this is high because he stopped taking his metformin thinking this  was the cause of his impotence.  I have advised him to resume taking this medication and follow-up with a primary doctor if his issues do not resolve spontaneously.  Final Clinical Impressions(s) / ED Diagnoses   Final diagnoses:  None    ED Discharge Orders    None       Geoffery Lyonselo, Adriella Essex, MD 09/20/17 0600

## 2017-09-20 NOTE — Discharge Instructions (Signed)
Resume taking your metformin as previously prescribed.  Follow-up with your primary doctor if symptoms are not resolving in the next 1-2 weeks.

## 2017-09-21 ENCOUNTER — Other Ambulatory Visit: Payer: Self-pay

## 2017-09-21 ENCOUNTER — Encounter (HOSPITAL_COMMUNITY): Payer: Self-pay

## 2017-09-21 DIAGNOSIS — M545 Low back pain: Secondary | ICD-10-CM | POA: Diagnosis present

## 2017-09-21 DIAGNOSIS — I10 Essential (primary) hypertension: Secondary | ICD-10-CM | POA: Insufficient documentation

## 2017-09-21 DIAGNOSIS — Z7984 Long term (current) use of oral hypoglycemic drugs: Secondary | ICD-10-CM | POA: Insufficient documentation

## 2017-09-21 DIAGNOSIS — E119 Type 2 diabetes mellitus without complications: Secondary | ICD-10-CM | POA: Diagnosis not present

## 2017-09-21 DIAGNOSIS — Z79899 Other long term (current) drug therapy: Secondary | ICD-10-CM | POA: Diagnosis not present

## 2017-09-21 DIAGNOSIS — F17210 Nicotine dependence, cigarettes, uncomplicated: Secondary | ICD-10-CM | POA: Diagnosis not present

## 2017-09-21 LAB — CBG MONITORING, ED: Glucose-Capillary: 285 mg/dL — ABNORMAL HIGH (ref 65–99)
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
## 2017-09-22 ENCOUNTER — Emergency Department (HOSPITAL_COMMUNITY)
Admission: EM | Admit: 2017-09-22 | Discharge: 2017-09-22 | Discharge status: Against Medical Advice | Payer: Medicaid Other | Attending: Emergency Medicine | Admitting: Emergency Medicine

## 2017-09-22 ENCOUNTER — Other Ambulatory Visit: Payer: Self-pay

## 2017-09-22 ENCOUNTER — Emergency Department (HOSPITAL_COMMUNITY)
Admission: EM | Admit: 2017-09-22 | Discharge: 2017-09-22 | Disposition: A | Discharge status: Home / Self Care | Payer: Medicaid Other | Source: Home / Self Care | Attending: Emergency Medicine | Admitting: Emergency Medicine

## 2017-09-22 ENCOUNTER — Encounter (HOSPITAL_COMMUNITY): Payer: Self-pay

## 2017-09-22 DIAGNOSIS — G8929 Other chronic pain: Secondary | ICD-10-CM

## 2017-09-22 DIAGNOSIS — M545 Low back pain, unspecified: Secondary | ICD-10-CM

## 2017-09-22 MED ORDER — CYCLOBENZAPRINE HCL 10 MG PO TABS
10.0000 mg | ORAL_TABLET | Freq: Once | ORAL | Status: AC
  Administered 2017-09-22: 10 mg via ORAL
  Filled 2017-09-22: qty 1

## 2017-09-22 MED ORDER — KETOROLAC TROMETHAMINE 60 MG/2ML IM SOLN
60.0000 mg | Freq: Once | INTRAMUSCULAR | Status: AC
  Administered 2017-09-22: 60 mg via INTRAMUSCULAR
  Filled 2017-09-22: qty 2

## 2017-09-22 MED ORDER — IBUPROFEN 600 MG PO TABS: 600.0000 mg | ORAL_TABLET | Freq: Four times a day (QID) | ORAL | 0 refills | Status: DC | PRN

## 2017-09-22 MED ORDER — CYCLOBENZAPRINE HCL 10 MG PO TABS: 10.0000 mg | ORAL_TABLET | Freq: Two times a day (BID) | ORAL | 0 refills | Status: DC | PRN
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
## 2017-09-22 NOTE — Discharge Instructions (Signed)
Motrin 600 mg every 6 hours as needed for pain.  Flexeril as prescribed as needed for pain not relieved with Motrin.  Follow-up with your primary doctor if not improving in the next week.
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
## 2017-09-22 NOTE — ED Notes (Signed)
Patient panhandling in the waiting room for money with other patients and visitors.  Patient was asked to leave by security.
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
## 2017-09-23 ENCOUNTER — Other Ambulatory Visit: Payer: Self-pay

## 2017-09-23 ENCOUNTER — Encounter (HOSPITAL_COMMUNITY): Payer: Self-pay

## 2017-09-23 DIAGNOSIS — E119 Type 2 diabetes mellitus without complications: Secondary | ICD-10-CM | POA: Insufficient documentation

## 2017-09-23 DIAGNOSIS — M542 Cervicalgia: Secondary | ICD-10-CM | POA: Diagnosis present

## 2017-09-23 DIAGNOSIS — F329 Major depressive disorder, single episode, unspecified: Secondary | ICD-10-CM | POA: Insufficient documentation

## 2017-09-23 DIAGNOSIS — Z79899 Other long term (current) drug therapy: Secondary | ICD-10-CM | POA: Diagnosis not present

## 2017-09-23 DIAGNOSIS — F319 Bipolar disorder, unspecified: Secondary | ICD-10-CM | POA: Diagnosis not present

## 2017-09-23 DIAGNOSIS — Z7984 Long term (current) use of oral hypoglycemic drugs: Secondary | ICD-10-CM | POA: Diagnosis not present

## 2017-09-23 DIAGNOSIS — M549 Dorsalgia, unspecified: Secondary | ICD-10-CM | POA: Insufficient documentation

## 2017-09-23 DIAGNOSIS — F17210 Nicotine dependence, cigarettes, uncomplicated: Secondary | ICD-10-CM | POA: Diagnosis not present

## 2017-09-23 DIAGNOSIS — G8929 Other chronic pain: Secondary | ICD-10-CM | POA: Diagnosis not present

## 2017-09-23 DIAGNOSIS — I10 Essential (primary) hypertension: Secondary | ICD-10-CM | POA: Insufficient documentation

## 2017-09-23 NOTE — ED Triage Notes (Signed)
Right lower back pain off and on for years worse since about 12 hours worse with walking no bowel or bladder problems.

## 2017-09-24 ENCOUNTER — Encounter (HOSPITAL_COMMUNITY): Payer: Self-pay

## 2017-09-24 ENCOUNTER — Emergency Department (HOSPITAL_COMMUNITY)
Admission: EM | Admit: 2017-09-24 | Discharge: 2017-09-24 | Disposition: A | Discharge status: Home / Self Care | Payer: Medicaid Other | Attending: Emergency Medicine | Admitting: Emergency Medicine

## 2017-09-24 ENCOUNTER — Emergency Department (HOSPITAL_COMMUNITY): Payer: Medicaid Other

## 2017-09-24 ENCOUNTER — Emergency Department (HOSPITAL_COMMUNITY)
Admission: EM | Admit: 2017-09-24 | Discharge: 2017-09-25 | Disposition: A | Discharge status: Home / Self Care | Payer: Medicaid Other | Source: Home / Self Care | Attending: Emergency Medicine | Admitting: Emergency Medicine

## 2017-09-24 DIAGNOSIS — G8929 Other chronic pain: Secondary | ICD-10-CM

## 2017-09-24 DIAGNOSIS — W19XXXA Unspecified fall, initial encounter: Secondary | ICD-10-CM

## 2017-09-24 MED ORDER — DIAZEPAM 5 MG PO TABS
5.0000 mg | ORAL_TABLET | Freq: Once | ORAL | Status: AC
  Administered 2017-09-24: 5 mg via ORAL
  Filled 2017-09-24: qty 1

## 2017-09-24 MED ORDER — OXYCODONE-ACETAMINOPHEN 5-325 MG PO TABS
2.0000 | ORAL_TABLET | Freq: Once | ORAL | Status: AC
  Administered 2017-09-24: 2 via ORAL
  Filled 2017-09-24: qty 2

## 2017-09-24 NOTE — ED Notes (Signed)
No answer from lobby x 2 

## 2017-09-24 NOTE — ED Notes (Signed)
Pt called from the lobby with no response x3 

## 2017-09-24 NOTE — ED Notes (Signed)
PT state he have been outside waiting to be seen

## 2017-09-24 NOTE — ED Notes (Signed)
Pt ambulatory in lobby.  
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
## 2017-09-24 NOTE — ED Notes (Signed)
Pt ambulatory to room and throughout the hallway without walker.

## 2017-09-24 NOTE — ED Provider Notes (Signed)
[facility]-EMERGENCY DEPT Provider Note   CSN: [id number] Arrival date & time: 09/24/17  1706     History   Chief Complaint Chief Complaint  Patient presents with  . Fall    HPI   Blood pressure 112/67, pulse 82, temperature 97.7 F (36.5 C), temperature source Oral, resp. rate 18, SpO2 98 %.  Charles Leon is a 57 y.o. male complaining of neck and headache status post fall onto concrete early this morning.  Patient walks with a walker and states that he tripped because his shoelaces were untied, he denies any alcohol use today.  He denies any change in vision, nausea vomiting, numbness, weakness, chest pain shortness of breath, abdominal pain.  He is ambulatory at his baseline.  Pain is severe but alleviated with triage initiated Percocet.   Past Medical History:  Diagnosis Date  . Bipolar disorder (HCC)   . Depression   . Diabetes mellitus without complication (HCC)   . Hypertension   . Lumbar spinal cord injury [facility]) 2012    Patient Active Problem List   Diagnosis Date Noted  . 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 Bipolar 1 disorder, depressed (HCC) 06/06/2014    Past Surgical History:  Procedure Laterality Date  . BACK SURGERY    . EYE SURGERY         Home Medications    Prior to Admission medications   Medication Sig Start Date End Date Taking? Authorizing Provider  cyclobenzaprine (FLEXERIL) 10 MG tablet Take 1 tablet (10 mg total) by mouth 2 (two) times daily as needed for muscle spasms. 09/22/17   Geoffery Lyons, MD  ibuprofen (ADVIL,MOTRIN) 600 MG tablet Take 1 tablet (600 mg total) by mouth every 6 (six) hours as needed. 09/22/17   Geoffery Lyons, MD  lisinopril (ZESTRIL) 10 MG tablet Take 1 tablet (10 mg total) by mouth daily. Patient not taking: Reported on 11/07/2016 12/20/15   Zadie Rhine, MD  meloxicam (MOBIC) 7.5 MG tablet Take 2 tablets (15 mg total) by mouth daily. Patient not taking:  Reported on 11/07/2016 12/19/15   Jerre Simon, PA  metFORMIN (GLUCOPHAGE) 500 MG tablet Take 1 tablet (500 mg total) by mouth 2 (two) times daily with a meal. Patient not taking: Reported on 11/07/2016 12/20/15   Zadie Rhine, MD  oxyCODONE-acetaminophen (PERCOCET) 5-325 MG tablet Take 1 tablet by mouth every 6 (six) hours as needed. Patient not taking: Reported on 11/07/2016 12/25/15   Bethann Berkshire, MD  risperiDONE (RISPERIDONE M-TAB) 1 MG disintegrating tablet Take 1 tablet (1 mg total) by mouth 2 (two) times daily. Patient not taking: Reported on 12/21/2015 09/08/15   Harolyn Rutherford C, PA-C  silver sulfADIAZINE (SILVADENE) 1 % cream Apply 1 application topically daily. Patient not taking: Reported on 12/21/2015 08/14/15   Dione Booze, MD  tamsulosin (FLOMAX) 0.4 MG CAPS capsule Take 1 capsule (0.4 mg total) by mouth daily. Patient not taking: Reported on 12/21/2015 09/08/15   Anselm Pancoast, PA-C  traZODone (DESYREL) 50 MG tablet Take 1 tablet (50 mg total) by mouth at bedtime. 07/28/15   Earney Navy, NP    Family History History reviewed. No pertinent family history.  Social History Social History   Tobacco Use  . Smoking status: Current Every Day Smoker    Packs/day: 0.50    Years: 10.00    Pack years: 5.00    Types: Cigarettes  . Smokeless tobacco: Never Used  Substance Use Topics  .  Alcohol use: Yes    Comment: "Occasional"   . 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   Physical Exam Updated Vital Signs BP 112/67 (BP Location: Left Arm)   Pulse 82   Temp 97.7 F (36.5 C) (Oral)   Resp 18   SpO2 98%   Physical Exam  Constitutional: He is oriented to person, place, and time. He appears well-developed and well-nourished. No distress.  HENT:  Head: Normocephalic and  atraumatic.  Mouth/Throat: Oropharynx is clear and moist.  No abrasions or contusions.   No hemotympanum, battle signs or raccoon's eyes  No crepitance or tenderness to palpation along the orbital rim.  EOMI intact with no pain or diplopia  No abnormal otorrhea or rhinorrhea. Nasal septum midline.  No intraoral trauma.  Eyes: Conjunctivae and EOM are normal. Pupils are equal, round, and reactive to light.  Neck: Normal range of motion. Neck supple.  No midline C-spine  tenderness to palpation or step-offs appreciated. Patient has full range of motion without pain.  Grip/bicep/tricep strength 5/5 bilaterally. Able to differentiate between pinprick and light touch bilaterally     Cardiovascular: Normal rate, regular rhythm and intact distal pulses.  Pulmonary/Chest: Effort normal and breath sounds normal. No respiratory distress. He has no wheezes. He has no rales. He exhibits no tenderness.  No TTP or crepitance  Abdominal: Soft. Bowel sounds are normal. He exhibits no distension and no mass. There is no tenderness. There is no rebound and no guarding.  No TTP  Musculoskeletal: Normal range of motion. He exhibits no edema or tenderness.  Pelvis stable, No TTP of greater trochanter bilaterally  No tenderness to percussion of Lumbar/Thoracic spinous processes. No step-offs. No paraspinal muscular TTP  Neurological: He is alert and oriented to person, place, and time.  Strength 5/5 x4 extremities   Distal sensation intact  Skin: Skin is warm. He is not diaphoretic.  Psychiatric: He has a normal mood and affect.  Nursing note and vitals reviewed.    ED Treatments / Results  Labs (all labs ordered are listed, but only abnormal results are displayed) Labs Reviewed - No data to display  EKG  EKG Interpretation None       Radiology Ct Head Wo Contrast  Result Date: 09/24/2017 CLINICAL DATA:  57 y/o  M; multiple falls and head trauma. EXAM: CT HEAD WITHOUT CONTRAST CT  CERVICAL SPINE WITHOUT CONTRAST TECHNIQUE: Multidetector CT imaging of the head and cervical spine was performed following the standard protocol without intravenous contrast. Multiplanar CT image reconstructions of the cervical spine were also generated. COMPARISON:  11/14/2016 CT head.  12/25/2015 CT cervical spine. FINDINGS: CT HEAD FINDINGS Brain: No evidence of acute infarction, hemorrhage, hydrocephalus, extra-axial collection or mass lesion/mass effect. Nonspecific foci of hypoattenuation in subcortical and periventricular white matter are compatible with mild chronic microvascular ischemic changes and there is mild brain parenchymal volume loss. Vascular: No hyperdense vessel or unexpected calcification. Skull: Normal. Negative for fracture or focal lesion. Sinuses/Orbits: Ethmoid sinus disease and small right maxillary sinus fluid level. Otherwise negative. Other: None. CT CERVICAL SPINE FINDINGS Alignment: Straightening of cervical lordosis.  No listhesis. Skull base and vertebrae: No acute fracture identified. C3-C5 anterior and interbody fusion. Fusion hardware is intact and there  is no periprosthetic lucency or fracture identified. Additionally, there is fusion of C2-C5 anterior interspinous ligament. Soft tissues and spinal canal: No prevertebral fluid or swelling. No visible canal hematoma. Disc levels: Ossific ridging results in mild to moderate bony canal stenosis at C4 and C5. Upper chest: Negative. Other: Negative. IMPRESSION: 1. No acute calvarial fracture or intracranial abnormality. 2. No acute fracture or dislocation of cervical spine. 3. Mild chronic microvascular ischemic changes and mild parenchymal volume loss of the brain. 4. Ethmoid sinus disease and small right maxillary fluid level which may represent acute sinusitis or be related to trauma. 5. C3-C5 anterior fusion. Mild to moderate bony canal stenosis at C4 and C5 from posterior ossific ridging. Electronically Signed   By: Mitzi Hansen M.D.   On: 09/24/2017 23:28   Ct Cervical Spine Wo Contrast  Result Date: 09/24/2017 CLINICAL DATA:  57 y/o  M; multiple falls and head trauma. EXAM: CT HEAD WITHOUT CONTRAST CT CERVICAL SPINE WITHOUT CONTRAST TECHNIQUE: Multidetector CT imaging of the head and cervical spine was performed following the standard protocol without intravenous contrast. Multiplanar CT image reconstructions of the cervical spine were also generated. COMPARISON:  11/14/2016 CT head.  12/25/2015 CT cervical spine. FINDINGS: CT HEAD FINDINGS Brain: No evidence of acute infarction, hemorrhage, hydrocephalus, extra-axial collection or mass lesion/mass effect. Nonspecific foci of hypoattenuation in subcortical and periventricular white matter are compatible with mild chronic microvascular ischemic changes and there is mild brain parenchymal volume loss. Vascular: No hyperdense vessel or unexpected calcification. Skull: Normal. Negative for fracture or focal lesion. Sinuses/Orbits: Ethmoid sinus disease and small right maxillary sinus fluid level. Otherwise negative. Other: None. CT CERVICAL SPINE FINDINGS Alignment: Straightening of cervical lordosis.  No listhesis. Skull base and vertebrae: No acute fracture identified. C3-C5 anterior and interbody fusion. Fusion hardware is intact and there is no periprosthetic lucency or fracture identified. Additionally, there is fusion of C2-C5 anterior interspinous ligament. Soft tissues and spinal canal: No prevertebral fluid or swelling. No visible canal hematoma. Disc levels: Ossific ridging results in mild to moderate bony canal stenosis at C4 and C5. Upper chest: Negative. Other: Negative. IMPRESSION: 1. No acute calvarial fracture or intracranial abnormality. 2. No acute fracture or dislocation of cervical spine. 3. Mild chronic microvascular ischemic changes and mild parenchymal volume loss of the brain. 4. Ethmoid sinus disease and small right maxillary fluid level which  may represent acute sinusitis or be related to trauma. 5. C3-C5 anterior fusion. Mild to moderate bony canal stenosis at C4 and C5 from posterior ossific ridging. Electronically Signed   By: Mitzi Hansen M.D.   On: 09/24/2017 23:28    Procedures Procedures (including critical care time)  Medications Ordered in ED Medications - No data to display   Initial Impression / Assessment and Plan / ED Course  I have reviewed the triage vital signs and the nursing notes.  Pertinent labs & imaging results that were available during my care of the patient were reviewed by me and considered in my medical decision making (see chart for details).     Vitals:   09/24/17 1717  BP: 112/67  Pulse: 82  Resp: 18  Temp: 97.7 F (36.5 C)  TempSrc: Oral  SpO2: 98%     Charles Leon is 57 y.o. male presenting with headache and neck pain status post fall from standing earlier in the day.  Physical exam reassuring.  Patient denies EtOH to me but apparently he admitted this to EMS.  Very sleepy, this may  be secondary to Percocet but will obtain head and cervical spine CT.   CT negative, somnolent but able to meaningfully discussed the results, ambulatory and appropriate for discharge.  Evaluation does not show pathology that would require ongoing emergent intervention or inpatient treatment. Pt is hemodynamically stable and mentating appropriately. Discussed findings and plan with patient/guardian, who agrees with care plan. All questions answered. Return precautions discussed and outpatient follow up given.      Final Clinical Impressions(s) / ED Diagnoses   Final diagnoses:  Fall, initial encounter    ED Discharge Orders    None       Mearl Harewood, Mardella Layman 09/25/17 0001    Pricilla Loveless, MD 09/25/17 1712

## 2017-09-24 NOTE — ED Notes (Signed)
Pt called from the lobby with no response x2 

## 2017-09-24 NOTE — ED Notes (Signed)
Pt ambulatory,outside to smoke

## 2017-09-24 NOTE — ED Notes (Signed)
Pt called x1 for room. No answer.  

## 2017-09-24 NOTE — ED Notes (Signed)
Bed: WTR5 Expected date:  Expected time:  Means of arrival:  Comments: 

## 2017-09-24 NOTE — ED Notes (Signed)
No answer from  Lobby x3

## 2017-09-24 NOTE — ED Notes (Signed)
Pt called from the lobby with no repsonse 

## 2017-09-24 NOTE — ED Triage Notes (Signed)
Pt BIB [facility] after falling d/t tripping on untied shoe laces. Admits to ETOH. PT sleeping throughout triage. Ambulatory in triage.

## 2017-09-25 NOTE — Discharge Instructions (Signed)
Please follow with your primary care doctor in the next 2 days for a check-up. They must obtain records for further management.  ° °Do not hesitate to return to the Emergency Department for any new, worsening or concerning symptoms.  ° °

## 2017-09-27 ENCOUNTER — Other Ambulatory Visit: Payer: Self-pay

## 2017-09-27 ENCOUNTER — Emergency Department (HOSPITAL_COMMUNITY)
Admission: EM | Admit: 2017-09-27 | Discharge: 2017-09-28 | Discharge status: Against Medical Advice | Payer: Medicaid Other | Attending: Emergency Medicine | Admitting: Emergency Medicine

## 2017-09-27 ENCOUNTER — Encounter (HOSPITAL_COMMUNITY): Payer: Self-pay | Admitting: Emergency Medicine

## 2017-09-27 DIAGNOSIS — M549 Dorsalgia, unspecified: Secondary | ICD-10-CM

## 2017-09-27 NOTE — ED Notes (Signed)
Pt called for help stating he needs to urinate but can't walk and needs a urinal. Urinal was given to pt as well as a blanket to cover up since he was in hallway bed. At that time pt stated he needs help holding the urinal. This nurse attempted to help pt however, pt had a fully erected penis, he was moaning and asked me to push on his penis. Pt was told to hold urinal himself, at which time he was observed taking the book form under his covers and getting the picture of naked woman out of the book and started moaning loudly and thrusting his hips. He attempted to hold my hand and place it on his penis when I moved away and told him his behavior will not be tolerated. Press photographerCharge nurse and security notified.

## 2017-09-27 NOTE — ED Notes (Signed)
This Clinical research associatewriter took EMS report on patient. Pt appears to be falling out of EMS stretcher with unsteady gait. [facility]; staff in assignment notified VS are needed then triage complete.

## 2017-09-27 NOTE — ED Triage Notes (Signed)
Per EMS pt complaint of chronic back pain; uses wheelchair.

## 2017-09-27 NOTE — ED Provider Notes (Signed)
1315: I went to evaluate patient. He was yelling at RN and cursing, calling him racist and threatening to "cut" him. Pt states he does not want any one putting his hands on him or help him walk out. Pt observed to get up out of bed, gather his things and walk out independently. Escorted by police.    Liberty HandyGibbons, Kyani Simkin J, PA-C 09/27/17 1326    Nira Connardama, Pedro Eduardo, MD 09/30/17 [phone]

## 2017-09-27 NOTE — ED Notes (Signed)
He was seen to be masturbating while a nurse [facility](Ajah Khalid) was holding a urinal. Upon being confronted with this he becomes loud and profane, calling me names and accusing me of being "KKK". Police and security notified and pt. Is escorted out of our facility. He states to me in the presence of a [place] police officer "I will cut your dick off, M'fer!", etc.

## 2017-10-22 ENCOUNTER — Emergency Department (HOSPITAL_COMMUNITY): Payer: Medicaid Other

## 2017-10-22 ENCOUNTER — Inpatient Hospital Stay (HOSPITAL_COMMUNITY)
Admission: EM | Admit: 2017-10-22 | Discharge: 2017-10-25 | DRG: 897 | Disposition: A | Discharge status: Home / Self Care | Payer: Medicaid Other | Attending: Internal Medicine | Admitting: Internal Medicine

## 2017-10-22 ENCOUNTER — Encounter (HOSPITAL_COMMUNITY): Payer: Self-pay | Admitting: Emergency Medicine

## 2017-10-22 DIAGNOSIS — I10 Essential (primary) hypertension: Secondary | ICD-10-CM | POA: Diagnosis present

## 2017-10-22 DIAGNOSIS — F1414 Cocaine abuse with cocaine-induced mood disorder: Principal | ICD-10-CM | POA: Diagnosis present

## 2017-10-22 DIAGNOSIS — Z590 Homelessness: Secondary | ICD-10-CM

## 2017-10-22 DIAGNOSIS — Z79899 Other long term (current) drug therapy: Secondary | ICD-10-CM

## 2017-10-22 DIAGNOSIS — F1410 Cocaine abuse, uncomplicated: Secondary | ICD-10-CM

## 2017-10-22 DIAGNOSIS — N179 Acute kidney failure, unspecified: Secondary | ICD-10-CM | POA: Diagnosis present

## 2017-10-22 DIAGNOSIS — R739 Hyperglycemia, unspecified: Secondary | ICD-10-CM

## 2017-10-22 DIAGNOSIS — R4182 Altered mental status, unspecified: Secondary | ICD-10-CM | POA: Diagnosis present

## 2017-10-22 DIAGNOSIS — E119 Type 2 diabetes mellitus without complications: Secondary | ICD-10-CM | POA: Diagnosis present

## 2017-10-22 DIAGNOSIS — Z888 Allergy status to other drugs, medicaments and biological substances status: Secondary | ICD-10-CM

## 2017-10-22 DIAGNOSIS — F17210 Nicotine dependence, cigarettes, uncomplicated: Secondary | ICD-10-CM | POA: Diagnosis present

## 2017-10-22 DIAGNOSIS — W010XXA Fall on same level from slipping, tripping and stumbling without subsequent striking against object, initial encounter: Secondary | ICD-10-CM | POA: Diagnosis present

## 2017-10-22 DIAGNOSIS — F319 Bipolar disorder, unspecified: Secondary | ICD-10-CM | POA: Diagnosis present

## 2017-10-22 DIAGNOSIS — M542 Cervicalgia: Secondary | ICD-10-CM | POA: Diagnosis present

## 2017-10-22 DIAGNOSIS — F3130 Bipolar disorder, current episode depressed, mild or moderate severity, unspecified: Secondary | ICD-10-CM | POA: Diagnosis present

## 2017-10-22 DIAGNOSIS — E860 Dehydration: Secondary | ICD-10-CM | POA: Diagnosis present

## 2017-10-22 HISTORY — DX: Anxiety disorder, unspecified: F41.9

## 2017-10-22 HISTORY — DX: Type 2 diabetes mellitus without complications: E11.9

## 2017-10-22 LAB — CBC WITH DIFFERENTIAL/PLATELET
BASOS ABS: 0 10*3/uL (ref 0.0–0.1)
Basophils Relative: 0 %
Eosinophils Absolute: 0.4 10*3/uL (ref 0.0–0.7)
Eosinophils Relative: 5 %
HEMATOCRIT: 41.6 % (ref 39.0–52.0)
Hemoglobin: 13.6 g/dL (ref 13.0–17.0)
LYMPHS ABS: 2.1 10*3/uL (ref 0.7–4.0)
LYMPHS PCT: 25 %
MCH: 27.9 pg (ref 26.0–34.0)
MCHC: 32.7 g/dL (ref 30.0–36.0)
MCV: 85.2 fL (ref 78.0–100.0)
Monocytes Absolute: 0.8 10*3/uL (ref 0.1–1.0)
Monocytes Relative: 9 %
NEUTROS ABS: 5.4 10*3/uL (ref 1.7–7.7)
Neutrophils Relative %: 61 %
Platelets: 299 10*3/uL (ref 150–400)
RBC: 4.88 MIL/uL (ref 4.22–5.81)
RDW: 14.2 % (ref 11.5–15.5)
WBC: 8.7 10*3/uL (ref 4.0–10.5)

## 2017-10-22 LAB — COMPREHENSIVE METABOLIC PANEL
ALBUMIN: 3.3 g/dL — AB (ref 3.5–5.0)
ALT: 37 U/L (ref 17–63)
ANION GAP: 10 (ref 5–15)
AST: 61 U/L — ABNORMAL HIGH (ref 15–41)
Alkaline Phosphatase: 69 U/L (ref 38–126)
BILIRUBIN TOTAL: 0.5 mg/dL (ref 0.3–1.2)
BUN: 18 mg/dL (ref 6–20)
CHLORIDE: 108 mmol/L (ref 101–111)
CO2: 20 mmol/L — ABNORMAL LOW (ref 22–32)
Calcium: 9.2 mg/dL (ref 8.9–10.3)
Creatinine, Ser: 1.25 mg/dL — ABNORMAL HIGH (ref 0.61–1.24)
GFR calc Af Amer: >60 mL/min (ref >60)
GFR calc non Af Amer: >60 mL/min (ref >60)
Glucose, Bld: 127 mg/dL — ABNORMAL HIGH (ref 65–99)
POTASSIUM: 3.9 mmol/L (ref 3.5–5.1)
SODIUM: 138 mmol/L (ref 135–145)
TOTAL PROTEIN: 6.7 g/dL (ref 6.5–8.1)

## 2017-10-22 LAB — ACETAMINOPHEN LEVEL

## 2017-10-22 LAB — ETHANOL: Alcohol, Ethyl (B): <10 mg/dL (ref <10)

## 2017-10-22 LAB — AMMONIA: AMMONIA: 22 umol/L (ref 9–35)

## 2017-10-22 LAB — CBG MONITORING, ED: GLUCOSE-CAPILLARY: 116 mg/dL — AB (ref 65–99)

## 2017-10-22 LAB — SALICYLATE LEVEL: Salicylate Lvl: <7 mg/dL (ref 2.8–30.0)

## 2017-10-22 MED ORDER — NALOXONE HCL 0.4 MG/ML IJ SOLN
0.2000 mg | Freq: Once | INTRAMUSCULAR | Status: AC
  Administered 2017-10-22: 0.2 mg via INTRAVENOUS
  Filled 2017-10-22: qty 1

## 2017-10-22 MED ORDER — THIAMINE HCL 100 MG/ML IJ SOLN
100.0000 mg | Freq: Once | INTRAMUSCULAR | Status: AC
Start: 2017-10-22 — End: 2017-10-22
  Administered 2017-10-22: 100 mg via INTRAVENOUS
  Filled 2017-10-22: qty 2

## 2017-10-22 MED ORDER — SODIUM CHLORIDE 0.9 % IV SOLN
1.0000 mg | Freq: Once | INTRAVENOUS | Status: AC
  Administered 2017-10-22: 1 mg via INTRAVENOUS
  Filled 2017-10-22: qty 0.2

## 2017-10-22 MED ORDER — SODIUM CHLORIDE 0.9 % IV SOLN
INTRAVENOUS | Status: DC
  Administered 2017-10-22 – 2017-10-23 (×2): via INTRAVENOUS

## 2017-10-22 NOTE — ED Notes (Signed)
PT very concerned b/c he only arrived w/ one bag and a gallon of sweet tea.  He does not know what happened to the rest of his belongings

## 2017-10-22 NOTE — ED Notes (Signed)
Writer was presented to pts belonging, the person who found him brought all this belongings to the hospital.

## 2017-10-22 NOTE — ED Triage Notes (Signed)
According to person who dropped pt off, she did not know him, [place] and [place] help him.  Pt is sleepy but is responsive to voice.  Pt states that "nothing wrong, just tired."  Denies alcohol. Is homeless and out of his medications.

## 2017-10-23 ENCOUNTER — Encounter (HOSPITAL_COMMUNITY): Payer: Self-pay | Admitting: General Practice

## 2017-10-23 ENCOUNTER — Other Ambulatory Visit: Payer: Self-pay

## 2017-10-23 ENCOUNTER — Observation Stay (HOSPITAL_COMMUNITY): Payer: Medicaid Other

## 2017-10-23 ENCOUNTER — Other Ambulatory Visit (HOSPITAL_COMMUNITY): Payer: Self-pay

## 2017-10-23 DIAGNOSIS — M542 Cervicalgia: Secondary | ICD-10-CM | POA: Diagnosis present

## 2017-10-23 DIAGNOSIS — N179 Acute kidney failure, unspecified: Secondary | ICD-10-CM | POA: Diagnosis present

## 2017-10-23 DIAGNOSIS — R4182 Altered mental status, unspecified: Secondary | ICD-10-CM | POA: Diagnosis present

## 2017-10-23 DIAGNOSIS — Z043 Encounter for examination and observation following other accident: Secondary | ICD-10-CM | POA: Diagnosis present

## 2017-10-23 DIAGNOSIS — R410 Disorientation, unspecified: Secondary | ICD-10-CM | POA: Diagnosis not present

## 2017-10-23 DIAGNOSIS — F1414 Cocaine abuse with cocaine-induced mood disorder: Secondary | ICD-10-CM | POA: Diagnosis present

## 2017-10-23 DIAGNOSIS — I10 Essential (primary) hypertension: Secondary | ICD-10-CM | POA: Diagnosis not present

## 2017-10-23 DIAGNOSIS — E860 Dehydration: Secondary | ICD-10-CM | POA: Diagnosis present

## 2017-10-23 DIAGNOSIS — Z888 Allergy status to other drugs, medicaments and biological substances status: Secondary | ICD-10-CM | POA: Diagnosis not present

## 2017-10-23 DIAGNOSIS — F319 Bipolar disorder, unspecified: Secondary | ICD-10-CM | POA: Diagnosis not present

## 2017-10-23 DIAGNOSIS — F1410 Cocaine abuse, uncomplicated: Secondary | ICD-10-CM | POA: Diagnosis not present

## 2017-10-23 DIAGNOSIS — W010XXA Fall on same level from slipping, tripping and stumbling without subsequent striking against object, initial encounter: Secondary | ICD-10-CM | POA: Diagnosis present

## 2017-10-23 DIAGNOSIS — Z79899 Other long term (current) drug therapy: Secondary | ICD-10-CM | POA: Diagnosis not present

## 2017-10-23 DIAGNOSIS — Z560 Unemployment, unspecified: Secondary | ICD-10-CM | POA: Diagnosis not present

## 2017-10-23 DIAGNOSIS — E119 Type 2 diabetes mellitus without complications: Secondary | ICD-10-CM | POA: Diagnosis not present

## 2017-10-23 DIAGNOSIS — F3130 Bipolar disorder, current episode depressed, mild or moderate severity, unspecified: Secondary | ICD-10-CM | POA: Diagnosis present

## 2017-10-23 DIAGNOSIS — F17210 Nicotine dependence, cigarettes, uncomplicated: Secondary | ICD-10-CM | POA: Diagnosis not present

## 2017-10-23 DIAGNOSIS — Z590 Homelessness: Secondary | ICD-10-CM | POA: Diagnosis not present

## 2017-10-23 DIAGNOSIS — G4700 Insomnia, unspecified: Secondary | ICD-10-CM | POA: Diagnosis not present

## 2017-10-23 LAB — CBC
HCT: 37.6 % — ABNORMAL LOW (ref 39.0–52.0)
HEMOGLOBIN: 11.9 g/dL — AB (ref 13.0–17.0)
MCH: 27.2 pg (ref 26.0–34.0)
MCHC: 31.6 g/dL (ref 30.0–36.0)
MCV: 85.8 fL (ref 78.0–100.0)
Platelets: 246 10*3/uL (ref 150–400)
RBC: 4.38 MIL/uL (ref 4.22–5.81)
RDW: 14.4 % (ref 11.5–15.5)
WBC: 7.5 10*3/uL (ref 4.0–10.5)

## 2017-10-23 LAB — COMPREHENSIVE METABOLIC PANEL
ALBUMIN: 2.7 g/dL — AB (ref 3.5–5.0)
ALK PHOS: 56 U/L (ref 38–126)
ALT: 29 U/L (ref 17–63)
ANION GAP: 8 (ref 5–15)
AST: 44 U/L — ABNORMAL HIGH (ref 15–41)
BUN: 14 mg/dL (ref 6–20)
CALCIUM: 8.2 mg/dL — AB (ref 8.9–10.3)
CHLORIDE: 110 mmol/L (ref 101–111)
CO2: 21 mmol/L — AB (ref 22–32)
Creatinine, Ser: 1.14 mg/dL (ref 0.61–1.24)
GFR calc non Af Amer: >60 mL/min (ref >60)
GLUCOSE: 85 mg/dL (ref 65–99)
Potassium: 3.6 mmol/L (ref 3.5–5.1)
SODIUM: 139 mmol/L (ref 135–145)
Total Bilirubin: 0.4 mg/dL (ref 0.3–1.2)
Total Protein: 5.6 g/dL — ABNORMAL LOW (ref 6.5–8.1)

## 2017-10-23 LAB — HEMOGLOBIN A1C
Hgb A1c MFr Bld: 7.7 % — ABNORMAL HIGH (ref 4.8–5.6)
MEAN PLASMA GLUCOSE: 174.29 mg/dL

## 2017-10-23 LAB — CREATININE, SERUM
Creatinine, Ser: 2.01 mg/dL — ABNORMAL HIGH (ref 0.61–1.24)
GFR calc non Af Amer: 36 mL/min — ABNORMAL LOW (ref >60)
GFR, EST AFRICAN AMERICAN: 42 mL/min — AB (ref >60)

## 2017-10-23 LAB — RAPID URINE DRUG SCREEN, HOSP PERFORMED
AMPHETAMINES: NOT DETECTED
Barbiturates: NOT DETECTED
Benzodiazepines: NOT DETECTED
Cocaine: POSITIVE — AB
OPIATES: NOT DETECTED
TETRAHYDROCANNABINOL: NOT DETECTED

## 2017-10-23 LAB — CBG MONITORING, ED
Glucose-Capillary: 118 mg/dL — ABNORMAL HIGH (ref 65–99)
Glucose-Capillary: 160 mg/dL — ABNORMAL HIGH (ref 65–99)

## 2017-10-23 LAB — I-STAT ARTERIAL BLOOD GAS, ED
ACID-BASE DEFICIT: 2 mmol/L (ref 0.0–2.0)
Bicarbonate: 22.9 mmol/L (ref 20.0–28.0)
O2 SAT: 96 %
PCO2 ART: 40.5 mmHg (ref 32.0–48.0)
PH ART: 7.359 (ref 7.350–7.450)
Patient temperature: 98.6
TCO2: 24 mmol/L (ref 22–32)
pO2, Arterial: 88 mmHg (ref 83.0–108.0)

## 2017-10-23 LAB — TSH: TSH: 1.313 u[IU]/mL (ref 0.350–4.500)

## 2017-10-23 LAB — TROPONIN I
Troponin I: <0.03 ng/mL (ref <0.03)
Troponin I: <0.03 ng/mL (ref <0.03)

## 2017-10-23 LAB — HIV ANTIBODY (ROUTINE TESTING W REFLEX): HIV Screen 4th Generation wRfx: NONREACTIVE

## 2017-10-23 LAB — CK: Total CK: 1868 U/L — ABNORMAL HIGH (ref 49–397)

## 2017-10-23 LAB — GLUCOSE, CAPILLARY
GLUCOSE-CAPILLARY: 106 mg/dL — AB (ref 65–99)
Glucose-Capillary: 176 mg/dL — ABNORMAL HIGH (ref 65–99)

## 2017-10-23 MED ORDER — FOLIC ACID 1 MG PO TABS
1.0000 mg | ORAL_TABLET | Freq: Every day | ORAL | Status: DC
  Administered 2017-10-23 – 2017-10-25 (×3): 1 mg via ORAL
  Filled 2017-10-23 (×3): qty 1

## 2017-10-23 MED ORDER — VITAMIN B-1 100 MG PO TABS
100.0000 mg | ORAL_TABLET | Freq: Every day | ORAL | Status: DC
  Administered 2017-10-23 – 2017-10-25 (×3): 100 mg via ORAL
  Filled 2017-10-23 (×3): qty 1

## 2017-10-23 MED ORDER — ONDANSETRON HCL 4 MG PO TABS: 4.0000 mg | ORAL_TABLET | Freq: Four times a day (QID) | ORAL | Status: DC | PRN

## 2017-10-23 MED ORDER — ADULT MULTIVITAMIN W/MINERALS CH
1.0000 | ORAL_TABLET | Freq: Every day | ORAL | Status: DC
  Administered 2017-10-23 – 2017-10-25 (×3): 1 via ORAL
  Filled 2017-10-23 (×3): qty 1

## 2017-10-23 MED ORDER — CYCLOBENZAPRINE HCL 10 MG PO TABS: 10.0000 mg | ORAL_TABLET | Freq: Two times a day (BID) | ORAL | Status: DC | PRN

## 2017-10-23 MED ORDER — INSULIN ASPART 100 UNIT/ML ~~LOC~~ SOLN
0.0000 [IU] | Freq: Three times a day (TID) | SUBCUTANEOUS | Status: DC
  Administered 2017-10-23: 2 [IU] via SUBCUTANEOUS
  Administered 2017-10-24: 3 [IU] via SUBCUTANEOUS
  Administered 2017-10-24 – 2017-10-25 (×3): 2 [IU] via SUBCUTANEOUS
  Filled 2017-10-23: qty 1

## 2017-10-23 MED ORDER — THIAMINE HCL 100 MG/ML IJ SOLN
Freq: Once | INTRAVENOUS | Status: AC
  Administered 2017-10-23: 03:00:00 via INTRAVENOUS
  Filled 2017-10-23: qty 1000

## 2017-10-23 MED ORDER — TRAZODONE HCL 50 MG PO TABS
50.0000 mg | ORAL_TABLET | Freq: Every day | ORAL | Status: DC
  Administered 2017-10-23 – 2017-10-24 (×2): 50 mg via ORAL
  Filled 2017-10-23 (×2): qty 1

## 2017-10-23 MED ORDER — LORAZEPAM 2 MG/ML IJ SOLN: 0.5000 mg | Freq: Once | INTRAMUSCULAR | Status: DC

## 2017-10-23 MED ORDER — ONDANSETRON HCL 4 MG/2ML IJ SOLN: 4.0000 mg | Freq: Four times a day (QID) | INTRAMUSCULAR | Status: DC | PRN

## 2017-10-23 MED ORDER — IBUPROFEN 600 MG PO TABS: 600.0000 mg | ORAL_TABLET | Freq: Four times a day (QID) | ORAL | Status: DC | PRN

## 2017-10-23 MED ORDER — ENOXAPARIN SODIUM 40 MG/0.4ML ~~LOC~~ SOLN
40.0000 mg | SUBCUTANEOUS | Status: DC
  Administered 2017-10-24: 40 mg via SUBCUTANEOUS
  Filled 2017-10-23 (×2): qty 0.4

## 2017-10-23 NOTE — ED Provider Notes (Signed)
[facility] EMERGENCY DEPARTMENT Provider Note   CSN: [id number] Arrival date & time: 10/22/17  1912     History   Chief Complaint No chief complaint on file.  HPI   Blood pressure (!) 132/97, pulse 92, temperature 98.7 F (37.1 C), temperature source Oral, resp. rate 16, height 5\' 3"  (1.6 m), weight 99.8 kg (220 lb), SpO2 98 %.  Guenther Dunshee is a 57 y.o. male [place] by EMS [place].  Patient is homeless, he denies any alcohol or drug use today.  Patient is extremely somnolent.  Level 5 caveat due to altered mental status.  He is not endorsing any head trauma, he states that he is pain is unclear if this is in his chest or right shoulder.  Past Medical History:  Diagnosis Date  . Bipolar disorder (HCC)   . Depression   . Diabetes mellitus without complication (HCC)   . Hypertension   . Lumbar spinal cord injury [facility]) 2012    Patient Active Problem List   Diagnosis Date Noted  . 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 Bipolar 1 disorder, depressed (HCC) 06/06/2014    Past Surgical History:  Procedure Laterality Date  . BACK SURGERY    . EYE SURGERY          Home Medications    Prior to Admission medications   Medication Sig Start Date End Date Taking? Authorizing Provider  cyclobenzaprine (FLEXERIL) 10 MG tablet Take 1 tablet (10 mg total) by mouth 2 (two) times daily as needed for muscle spasms. 09/22/17   Geoffery Lyons, MD  ibuprofen (ADVIL,MOTRIN) 600 MG tablet Take 1 tablet (600 mg total) by mouth every 6 (six) hours as needed. 09/22/17   Geoffery Lyons, MD  lisinopril (ZESTRIL) 10 MG tablet Take 1 tablet (10 mg total) by mouth daily. Patient not taking: Reported on 11/07/2016 12/20/15   Zadie Rhine, MD  meloxicam (MOBIC) 7.5 MG tablet Take 2 tablets (15 mg total) by mouth daily. Patient not taking: Reported on 11/07/2016 12/19/15   Jerre Simon, PA  metFORMIN  (GLUCOPHAGE) 500 MG tablet Take 1 tablet (500 mg total) by mouth 2 (two) times daily with a meal. Patient not taking: Reported on 11/07/2016 12/20/15   Zadie Rhine, MD  oxyCODONE-acetaminophen (PERCOCET) 5-325 MG tablet Take 1 tablet by mouth every 6 (six) hours as needed. Patient not taking: Reported on 11/07/2016 12/25/15   Bethann Berkshire, MD  risperiDONE (RISPERIDONE M-TAB) 1 MG disintegrating tablet Take 1 tablet (1 mg total) by mouth 2 (two) times daily. Patient not taking: Reported on 12/21/2015 09/08/15   Harolyn Rutherford C, PA-C  silver sulfADIAZINE (SILVADENE) 1 % cream Apply 1 application topically daily. Patient not taking: Reported on 12/21/2015 08/14/15   Dione Booze, MD  tamsulosin (FLOMAX) 0.4 MG CAPS capsule Take 1 capsule (0.4 mg total) by mouth daily. Patient not taking: Reported on 12/21/2015 09/08/15   Anselm Pancoast, PA-C  traZODone (DESYREL) 50 MG tablet Take 1 tablet (50 mg total) by mouth at bedtime. 07/28/15   Earney Navy, NP    Family History No family history on file.  Social History Social History   Tobacco Use  . Smoking status: Current Every Day Smoker    Packs/day: 0.50    Years: 10.00    Pack years: 5.00    Types: Cigarettes  . Smokeless tobacco: Never Used  Substance Use Topics  . Alcohol use:  Yes    Comment: "Occasional"   . 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 No distress.  HENT:  Head: Normocephalic and atraumatic.  Mouth/Throat: Oropharynx is clear and moist.  Eyes: Pupils are equal, round, and reactive to light.  Conjunctivae and EOM are normal. No scleral icterus.  Neck: Normal range of motion.  Cardiovascular: Normal rate, regular rhythm and intact distal pulses.  Pulmonary/Chest: Effort normal and breath sounds normal. No stridor. No respiratory distress. He has no wheezes. He has no rales. He exhibits no tenderness.  Abdominal: Soft. There is no tenderness.  Musculoskeletal: Normal range of motion.  Neurological:  Somnolent, rousable to voice, + Gag reflex , MAE, PERRL  Skin: He is not diaphoretic.  Psychiatric: He has a normal mood and affect.  Nursing note and vitals reviewed.    ED Treatments / Results  Labs (all labs ordered are listed, but only abnormal results are displayed) Labs Reviewed  COMPREHENSIVE METABOLIC PANEL - Abnormal; Notable for the following components:      Result Value   CO2 20 (*)    Glucose, Bld 127 (*)    Creatinine, Ser 1.25 (*)    Albumin 3.3 (*)    AST 61 (*)    All other components within normal limits  ACETAMINOPHEN LEVEL - Abnormal; Notable for the following components:   Acetaminophen (Tylenol), Serum <10 (*)    All other components within normal limits  CBG MONITORING, ED - Abnormal; Notable for the following components:   Glucose-Capillary 116 (*)    All other components within normal limits  CBC WITH DIFFERENTIAL/PLATELET  ETHANOL  SALICYLATE LEVEL  AMMONIA  CK  BLOOD GAS, ARTERIAL  RAPID URINE DRUG SCREEN, HOSP PERFORMED  I-STAT TROPONIN, ED  I-STAT ARTERIAL BLOOD GAS, ED    EKG EKG Interpretation  Date/Time:  Sunday October 22 2017 22:40:03 EDT Ventricular Rate:  78 PR Interval:    QRS Duration: 74 QT Interval:  377 QTC Calculation: 430 R Axis:   54 Text Interpretation:  Sinus rhythm Anteroseptal infarct, old No significant change since last tracing Confirmed by Drema Pry [phone]) on 10/22/2017 11:58:52 PM   Radiology Dg Chest 1 View  Result Date: 10/22/2017 CLINICAL DATA:  Altered mental status. [place]  [place]. EXAM: CHEST  1 VIEW COMPARISON:  11/07/2016 FINDINGS: Shallow inspiration. Heart size and pulmonary vascularity are normal. No blunting of costophrenic angles. No pneumothorax. No consolidation or airspace disease. Postoperative changes in the cervical spine and left shoulder. Degenerative changes in the shoulders. IMPRESSION: Shallow inspiration.  No evidence of active pulmonary disease. Electronically Signed   By: Burman Nieves M.D.   On: 10/22/2017 22:27   Dg Shoulder Right  Result Date: 10/22/2017 CLINICAL DATA:  Altered mental status today. Patient was found unconscious of the bus stop. Shaking in the right arm. EXAM: RIGHT SHOULDER - 2+ VIEW COMPARISON:  06/25/2014 FINDINGS: Degenerative changes in the  glenohumeral and acromioclavicular joints. No evidence of acute fracture or dislocation. Focal area sclerosis in the proximal humeral shaft with mild adjacent periosteal thickening. No bone destruction. Appearance is similar to prior study. This likely represent enchondroma. Soft tissues are unremarkable. IMPRESSION: Degenerative changes in the right shoulder. No acute displaced fractures identified. 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 Electronically Signed   By: Burman NievesWilliam  Stevens M.D.   On: 10/22/2017 22:29   Ct Head Wo Contrast  Result Date: 10/22/2017 CLINICAL DATA:  Patient was found laying [place] the ground [place] the bus station. Sleepy but responsive. Altered level of consciousness. EXAM: CT HEAD WITHOUT CONTRAST CT CERVICAL SPINE WITHOUT CONTRAST TECHNIQUE: Multidetector CT imaging of the head and cervical spine was performed following the standard protocol without intravenous contrast. Multiplanar CT image reconstructions of the cervical spine were also generated. COMPARISON:  09/24/2017 FINDINGS: CT HEAD FINDINGS Brain: No evidence of acute infarction, hemorrhage, hydrocephalus, extra-axial  collection or mass lesion/mass effect. Vascular: Mild intracranial arterial vascular calcifications. Skull: Calvarium appears intact. No acute depressed skull fractures. Sinuses/Orbits: Paranasal sinuses and mastoid air cells are clear. Other: None. CT CERVICAL SPINE FINDINGS Alignment: Normal alignment of the cervical vertebrae and facet joints. C1-2 articulation appears intact. Skull base and vertebrae: No acute fracture. No primary bone lesion or focal pathologic process. Soft tissues and spinal canal: No prevertebral fluid or swelling. No visible canal hematoma. Disc levels: Anterior plate and screw fixation with intervertebral fusion from C3 through C5. Hardware appears intact. Few segments appear solid. Posterior disc osteophyte complex at C4-5 causes anterior effacement of the thecal sac. Degenerative changes in the lower cervical spine with disc space narrowing and endplate hypertrophic changes at C5-6, C6-7, and C7-T1 levels. Upper chest: Motion artifact limits evaluation of the lung apices. Probable mild scarring and emphysematous change. Other: None. IMPRESSION: 1. No acute intracranial abnormalities. 2. Anterior plate and screw fixation with intervertebral fusion from C3 through C5. Degenerative changes in the cervical spine. Normal alignment. No acute displaced fractures identified. Electronically Signed   By: Burman NievesWilliam  Stevens M.D.   On: 10/22/2017 23:31   Ct Cervical Spine Wo Contrast  Result Date: 10/22/2017 CLINICAL DATA:  Patient was [place]. Sleepy but responsive. Altered level of consciousness. EXAM: CT HEAD WITHOUT CONTRAST CT CERVICAL SPINE WITHOUT CONTRAST TECHNIQUE: Multidetector CT imaging of the head and cervical spine was performed following the standard protocol without intravenous contrast. Multiplanar CT image reconstructions of the cervical spine were also generated. COMPARISON:  09/24/2017 FINDINGS: CT HEAD FINDINGS Brain: No evidence of acute  infarction, hemorrhage, hydrocephalus, extra-axial collection or mass lesion/mass effect. Vascular: Mild intracranial arterial vascular calcifications. Skull: Calvarium appears intact. No acute depressed skull fractures. Sinuses/Orbits: Paranasal sinuses and mastoid air cells are clear. Other: None. CT CERVICAL SPINE FINDINGS Alignment: Normal alignment of the cervical vertebrae and facet joints. C1-2 articulation appears intact. Skull base and vertebrae: No acute fracture. No primary bone lesion or focal pathologic process. Soft tissues and spinal canal: No prevertebral fluid or swelling. No visible canal hematoma. Disc levels: Anterior plate and screw fixation with intervertebral fusion from C3 through C5. Hardware appears intact. Few segments appear solid. Posterior disc osteophyte complex at C4-5 causes anterior effacement of the thecal sac. Degenerative changes in the lower cervical spine with disc space narrowing and endplate hypertrophic changes at C5-6, C6-7, and C7-T1 levels. Upper chest: Motion artifact limits evaluation of the  lung apices. Probable mild scarring and emphysematous change. Other: None. IMPRESSION: 1. No acute intracranial abnormalities. 2. Anterior plate and screw fixation with intervertebral fusion from C3 through C5. Degenerative changes in the cervical spine. Normal alignment. No acute displaced fractures identified. Electronically Signed   By: Burman Nieves M.D.   On: 10/22/2017 23:31    Procedures Procedures (including critical care time)  Medications Ordered in ED Medications  folic acid 1 mg in sodium chloride 0.9 % 50 mL IVPB (has no administration in time range)  0.9 %  sodium chloride infusion ( Intravenous New Bag/Given 10/22/17 2245)  naloxone [facility]) injection 0.2 mg (0.2 mg Intravenous Given 10/22/17 2245)  thiamine (B-1) injection 100 mg (100 mg Intravenous Given 10/22/17 2245)     Initial Impression / Assessment and Plan / ED Course  I have reviewed the triage  vital signs and the nursing notes.  Pertinent labs & imaging results that were available during my care of the patient were reviewed by me and considered in my medical decision making (see chart for details).     Vitals:   10/22/17 1916  BP: (!) 132/97  Pulse: 92  Resp: 16  Temp: 98.7 F (37.1 C)  TempSrc: Oral  SpO2: 98%  Weight: 99.8 kg (220 lb)  Height: 5\' 3"  (1.6 m)    Medications  folic acid 1 mg in sodium chloride 0.9 % 50 mL IVPB (has no administration in time range)  0.9 %  sodium chloride infusion ( Intravenous New Bag/Given 10/22/17 2245)  naloxone [facility]) injection 0.2 mg (0.2 mg Intravenous Given 10/22/17 2245)  thiamine (B-1) injection 100 mg (100 mg Intravenous Given 10/22/17 2245)    Yigit Norkus is 57 y.o. male presenting with altered mental status, somnolent, grossly nonfocal neurologic exam.  Head CT negative, C-spine CT negative.  Imaging of his chest without infiltrate.  No response to Narcan.  Blood work reassuring, he has a negative ethanol level.  Given his unexplained alteration mentation will need admission, unassigned admission to [facility] hospital[facility], spoke with Dr. Mikeal Hawthorne who asked that we can expedite UDS, I was in the room as tach performed in and out cath, no urine was obtained fluid bolus opened.      Final Clinical Impressions(s) / ED Diagnoses   Final diagnoses:  Altered mental status  Neck pain    ED Discharge Orders    None       France Noyce, Mardella Layman 10/23/17 0037    Nira Conn, MD 10/23/17 [phone]

## 2017-10-23 NOTE — ED Notes (Signed)
Patient has removed his Condom Cath.

## 2017-10-23 NOTE — ED Notes (Signed)
Pt refusing cardiac monitoring at this time. Will transport without cardiac monitor

## 2017-10-23 NOTE — ED Notes (Signed)
Pt updated to room assignment.  

## 2017-10-23 NOTE — ED Notes (Signed)
Attempted report x1. 
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
## 2017-10-23 NOTE — ED Notes (Addendum)
Admitting paged to confirm Dr Benjamine MolaVann is aware pt is refusing MRI, EEG, VS, and IV

## 2017-10-23 NOTE — ED Notes (Signed)
Breakfast tray ordered. Heart healthy.  

## 2017-10-23 NOTE — ED Notes (Signed)
Pharmacy notified of patient's home medications - patient allowing staff to complete medication reconciliation so he may get his daily meds.

## 2017-10-23 NOTE — ED Notes (Signed)
Patient assisted back into bed after using the restroom. He is demanding to speak to a physician regarding his stay. MD made aware. 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 Patient states he wants to stay in the hospital, but is non-compliant with policies regarding his safety and medications.
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
## 2017-10-23 NOTE — ED Notes (Signed)
I was assisting patient with bagging his belongings in bags, He stated that he can do it himself. So I gave him several bags to place his clothes, Food and drinks into.

## 2017-10-23 NOTE — ED Notes (Signed)
PT with patient at this time.  

## 2017-10-23 NOTE — ED Notes (Signed)
Pt ambulating outside room in hallways. Pt asked not to walk over to other side of Pod F with behavioral patients. Pt verbalized understanding however pt continues to walk near desk. Will remind patient he must stay in his room or in the hallway on this side. Pt ambulatory with walker with steady gait.

## 2017-10-23 NOTE — Evaluation (Signed)
Physical Therapy Evaluation and D/C Patient Details Name: Charles Leon MRN: [id number] DOB: 04/27/1961 Today's Date: 10/23/2017   History of Present Illness  Charles Leon is a 57 y.o. male with medical history significant of bipolar disorder and other psychiatric illness including adjustent disorder, Diabetes, hypertension who was brought in  EMS [place].  AMS on arrival.   Clinical Impression  Pt admitted with above diagnosis. Pt currently with functional limitations however pt has had these limitations at baseline and pt appears to be at his baseline.  Does not listen to safety instructions and has poor safety awareness with rollator however no significant LOB in hospital environment. Has even been moving around the room on his own per nursing and has not had any falls but does demonstrate poor safety in room as well.  Pt is refusing care in hospital as well.  Do not feel that PT needs to follow pt as this PT does not feel that we can change pts function unless he will follow PT suggestions.  Not sure if CM or SW can assist pt with figuring out his housing situation and possibly getting him a new rollator.  Will sign off.        Follow Up Recommendations No PT follow up;Supervision - Intermittent    Equipment Recommendations  Other (comment)(current rollator brakes broken; new rollator would be great)    Recommendations for Other Services       Precautions / Restrictions Precautions Precautions: Fall Restrictions Weight Bearing Restrictions: No      Mobility  Bed Mobility Overal bed mobility: Independent                Transfers Overall transfer level: Independent               General transfer comment: Pt has been up in room off and on all day per nursing.  No falls noted.  Has poor safety and room has items strewn all over it.    Ambulation/Gait Ambulation/Gait assistance: Supervision;Modified independent (Device/Increase  time) Ambulation Distance (Feet): 300 Feet Assistive device: 4-wheeled walker Gait Pattern/deviations: Step-through pattern;Decreased stance time - left;Decreased dorsiflexion - right;Ataxic;Drifts right/left;Wide base of support   Gait velocity interpretation: at or above normal speed for age/gender General Gait Details: Pt with poor safety overall with rollator. Although pt has no LOB, he demonstrates poor awareness of safety overall.  Pt at times lets go of rollator when he needs to keep both hands on rollator for support.  When instructed pt will place hands on rollator but eventually take hands off again.  Nursing states he has been in halls even without the rollator.  Pt is a fall risk without device as he drags right LE at times but manages to keep balance when he has the rollator.  Impulsivity does not help pt.  This PT does feel that this is most likely patient's baseline and note that pt is refusing a lot of care.  Brakes do not lock on rollator so when pt does sit to rest, rollator does move slightly and this causing decr safety as well however pt seems to manage ok and moves slowly around to sit more safely.   Stairs            Wheelchair Mobility    Modified Rankin (Stroke Patients Only)       Balance Overall balance assessment: Needs assistance;History of Falls Sitting-balance support: No upper extremity supported;Feet supported Sitting balance-Leahy Scale: Good  Standing balance support: No upper extremity supported;During functional activity Standing balance-Leahy Scale: Fair Standing balance comment: can stand statically without UE support although limitations are evident.                 Standardized Balance Assessment Standardized Balance Assessment : Dynamic Gait Index   Dynamic Gait Index Level Surface: Mild Impairment Change in Gait Speed: Mild Impairment Gait with Horizontal Head Turns: Mild Impairment Gait with Vertical Head Turns: Mild  Impairment Gait and Pivot Turn: Mild Impairment Step Over Obstacle: Severe Impairment Step Around Obstacles: Severe Impairment Steps: Moderate Impairment Total Score: 11       Pertinent Vitals/Pain Pain Assessment: No/denies pain    Home Living Family/patient expects to be discharged to:: Shelter/Homeless                 Additional Comments: Pt reports he was stayjng in motel but did not get his debit card and couldn't pay for that or his meds and they kicked him out of the motell.      Prior Function Level of Independence: Independent with assistive device(s)         Comments: Pt uses rollator and carries all his stuff on it.  The brakes are broken.  The rest of the rollator functions properly.  Pt reports he is I with his bathing and dressing.       Hand Dominance        Extremity/Trunk Assessment   Upper Extremity Assessment Upper Extremity Assessment: Defer to OT evaluation    Lower Extremity Assessment Lower Extremity Assessment: RLE deficits/detail RLE Deficits / Details: hip 3+/5, knee 3/5, ankle 2+/5       Communication   Communication: No difficulties  Cognition Arousal/Alertness: Awake/alert Behavior During Therapy: Impulsive;Restless Overall Cognitive Status: History of cognitive impairments - at baseline                                 General Comments: Pt with very poor safety awareness.  Needed cues for safety with rollator taking his hands off of rollator at times when he should keep them both on for support.  Overall, poor safety awareness.       General Comments General comments (skin integrity, edema, etc.): Scored 11/24 on DGI suggesting at risk of falls without device.      Exercises     Assessment/Plan    PT Assessment Patent does not need any further PT services  PT Problem List Decreased balance;Decreased mobility;Decreased safety awareness       PT Treatment Interventions Patient/family education;Functional  mobility training;Gait training;DME instruction    PT Goals (Current goals can be found in the Care Plan section)  Acute Rehab PT Goals PT Goal Formulation: All assessment and education complete, DC therapy    Frequency     Barriers to discharge Decreased caregiver support(pt homeless)      Co-evaluation               AM-PAC PT "6 Clicks" Daily Activity  Outcome Measure Difficulty turning over in bed (including adjusting bedclothes, sheets and blankets)?: None Difficulty moving from lying on back to sitting on the side of the bed? : None Difficulty sitting down on and standing up from a chair with arms (e.g., wheelchair, bedside commode, etc,.)?: None Help needed moving to and from a bed to chair (including a wheelchair)?: None Help needed walking in hospital room?: None Help needed climbing  3-5 steps with a railing? : A Lot 6 Click Score: 22    End of Session Equipment Utilized During Treatment: Gait belt Activity Tolerance: Patient limited by fatigue Patient left: with call bell/phone within reach(pt was left sitting on rollator) Nurse Communication: Mobility status PT Visit Diagnosis: Unsteadiness on feet (R26.81);Muscle weakness (generalized) (M62.81);History of falling (Z91.81)    Time: 1610-9604 PT Time Calculation (min) (ACUTE ONLY): 23 min   Charges:   PT Evaluation $PT Eval Low Complexity: 1 Low PT Treatments $Gait Training: 8-22 mins   PT G Codes:        Weldon Nouri,PT Acute Rehabilitation [phone] (pager)   Berline Lopes 10/23/2017, 1:01 PM

## 2017-10-23 NOTE — ED Notes (Signed)
Patient called out and upon RN walking into room, patient began cursing and yelling over his breakfast, stating "that was nasty breakfast." Asked patient what he would like for breakfast so we could order something he would like and patient calmed down. Assisted him to restroom, ambulates with home walker with steady gait.

## 2017-10-23 NOTE — ED Notes (Signed)
Pt speaking on phone at [employer] [facility]. This RN told patient a third time he is not to walk over to other side of Pod F. Pt looked at this RN and stated "I am on the phone. I'm on the phone." Will remind patient again and call security if patient continues to disrupt patients on other side of Pod F
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
## 2017-10-23 NOTE — ED Notes (Signed)
Checked patient cbg it was 118 notified RN of blood sugar 

## 2017-10-23 NOTE — ED Notes (Signed)
Pt at the desk making a phone call at this time. Pt sitting on walker at this time, PT spoke with patient regarding importance of keeping both hands steadied on the walker when ambulating. Pt verbalized understanding.

## 2017-10-23 NOTE — ED Notes (Signed)
Pt ambulatory with steady gait. Pt intermittently using home walker. Pt speaking on the phone at the desk at this time. This RN asked patient if he needed any assistance, and recommended patient use his sitting walker while speaking on the phone. Pt stated, "I know what I'm doing. I'm fine."

## 2017-10-23 NOTE — ED Notes (Signed)
Pt insisting he pack all of his own belongings and place them on the bed, Pt getting ready at this time. Will transport patient once all belongings ready

## 2017-10-23 NOTE — Progress Notes (Signed)
Pt is refusing EEG at this time. The procedure was explained to the patient and he still refused and commented "I will sign a refusal"

## 2017-10-23 NOTE — ED Notes (Addendum)
Pt continues to refuse VS, cardiac monitoring, or most medications. Pt states "I am my own doctor." This RN spoke with patient and asked what his goals are in the hospital and how we can help him if he is not willing to be compliant with medications, procedures, or tests. Pt states "I came here for help! The doctor knows." Will continue to monitor. Pt A&Ox4.

## 2017-10-23 NOTE — ED Notes (Signed)
Patient refused EEG, continues to state that he has business to take care of. RN informed him of plan of care and willingness to follow hospital policies. Patient currently in room, doing laundry in the sink. He stated, "they did the EEG last time, I'm good."

## 2017-10-23 NOTE — ED Notes (Signed)
Heart Healthy Diet was ordered for Lunch. 

## 2017-10-24 ENCOUNTER — Inpatient Hospital Stay (HOSPITAL_COMMUNITY): Payer: Medicaid Other

## 2017-10-24 DIAGNOSIS — N179 Acute kidney failure, unspecified: Secondary | ICD-10-CM

## 2017-10-24 DIAGNOSIS — Z590 Homelessness: Secondary | ICD-10-CM

## 2017-10-24 DIAGNOSIS — Z560 Unemployment, unspecified: Secondary | ICD-10-CM

## 2017-10-24 DIAGNOSIS — R4182 Altered mental status, unspecified: Secondary | ICD-10-CM

## 2017-10-24 DIAGNOSIS — G4700 Insomnia, unspecified: Secondary | ICD-10-CM

## 2017-10-24 DIAGNOSIS — E860 Dehydration: Secondary | ICD-10-CM

## 2017-10-24 DIAGNOSIS — F1410 Cocaine abuse, uncomplicated: Secondary | ICD-10-CM

## 2017-10-24 DIAGNOSIS — F17210 Nicotine dependence, cigarettes, uncomplicated: Secondary | ICD-10-CM

## 2017-10-24 LAB — BASIC METABOLIC PANEL
Anion gap: 8 (ref 5–15)
BUN: 13 mg/dL (ref 6–20)
CO2: 21 mmol/L — ABNORMAL LOW (ref 22–32)
Calcium: 8.2 mg/dL — ABNORMAL LOW (ref 8.9–10.3)
Chloride: 111 mmol/L (ref 101–111)
Creatinine, Ser: 1.14 mg/dL (ref 0.61–1.24)
GFR calc Af Amer: >60 mL/min (ref >60)
GFR calc non Af Amer: >60 mL/min (ref >60)
Glucose, Bld: 196 mg/dL — ABNORMAL HIGH (ref 65–99)
Potassium: 3.6 mmol/L (ref 3.5–5.1)
Sodium: 140 mmol/L (ref 135–145)

## 2017-10-24 LAB — GLUCOSE, CAPILLARY
Glucose-Capillary: 166 mg/dL — ABNORMAL HIGH (ref 65–99)
Glucose-Capillary: 209 mg/dL — ABNORMAL HIGH (ref 65–99)
Glucose-Capillary: 155 mg/dL — ABNORMAL HIGH (ref 65–99)

## 2017-10-24 LAB — CK: CK TOTAL: 875 U/L — AB (ref 49–397)

## 2017-10-24 MED ORDER — LAMOTRIGINE 25 MG PO TABS: 25.0000 mg | ORAL_TABLET | Freq: Two times a day (BID) | ORAL | Status: DC

## 2017-10-24 MED ORDER — LAMOTRIGINE 25 MG PO TABS
25.0000 mg | ORAL_TABLET | Freq: Every day | ORAL | Status: DC
  Administered 2017-10-24: 25 mg via ORAL
  Filled 2017-10-24 (×2): qty 1

## 2017-10-24 MED ORDER — DIVALPROEX SODIUM 500 MG PO DR TAB
500.0000 mg | DELAYED_RELEASE_TABLET | Freq: Two times a day (BID) | ORAL | Status: DC
  Administered 2017-10-24 – 2017-10-25 (×2): 500 mg via ORAL
  Filled 2017-10-24 (×2): qty 1

## 2017-10-24 NOTE — Procedures (Signed)
ELECTROENCEPHALOGRAM REPORT  Date of Study: 10/24/2017  Patient's Name: Charles Leon MRN: [id number] Date of Birth: [birth year]  Referring Provider: Marlin CanaryJessica Vann, DO  Clinical History: 57 y.o. male with medical history significant of bipolar disorder and other psychiatric illness including adjustent disorder, Diabetes, hypertension who was brought in  EMS [place].  AMS on arrival.   Medications: Cyclobenzaprine Trazodone Zofran Thiamine Insulin  Technical Summary: A multichannel digital EEG recording measured by the international 10-20 system with electrodes applied with paste and impedances below 5000 ohms performed as portable with EKG monitoring in an awake and asleep patient.  Hyperventilation and photic stimulation were not performed.  The digital EEG was referentially recorded, reformatted, and digitally filtered in a variety of bipolar and referential montages for optimal display.   Description: The patient is awake and asleep during the recording.  Study is compromised by movement artifact.  During maximal wakefulness in the most readable segments, there is a symmetric, medium voltage 8 Hz posterior dominant rhythm that attenuates with eye opening. This is admixed with diffuse 4-5 Hz theta and 2-3 Hz delta slowing of the waking background.  During drowsiness and sleep, there is an increase in theta slowing of the background.  Vertex waves and symmetric sleep spindles were seen.  There were no epileptiform discharges or electrographic seizures seen.    EKG lead was unremarkable.  Impression: This sub-optimal awake and asleep EEG is abnormal due to diffuse slowing of the waking background.  Clinical Correlation of the above findings indicates diffuse cerebral dysfunction that is non-specific in etiology and can be seen with hypoxic/ischemic injury, toxic/metabolic encephalopathies, neurodegenerative disorders, or medication effect.  The absence of  epileptiform discharges does not rule out a clinical diagnosis of epilepsy.  Clinical correlation is advised.   Shon MilletAdam Jailynn Lavalais, DO
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
## 2017-10-24 NOTE — Progress Notes (Signed)
EEG complete - results pending.  Pt had very inappropriate behavior, he began masturbating during the test. Pt was asked to be still however he continued

## 2017-10-24 NOTE — Progress Notes (Signed)
NCM received consulted :Does patient still have ACT team for psych issues. Referral made to CSW. CSW to f/u.  Gae GallopAngela Dory Verdun RN,BSN,CM

## 2017-10-24 NOTE — Progress Notes (Signed)
Pt has self removed PIVx2 throughout night. Pt now refusing to have PIV replaced. Night covering provider Donnamarie Poag(K. Kirby) made aware. Pt otherwise compliant.

## 2017-10-24 NOTE — Progress Notes (Addendum)
PROGRESS NOTE    Charles Leon  WUJ:[id number] DOB: [birth year] DOA: 10/22/2017 PCP: Patient, No Pcp Per   Outpatient Specialists:     Brief Narrative:  Charles Leon is a 56 y.o. male with medical history significant of bipolar disorder and other psychiatric illness including adjustent disorder, Diabetes, hypertension who was brought in  EMS [place].Patient is currently confused with altered mental status. He is able to answer some questions. Complaining of nonspecific pain only.Patient denied using any drugs. Denied any alcohol intake.He also denied any trauma. He has not been taking most of his medications. He has some Risperdal among other medicines that he's got. He states he tripped and fell at the bus station     Assessment & Plan:   Principal Problem:   Altered mental state Active Problems:   Bipolar 1 disorder, depressed (HCC)   ARF (acute renal failure) (HCC)   Dehydration   AMS -? Due to cocaine use -resolved -refused MRI -EEG w/o seizures  rhabdo with AKI -resolved with IVF  Substance induced mood d/o/bipolar d/o -not sure what meds he is actually taking-- multiple in his bags -psych consult appreciated  Cocaine abuse -encouraged cessation    DVT prophylaxis:  ambulatory  Code Status: Full Code   Family Communication:   Disposition Plan:     Consultants:   psych   Subjective: Yesterday refused EEG and MRI Says he uses cocaine in his marijuana (UDS negative for marijuana)   Objective: Vitals:   10/23/17 1626 10/23/17 2123 10/24/17 0545 10/24/17 1418  BP: 133/86 (!) 143/83 122/79 (!) 123/96  Pulse: 86 87 90 84  Resp: 18 17 18 18   Temp: 98.6 F (37 C) 98.9 F (37.2 C) 98.4 F (36.9 C) 97.9 F (36.6 C)  TempSrc: Oral Oral Oral Oral  SpO2: 100% 100% 96% 100%  Weight:      Height: 5\' 8"  (1.727 m)       Intake/Output Summary (Last 24 hours) at 10/24/2017 1654 Last data filed at 10/24/2017  1300 Gross per 24 hour  Intake 2543.5 ml  Output -  Net 2543.5 ml   Filed Weights   10/22/17 1916  Weight: 99.8 kg (220 lb)    Examination:  General exam: walking around room- belongings strewn around Respiratory system: no increased work of breathing Cardiovascular system: rrr     Data Reviewed: I have personally reviewed following labs and imaging studies  CBC: Recent Labs  Lab 10/22/17 1938 10/23/17 0208  WBC 8.7 7.5  NEUTROABS 5.4  --   HGB 13.6 11.9*  HCT 41.6 37.6*  MCV 85.2 85.8  PLT 299 246   Basic Metabolic Panel: Recent Labs  Lab 10/22/17 1938 10/23/17 0208 10/24/17 0423  NA 138 139 140  K 3.9 3.6 3.6  CL 108 110 111  CO2 20* 21* 21*  GLUCOSE 127* 85 196*  BUN 18 14 13   CREATININE 1.25* 2.01*  1.14 1.14  CALCIUM 9.2 8.2* 8.2*   GFR: Estimated Creatinine Clearance: 84.9 mL/min (by C-G formula based on SCr of 1.14 mg/dL). Liver Function Tests: Recent Labs  Lab 10/22/17 1938 10/23/17 0208  AST 61* 44*  ALT 37 29  ALKPHOS 69 56  BILITOT 0.5 0.4  PROT 6.7 5.6*  ALBUMIN 3.3* 2.7*   No results for input(s): LIPASE, AMYLASE in the last 168 hours. Recent Labs  Lab 10/22/17 2243  AMMONIA 22   Coagulation Profile: No results for input(s): INR, PROTIME in the last 168  hours. Cardiac Enzymes: Recent Labs  Lab 10/22/17 0000 10/23/17 0208 10/23/17 0942 10/23/17 1318 10/24/17 0423  CKTOTAL 1,868*  --   --   --  875*  TROPONINI  --  <0.03 <0.03 <0.03  --    BNP (last 3 results) No results for input(s): PROBNP in the last 8760 hours. HbA1C: Recent Labs    10/23/17 0208  HGBA1C 7.7*   CBG: Recent Labs  Lab 10/23/17 1259 10/23/17 1819 10/23/17 2144 10/24/17 0750 10/24/17 1222  GLUCAP 160* 106* 176* 166* 209*   Lipid Profile: No results for input(s): CHOL, HDL, LDLCALC, TRIG, CHOLHDL, LDLDIRECT in the last 72 hours. Thyroid Function Tests: Recent Labs    10/23/17 0208  TSH 1.313   Anemia Panel: No results for input(s):  VITAMINB12, FOLATE, FERRITIN, TIBC, IRON, RETICCTPCT in the last 72 hours. Urine analysis:    Component Value Date/Time   COLORURINE YELLOW 08/20/2015 0336   APPEARANCEUR CLEAR 08/20/2015 0336   LABSPEC 1.025 08/20/2015 0336   PHURINE 6.0 08/20/2015 0336   GLUCOSEU 500 (A) 08/20/2015 0336   HGBUR NEGATIVE 08/20/2015 0336   BILIRUBINUR NEGATIVE 08/20/2015 0336   KETONESUR NEGATIVE 08/20/2015 0336   PROTEINUR 30 (A) 08/20/2015 0336   NITRITE NEGATIVE 08/20/2015 0336   LEUKOCYTESUR NEGATIVE 08/20/2015 0336     )No results found for this or any previous visit (from the past 240 hour(s)).    Anti-infectives (From admission, onward)   None       Radiology Studies: Dg Chest 1 View  Result Date: 10/22/2017 CLINICAL DATA:  Altered mental status. Patient was found unconscious at the bus stop. EXAM: CHEST  1 VIEW COMPARISON:  11/07/2016 FINDINGS: Shallow inspiration. Heart size and pulmonary vascularity are normal. No blunting of costophrenic angles. No pneumothorax. No consolidation or airspace disease. Postoperative changes in the cervical spine and left shoulder. Degenerative changes in the shoulders. IMPRESSION: Shallow inspiration.  No evidence of active pulmonary disease. Electronically Signed   By: Burman Nieves M.D.   On: 10/22/2017 22:27   Dg Shoulder Right  Result Date: 10/22/2017 CLINICAL DATA:  Altered mental status today. Patient was found unconscious of the bus stop. Shaking in the right arm. EXAM: RIGHT SHOULDER - 2+ VIEW COMPARISON:  06/25/2014 FINDINGS: Degenerative changes in the glenohumeral and acromioclavicular joints. No evidence of acute fracture or dislocation. Focal area sclerosis in the proximal humeral shaft with mild adjacent periosteal thickening. No bone destruction. Appearance is similar to prior study. This likely represent enchondroma. Soft tissues are unremarkable. IMPRESSION: Degenerative changes in the right shoulder. No acute displaced fractures  identified. 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 Electronically Signed   By: Burman Nieves M.D.   On: 10/22/2017 22:29   Ct Head Wo Contrast  Result Date: 10/22/2017 CLINICAL DATA:  Patient was found laying on the ground at the bus station. Sleepy but responsive. Altered level of consciousness. EXAM: CT HEAD WITHOUT CONTRAST CT CERVICAL SPINE WITHOUT CONTRAST TECHNIQUE: Multidetector CT imaging of the head and cervical spine was performed following the standard protocol without intravenous contrast. Multiplanar CT image reconstructions of the cervical spine were also generated. COMPARISON:  09/24/2017 FINDINGS: CT HEAD FINDINGS Brain: No evidence of acute infarction, hemorrhage, hydrocephalus, extra-axial collection or mass lesion/mass effect. Vascular: Mild intracranial arterial vascular calcifications. Skull: Calvarium appears intact. No acute depressed skull fractures. Sinuses/Orbits: Paranasal sinuses and mastoid air cells are clear. Other: None. CT CERVICAL SPINE FINDINGS Alignment: Normal alignment  of the cervical vertebrae and facet joints. C1-2 articulation appears intact. Skull base and vertebrae: No acute fracture. No primary bone lesion or focal pathologic process. Soft tissues and spinal canal: No prevertebral fluid or swelling. No visible canal hematoma. Disc levels: Anterior plate and screw fixation with intervertebral fusion from C3 through C5. Hardware appears intact. Few segments appear solid. Posterior disc osteophyte complex at C4-5 causes anterior effacement of the thecal sac. Degenerative changes in the lower cervical spine with disc space narrowing and endplate hypertrophic changes at C5-6, C6-7, and C7-T1 levels. Upper chest: Motion artifact limits evaluation of the lung apices. Probable mild scarring and emphysematous change. Other: None. IMPRESSION: 1. No acute intracranial abnormalities. 2.  Anterior plate and screw fixation with intervertebral fusion from C3 through C5. Degenerative changes in the cervical spine. Normal alignment. No acute displaced fractures identified. Electronically Signed   By: Burman NievesWilliam  Stevens M.D.   On: 10/22/2017 23:31   Ct Cervical Spine Wo Contrast  Result Date: 10/22/2017 CLINICAL DATA:  Patient was found laying [place] the ground [place] bus station. Sleepy but responsive. Altered level of consciousness. EXAM: CT HEAD WITHOUT CONTRAST CT CERVICAL SPINE WITHOUT CONTRAST TECHNIQUE: Multidetector CT imaging of the head and cervical spine was performed following the standard protocol without intravenous contrast. Multiplanar CT image reconstructions of the cervical spine were also generated. COMPARISON:  09/24/2017 FINDINGS: CT HEAD FINDINGS Brain: No evidence of acute infarction, hemorrhage, hydrocephalus, extra-axial collection or mass lesion/mass effect. Vascular: Mild intracranial arterial vascular calcifications. Skull: Calvarium appears intact. No acute depressed skull fractures. Sinuses/Orbits: Paranasal sinuses and mastoid air cells are clear. Other: None. CT CERVICAL SPINE FINDINGS Alignment: Normal alignment of the cervical vertebrae and facet joints. C1-2 articulation appears intact. Skull base and vertebrae: No acute fracture. No primary bone lesion or focal pathologic process. Soft tissues and spinal canal: No prevertebral fluid or swelling. No visible canal hematoma. Disc levels: Anterior plate and screw fixation with intervertebral fusion from C3 through C5. Hardware appears intact. Few segments appear solid. Posterior disc osteophyte complex at C4-5 causes anterior effacement of the thecal sac. Degenerative changes in the lower cervical spine with disc space narrowing and endplate hypertrophic changes at C5-6, C6-7, and C7-T1 levels. Upper chest: Motion artifact limits evaluation of the lung apices. Probable mild scarring and emphysematous change. Other: None.  IMPRESSION: 1. No acute intracranial abnormalities. 2. Anterior plate and screw fixation with intervertebral fusion from C3 through C5. Degenerative changes in the cervical spine. Normal alignment. No acute displaced fractures identified. Electronically Signed   By: Burman NievesWilliam  Stevens M.D.   On: 10/22/2017 23:31        Scheduled Meds: . enoxaparin (LOVENOX) injection  40 mg Subcutaneous Q24H  . folic acid  1 mg Oral Daily  . insulin aspart  0-9 Units Subcutaneous TID WC  . multivitamin with minerals  1 tablet Oral Daily  . thiamine  100 mg Oral Daily  . traZODone  50 mg Oral QHS   Continuous Infusions: . sodium chloride Stopped (10/24/17 0200)     LOS: 1 day    Time spent: 15 min    Joseph ArtJessica U Krissi Willaims, DO [facility] Pager [phone]  If 7PM-7AM, please contact night-coverage [email or web address] Password [email or web address] 10/24/2017, 4:54 PM

## 2017-10-24 NOTE — Progress Notes (Signed)
CSW received consult to see if patient is connected with ACTT team. Per [name] clinician, patient does not have an ACTT team and is not set up with a service coordinator. [facility] will [name] for patient to go to be assessed for services.    Charles Cascoadia Yareni Creps LCSW [phone]

## 2017-10-25 ENCOUNTER — Emergency Department (HOSPITAL_COMMUNITY)
Admission: EM | Admit: 2017-10-25 | Discharge: 2017-10-25 | Disposition: A | Discharge status: Home / Self Care | Payer: Medicaid Other | Attending: Emergency Medicine | Admitting: Emergency Medicine

## 2017-10-25 ENCOUNTER — Encounter (HOSPITAL_COMMUNITY): Payer: Self-pay | Admitting: Emergency Medicine

## 2017-10-25 DIAGNOSIS — Z043 Encounter for examination and observation following other accident: Secondary | ICD-10-CM | POA: Diagnosis not present

## 2017-10-25 DIAGNOSIS — I10 Essential (primary) hypertension: Secondary | ICD-10-CM | POA: Insufficient documentation

## 2017-10-25 DIAGNOSIS — F17210 Nicotine dependence, cigarettes, uncomplicated: Secondary | ICD-10-CM | POA: Insufficient documentation

## 2017-10-25 DIAGNOSIS — E119 Type 2 diabetes mellitus without complications: Secondary | ICD-10-CM | POA: Insufficient documentation

## 2017-10-25 DIAGNOSIS — W19XXXA Unspecified fall, initial encounter: Secondary | ICD-10-CM

## 2017-10-25 DIAGNOSIS — Z590 Homelessness: Secondary | ICD-10-CM | POA: Insufficient documentation

## 2017-10-25 LAB — GLUCOSE, CAPILLARY
GLUCOSE-CAPILLARY: 147 mg/dL — AB (ref 65–99)
Glucose-Capillary: 124 mg/dL — ABNORMAL HIGH (ref 65–99)

## 2017-10-25 LAB — CBG MONITORING, ED: GLUCOSE-CAPILLARY: 87 mg/dL (ref 65–99)

## 2017-10-25 LAB — MRSA PCR SCREENING: MRSA by PCR: NEGATIVE

## 2017-10-25 MED ORDER — LAMOTRIGINE 25 MG PO TABS: ORAL_TABLET | ORAL | 0 refills | Status: DC

## 2017-10-25 MED ORDER — DIVALPROEX SODIUM 500 MG PO DR TAB: 500.0000 mg | DELAYED_RELEASE_TABLET | Freq: Two times a day (BID) | ORAL | 0 refills | Status: AC

## 2017-10-25 MED ORDER — METFORMIN HCL 500 MG PO TABS: 500.0000 mg | ORAL_TABLET | Freq: Two times a day (BID) | ORAL | 0 refills | Status: DC

## 2017-10-25 NOTE — Progress Notes (Signed)
   10/25/17 0049  What Happened  Was fall witnessed? No  Was patient injured? No  Patient found other (Comment) (standing in room)  Found by Staff-comment (Cressida Milford rn)  Stated prior activity ambulating-unassisted  Follow Up  MD notified K.Kirby NP  Time MD notified [phone]  Family notified No- patient refusal  Additional tests No  Simple treatment Other (comment) (none)  Adult Fall Risk Assessment  Risk Factor Category (scoring not indicated) High fall risk per protocol (document High fall risk)  Patient's Fall Risk High Fall Risk (>13 points)  Adult Fall Risk Interventions  Required Bundle Interventions *See Row Information* High fall risk - low, moderate, and high requirements implemented  Additional Interventions Use of appropriate toileting equipment (bedpan, BSC, etc.);Reorient/diversional activities with confused patients  Screening for Fall Injury Risk (To be completed on HIGH fall risk patients) - Assessing Need for Low Bed  Risk For Fall Injury- Low Bed Criteria Previous fall this admission  Will Implement Low Bed and Floor Mats Yes  Screening for Fall Injury Risk (To be completed on HIGH fall risk patients who do not meet crieteria for Low Bed) - Assessing Need for Floor Mats Only  Risk For Fall Injury- Criteria for Floor Mats None identified - No additional interventions needed  Vitals  Temp 97.8 F (36.6 C)  Temp Source Oral  BP (!) 148/108  MAP (mmHg) 80  BP Location Right Arm  BP Method Automatic  Patient Position (if appropriate) Sitting  Pulse Rate 80  Pulse Rate Source Dinamap  Oxygen Therapy  SpO2 98 %  O2 Device Room Air  Pain Assessment  Pain Scale 0-10  Pain Score 0  Neurological  Neuro (WDL) X  Level of Consciousness Alert  Orientation Level Oriented X4  Cognition Impulsive  Speech Clear  Glasgow Coma Scale  Eye Opening 4  Best Motor Response 6  Best Verbal Response (NON-intubated) 5  Glasgow Coma Scale Score 15  Musculoskeletal   Musculoskeletal (WDL) X  Assistive Device Four wheel walker  Generalized Weakness Yes  Weight Bearing Restrictions No  Musculoskeletal Details  RLE Limited movement (noted sliding on floor)  Integumentary  Integumentary (WDL) X  RN Assisting with Skin Assessment on Admission refused skin assessment  Skin Color Appropriate for ethnicity  Skin Condition Dry;Flaky  Skin Integrity Rash  Rash Location Arm  Rash Location Orientation Right  Pain Assessment  Work-Related Injury No

## 2017-10-25 NOTE — Progress Notes (Signed)
Patient continues to ambulate in room and hallways, refusing to wear yellow socks, but is wearing shoes. Denies any pain or injury from fall previously. No neuro changes noted.

## 2017-10-25 NOTE — Progress Notes (Signed)
Patient used the emergency alarm button on wall, when care nurse entered room patient standing in front of call bell system on wall. Patient stated "I fell" denies hitting head and denies any pain. Patient has weakness to legs with one assist to bed. Patient refuses staff to look at skin. After taking vitals patient snatched off bp cuff and pulse oximeter stating " I am ok yall can leave now", educated patient of safety precautions and wearing yellow nonskid socks patient refused to allow care nurse to put on. Patient also refused bed alarm once it was placed. Noticed a lighter on the computer on wheels placed into bag with patient label and into bin for retrieval at discharge. Notified K.Kirby, NP.

## 2017-10-25 NOTE — ED Notes (Addendum)
Patient behind nurses station stating that he needs to get something out of his bags. Verbally abusive. Will not go back in room. Security called. Unable to get vitals or blood sugar. Patient came around nurse station and grabbed chips out of bag, yelling.

## 2017-10-25 NOTE — Progress Notes (Addendum)
Per patient, he has too much stuff to carry on the bus. CSW received permission from Director to provide one time taxi voucher.   CSW provided patient with bus pass on chart.   CSW signing off.  Osborne Cascoadia Mitchelle Sultan LCSW [phone]
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
## 2017-10-25 NOTE — ED Notes (Signed)
Pt refused discharge vital signs remains passive aggressive and angry. Pt refuses to verbalize or discuss his anger when approached.

## 2017-10-25 NOTE — Progress Notes (Signed)
[facility]. Patient became very loud and agitated with the method of transportation that he was going to receive but this was resolved after CSW gave him a taxi voucher. Discharge teaching provided by Danne HarborGlodean Yorrick, RN. Medications retreived from pharmacy and given to patient. Patient ambulated off unit with nurse and nurse tech.

## 2017-10-25 NOTE — Progress Notes (Addendum)
NCM received consult : Patient is having trouble contacting parole office, can you help== this is the number he gave me [phone].  NCM called above # and received a recording stating voice message not set up to leave message, attempted call unsuccessful. NCM made pt aware. Gae GallopAngela Ramaj Frangos RN,BSN,CM
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
## 2017-10-26 ENCOUNTER — Other Ambulatory Visit: Payer: Self-pay

## 2017-10-26 ENCOUNTER — Emergency Department (HOSPITAL_COMMUNITY): Payer: Medicaid Other

## 2017-10-26 ENCOUNTER — Encounter (HOSPITAL_COMMUNITY): Payer: Self-pay | Admitting: Emergency Medicine

## 2017-10-26 ENCOUNTER — Emergency Department (HOSPITAL_COMMUNITY)
Admission: EM | Admit: 2017-10-26 | Discharge: 2017-10-26 | Disposition: A | Discharge status: Home / Self Care | Payer: Medicaid Other | Source: Home / Self Care | Attending: Emergency Medicine | Admitting: Emergency Medicine

## 2017-10-26 ENCOUNTER — Emergency Department (HOSPITAL_COMMUNITY)
Admission: EM | Admit: 2017-10-26 | Discharge: 2017-10-26 | Disposition: A | Discharge status: Home / Self Care | Payer: Medicaid Other | Attending: Emergency Medicine | Admitting: Emergency Medicine

## 2017-10-26 ENCOUNTER — Other Ambulatory Visit: Payer: Self-pay | Admitting: Emergency Medicine

## 2017-10-26 DIAGNOSIS — F17210 Nicotine dependence, cigarettes, uncomplicated: Secondary | ICD-10-CM | POA: Insufficient documentation

## 2017-10-26 DIAGNOSIS — E119 Type 2 diabetes mellitus without complications: Secondary | ICD-10-CM | POA: Insufficient documentation

## 2017-10-26 DIAGNOSIS — R0789 Other chest pain: Secondary | ICD-10-CM | POA: Diagnosis not present

## 2017-10-26 DIAGNOSIS — F419 Anxiety disorder, unspecified: Secondary | ICD-10-CM

## 2017-10-26 DIAGNOSIS — I10 Essential (primary) hypertension: Secondary | ICD-10-CM | POA: Diagnosis not present

## 2017-10-26 DIAGNOSIS — R079 Chest pain, unspecified: Secondary | ICD-10-CM | POA: Diagnosis present

## 2017-10-26 LAB — COMPREHENSIVE METABOLIC PANEL
ALT: 39 U/L (ref 17–63)
ANION GAP: 10 (ref 5–15)
AST: 56 U/L — ABNORMAL HIGH (ref 15–41)
Albumin: 3.5 g/dL (ref 3.5–5.0)
Alkaline Phosphatase: 65 U/L (ref 38–126)
BUN: 13 mg/dL (ref 6–20)
CHLORIDE: 109 mmol/L (ref 101–111)
CO2: 20 mmol/L — AB (ref 22–32)
Calcium: 9.5 mg/dL (ref 8.9–10.3)
Creatinine, Ser: 1.02 mg/dL (ref 0.61–1.24)
GFR calc non Af Amer: >60 mL/min (ref >60)
Glucose, Bld: 92 mg/dL (ref 65–99)
POTASSIUM: 3.9 mmol/L (ref 3.5–5.1)
SODIUM: 139 mmol/L (ref 135–145)
Total Bilirubin: 0.6 mg/dL (ref 0.3–1.2)
Total Protein: 7.3 g/dL (ref 6.5–8.1)

## 2017-10-26 LAB — CBC
HCT: 43.3 % (ref 39.0–52.0)
Hemoglobin: 13.8 g/dL (ref 13.0–17.0)
MCH: 27.8 pg (ref 26.0–34.0)
MCHC: 31.9 g/dL (ref 30.0–36.0)
MCV: 87.3 fL (ref 78.0–100.0)
Platelets: 292 10*3/uL (ref 150–400)
RBC: 4.96 MIL/uL (ref 4.22–5.81)
RDW: 14.6 % (ref 11.5–15.5)
WBC: 8.7 10*3/uL (ref 4.0–10.5)

## 2017-10-26 LAB — I-STAT TROPONIN, ED: Troponin i, poc: 0.01 ng/mL (ref 0.00–0.08)

## 2017-10-26 MED ORDER — CYCLOBENZAPRINE HCL 10 MG PO TABS
10.0000 mg | ORAL_TABLET | Freq: Once | ORAL | Status: AC
  Administered 2017-10-26: 10 mg via ORAL
  Filled 2017-10-26: qty 1

## 2017-10-26 MED ORDER — METHOCARBAMOL 500 MG PO TABS: 500.0000 mg | ORAL_TABLET | Freq: Three times a day (TID) | ORAL | 0 refills | Status: DC | PRN

## 2017-10-26 MED ORDER — IBUPROFEN 800 MG PO TABS: 800.0000 mg | ORAL_TABLET | Freq: Three times a day (TID) | ORAL | 0 refills | Status: DC

## 2017-10-26 NOTE — ED Notes (Signed)
Patient ambulatory with walker to restroom
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
## 2017-10-26 NOTE — ED Notes (Addendum)
Patient I-stat troponin result 0.01, drawn during downtime. EDP Isaacs notified.

## 2017-10-26 NOTE — Progress Notes (Signed)
Labs obtained during downtime.

## 2017-10-26 NOTE — ED Notes (Signed)
EKG complete in triage during downtime

## 2017-10-26 NOTE — Discharge Instructions (Addendum)
You are being picked up at noon.

## 2017-10-26 NOTE — ED Triage Notes (Signed)
Patient triaged during downtime. See paper charting.

## 2017-10-26 NOTE — ED Notes (Signed)
Attempt to obtain discharge patient signature multiple times; pad erases signature. Pt signed multiple times; witnessed by Marriottoyce RN.
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
## 2017-10-26 NOTE — ED Notes (Signed)
Bed: WLPT4 Expected date:  Expected time:  Means of arrival:  Comments: 

## 2017-10-26 NOTE — ED Triage Notes (Signed)
Pt requesting to see a psychiatrist for "my behavior; I have Bipolar." Denies SI/HI.

## 2017-10-26 NOTE — Progress Notes (Signed)
Patient was found having a bowel movement in the trash can. When asked why he replied he was having diarrhea and could not make it to the bathroom. Nurse inspected the bowel and found that it was solid. Patient also continuously coming out the room with all of his things. When asked why he stated that he was doing this to wait on the doctor. Patient was also inappropriate with staff blowing kisses .

## 2017-10-28 ENCOUNTER — Encounter (HOSPITAL_COMMUNITY): Payer: Self-pay

## 2017-10-28 ENCOUNTER — Emergency Department (HOSPITAL_COMMUNITY)
Admission: EM | Admit: 2017-10-28 | Discharge: 2017-10-28 | Disposition: A | Discharge status: Home / Self Care | Payer: Medicaid Other | Attending: Emergency Medicine | Admitting: Emergency Medicine

## 2017-10-28 ENCOUNTER — Other Ambulatory Visit: Payer: Self-pay

## 2017-10-28 DIAGNOSIS — I10 Essential (primary) hypertension: Secondary | ICD-10-CM | POA: Diagnosis not present

## 2017-10-28 DIAGNOSIS — M549 Dorsalgia, unspecified: Secondary | ICD-10-CM | POA: Diagnosis present

## 2017-10-28 DIAGNOSIS — F17210 Nicotine dependence, cigarettes, uncomplicated: Secondary | ICD-10-CM | POA: Diagnosis not present

## 2017-10-28 DIAGNOSIS — Z79899 Other long term (current) drug therapy: Secondary | ICD-10-CM | POA: Insufficient documentation

## 2017-10-28 DIAGNOSIS — Z7984 Long term (current) use of oral hypoglycemic drugs: Secondary | ICD-10-CM | POA: Insufficient documentation

## 2017-10-28 DIAGNOSIS — M545 Low back pain, unspecified: Secondary | ICD-10-CM

## 2017-10-28 DIAGNOSIS — W19XXXA Unspecified fall, initial encounter: Secondary | ICD-10-CM

## 2017-10-28 DIAGNOSIS — E119 Type 2 diabetes mellitus without complications: Secondary | ICD-10-CM | POA: Insufficient documentation

## 2017-10-28 MED ORDER — ACETAMINOPHEN 325 MG PO TABS
650.0000 mg | ORAL_TABLET | Freq: Once | ORAL | Status: AC
  Administered 2017-10-28: 650 mg via ORAL
  Filled 2017-10-28: qty 2

## 2017-10-28 MED ORDER — NAPROXEN 500 MG PO TABS: 500.0000 mg | ORAL_TABLET | Freq: Two times a day (BID) | ORAL | 0 refills | Status: DC
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
## 2017-10-28 NOTE — ED Triage Notes (Signed)
EMS has found monitor leads in one of multiple bags he has with him as well as a large amount of hospital gloves.

## 2017-10-28 NOTE — ED Notes (Addendum)
Pt has urinated on himself.  States he was trying to go to the bathroom at [facility] [place] the [place] but it was locked and he couldn't hold it.  He has been given a wet washcloth, and dry towel to clean up as he has said he can change himself w/clean clothes he has brought w/him.

## 2017-10-28 NOTE — Discharge Instructions (Signed)
As discussed, pick up your prescription for muscle relaxer and take naproxen twice a day with food as needed.  Warm compresses and massaging the area may help as well.  Follow the back exercises provided.  Do not drive or operate machinery while taking her muscle relaxant.  Follow-up with the wellness center in 1 week if symptoms persist. Return if symptoms worsen, numbness, weakness, loss of bowel bladder function or other new concerning symptoms in the meantime.
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
## 2017-10-28 NOTE — ED Notes (Signed)
Bed: WTR6 Expected date:  Expected time:  Means of arrival:  Comments: 57 yo fall; back pain

## 2017-10-29 ENCOUNTER — Emergency Department (HOSPITAL_COMMUNITY)
Admission: EM | Admit: 2017-10-29 | Discharge: 2017-10-29 | Disposition: A | Discharge status: Home / Self Care | Payer: Medicaid Other | Attending: Emergency Medicine | Admitting: Emergency Medicine

## 2017-10-29 ENCOUNTER — Encounter (HOSPITAL_COMMUNITY): Payer: Self-pay | Admitting: Emergency Medicine

## 2017-10-29 ENCOUNTER — Other Ambulatory Visit: Payer: Self-pay

## 2017-10-29 DIAGNOSIS — M549 Dorsalgia, unspecified: Secondary | ICD-10-CM | POA: Diagnosis present

## 2017-10-29 DIAGNOSIS — Z5321 Procedure and treatment not carried out due to patient leaving prior to being seen by health care provider: Secondary | ICD-10-CM | POA: Diagnosis not present

## 2017-10-29 NOTE — ED Notes (Signed)
Pt called x3 for room, no response. 

## 2017-10-29 NOTE — ED Notes (Signed)
PT unable to be located in waiting room and bathrooms.

## 2017-10-29 NOTE — ED Triage Notes (Signed)
EMS stated , [facility] , back pain released and called for transportation here.

## 2017-10-29 NOTE — ED Notes (Signed)
Called pt for room, no response. 

## 2017-10-30 ENCOUNTER — Emergency Department (HOSPITAL_COMMUNITY)
Admission: EM | Admit: 2017-10-30 | Discharge: 2017-10-30 | Disposition: A | Discharge status: Home / Self Care | Payer: Medicaid Other | Attending: Emergency Medicine | Admitting: Emergency Medicine

## 2017-10-30 ENCOUNTER — Emergency Department (HOSPITAL_COMMUNITY)
Admission: EM | Admit: 2017-10-30 | Discharge: 2017-10-31 | Disposition: A | Discharge status: Home / Self Care | Payer: Medicaid Other | Source: Home / Self Care | Attending: Emergency Medicine | Admitting: Emergency Medicine

## 2017-10-30 ENCOUNTER — Encounter (HOSPITAL_COMMUNITY): Payer: Self-pay | Admitting: Emergency Medicine

## 2017-10-30 ENCOUNTER — Other Ambulatory Visit: Payer: Self-pay

## 2017-10-30 ENCOUNTER — Emergency Department (HOSPITAL_COMMUNITY): Payer: Medicaid Other

## 2017-10-30 DIAGNOSIS — M25562 Pain in left knee: Secondary | ICD-10-CM | POA: Insufficient documentation

## 2017-10-30 DIAGNOSIS — F17210 Nicotine dependence, cigarettes, uncomplicated: Secondary | ICD-10-CM | POA: Insufficient documentation

## 2017-10-30 DIAGNOSIS — M25561 Pain in right knee: Secondary | ICD-10-CM | POA: Insufficient documentation

## 2017-10-30 DIAGNOSIS — Z79899 Other long term (current) drug therapy: Secondary | ICD-10-CM | POA: Diagnosis not present

## 2017-10-30 DIAGNOSIS — E119 Type 2 diabetes mellitus without complications: Secondary | ICD-10-CM | POA: Diagnosis not present

## 2017-10-30 DIAGNOSIS — W19XXXA Unspecified fall, initial encounter: Secondary | ICD-10-CM

## 2017-10-30 DIAGNOSIS — I10 Essential (primary) hypertension: Secondary | ICD-10-CM | POA: Diagnosis not present

## 2017-10-30 DIAGNOSIS — M545 Low back pain, unspecified: Secondary | ICD-10-CM

## 2017-10-30 DIAGNOSIS — M25569 Pain in unspecified knee: Secondary | ICD-10-CM | POA: Diagnosis present

## 2017-10-30 DIAGNOSIS — Z7984 Long term (current) use of oral hypoglycemic drugs: Secondary | ICD-10-CM | POA: Diagnosis not present

## 2017-10-30 DIAGNOSIS — G8929 Other chronic pain: Secondary | ICD-10-CM

## 2017-10-30 MED ORDER — ACETAMINOPHEN 500 MG PO TABS
1000.0000 mg | ORAL_TABLET | Freq: Once | ORAL | Status: AC
  Administered 2017-10-30: 1000 mg via ORAL
  Filled 2017-10-30: qty 2

## 2017-10-30 NOTE — ED Triage Notes (Signed)
Pt reports back pain x3 years. Reports heavy lifting yesterday, unsure how much.
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
## 2017-10-30 NOTE — Discharge Instructions (Signed)
Your evaluation today is reassuring.  You can use Tylenol in addition to the naproxen you were prescribed 2 days ago.  Use ice and heat and rest as needed.  Follow-up with primary care.  Return to the emergency department for numbness, weakness significantly worsening pain, difficulty walking or loss of bowel or bladder control or any other new or concerning symptoms.

## 2017-10-30 NOTE — ED Notes (Signed)
Patient transported to CT 
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
## 2017-10-30 NOTE — ED Notes (Signed)
[facility], [employer] came and pt made up a reason to come to the ED

## 2017-10-31 ENCOUNTER — Emergency Department (HOSPITAL_COMMUNITY): Payer: Medicaid Other

## 2017-10-31 ENCOUNTER — Encounter (HOSPITAL_COMMUNITY): Payer: Self-pay | Admitting: Radiology

## 2017-10-31 MED ORDER — NAPROXEN 500 MG PO TABS: 500.0000 mg | ORAL_TABLET | Freq: Two times a day (BID) | ORAL | 0 refills | Status: AC

## 2017-10-31 MED ORDER — NAPROXEN 250 MG PO TABS
500.0000 mg | ORAL_TABLET | Freq: Once | ORAL | Status: AC
  Administered 2017-10-31: 500 mg via ORAL
  Filled 2017-10-31: qty 2

## 2017-10-31 NOTE — Discharge Instructions (Signed)
You were seen in the emergency department today after a fall.  Your CT of your head and neck did not show any new problems.  Your x-ray of your back did not show any new problems.  There were degenerative changes on the imaging of your neck and back, this is normal with aging.  We have given you a prescription for Naproxen. Naproxen is a nonsteroidal anti-inflammatory medication that will help with pain and swelling. Be sure to take this medication as prescribed with food, 1 pill every 12 hours,  It should be taken with food, as it can cause stomach upset, and more seriously, stomach bleeding. Do not take other nonsteroidal anti-inflammatory medications with this such as Advil, Motrin, or Aleve.   We have prescribed you new medication(s) today. Discuss the medications prescribed today with your pharmacist as they can have adverse effects and interactions with your other medicines including over the counter and prescribed medications. Seek medical evaluation if you start to experience new or abnormal symptoms after taking one of these medicines, seek care immediately if you start to experience difficulty breathing, feeling of your throat closing, facial swelling, or rash as these could be indications of a more serious allergic reaction   Follow-up with your primary care doctor in 1 week for reevaluation of your symptoms.  Return to the ER for new or worsening symptoms or any other concerns that you may have.

## 2017-11-01 ENCOUNTER — Emergency Department (HOSPITAL_COMMUNITY)
Admission: EM | Admit: 2017-11-01 | Discharge: 2017-11-02 | Disposition: A | Discharge status: Home / Self Care | Payer: Medicaid Other | Attending: Emergency Medicine | Admitting: Emergency Medicine

## 2017-11-01 ENCOUNTER — Encounter (HOSPITAL_COMMUNITY): Payer: Self-pay | Admitting: Nurse Practitioner

## 2017-11-01 ENCOUNTER — Other Ambulatory Visit: Payer: Self-pay

## 2017-11-01 DIAGNOSIS — F17210 Nicotine dependence, cigarettes, uncomplicated: Secondary | ICD-10-CM | POA: Insufficient documentation

## 2017-11-01 DIAGNOSIS — M5441 Lumbago with sciatica, right side: Secondary | ICD-10-CM | POA: Insufficient documentation

## 2017-11-01 DIAGNOSIS — I10 Essential (primary) hypertension: Secondary | ICD-10-CM | POA: Insufficient documentation

## 2017-11-01 DIAGNOSIS — M545 Low back pain: Secondary | ICD-10-CM | POA: Diagnosis present

## 2017-11-01 DIAGNOSIS — Z79899 Other long term (current) drug therapy: Secondary | ICD-10-CM | POA: Insufficient documentation

## 2017-11-01 DIAGNOSIS — G8929 Other chronic pain: Secondary | ICD-10-CM

## 2017-11-01 DIAGNOSIS — E119 Type 2 diabetes mellitus without complications: Secondary | ICD-10-CM | POA: Insufficient documentation

## 2017-11-01 MED ORDER — NAPROXEN 500 MG PO TABS
500.0000 mg | ORAL_TABLET | Freq: Once | ORAL | Status: AC
  Administered 2017-11-01: 500 mg via ORAL
  Filled 2017-11-01: qty 1
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
## 2017-11-05 ENCOUNTER — Encounter (HOSPITAL_COMMUNITY): Payer: Self-pay

## 2017-11-05 ENCOUNTER — Emergency Department (HOSPITAL_COMMUNITY)
Admission: EM | Admit: 2017-11-05 | Discharge: 2017-11-05 | Disposition: A | Discharge status: Home / Self Care | Payer: Medicaid Other | Source: Home / Self Care

## 2017-11-05 ENCOUNTER — Emergency Department (HOSPITAL_COMMUNITY): Payer: Medicaid Other

## 2017-11-05 ENCOUNTER — Emergency Department (HOSPITAL_COMMUNITY)
Admission: EM | Admit: 2017-11-05 | Discharge: 2017-11-05 | Discharge status: Against Medical Advice | Payer: Medicaid Other | Attending: Emergency Medicine | Admitting: Emergency Medicine

## 2017-11-05 ENCOUNTER — Emergency Department (HOSPITAL_COMMUNITY)
Admission: EM | Admit: 2017-11-05 | Discharge: 2017-11-06 | Disposition: A | Discharge status: Home / Self Care | Payer: Medicaid Other | Source: Home / Self Care | Attending: Emergency Medicine | Admitting: Emergency Medicine

## 2017-11-05 ENCOUNTER — Other Ambulatory Visit: Payer: Self-pay

## 2017-11-05 DIAGNOSIS — E119 Type 2 diabetes mellitus without complications: Secondary | ICD-10-CM

## 2017-11-05 DIAGNOSIS — F17290 Nicotine dependence, other tobacco product, uncomplicated: Secondary | ICD-10-CM | POA: Insufficient documentation

## 2017-11-05 DIAGNOSIS — Y999 Unspecified external cause status: Secondary | ICD-10-CM

## 2017-11-05 DIAGNOSIS — F17210 Nicotine dependence, cigarettes, uncomplicated: Secondary | ICD-10-CM

## 2017-11-05 DIAGNOSIS — M79672 Pain in left foot: Secondary | ICD-10-CM

## 2017-11-05 DIAGNOSIS — I10 Essential (primary) hypertension: Secondary | ICD-10-CM

## 2017-11-05 DIAGNOSIS — Y929 Unspecified place or not applicable: Secondary | ICD-10-CM

## 2017-11-05 DIAGNOSIS — F319 Bipolar disorder, unspecified: Secondary | ICD-10-CM

## 2017-11-05 DIAGNOSIS — Z5321 Procedure and treatment not carried out due to patient leaving prior to being seen by health care provider: Secondary | ICD-10-CM | POA: Diagnosis not present

## 2017-11-05 DIAGNOSIS — Y9301 Activity, walking, marching and hiking: Secondary | ICD-10-CM

## 2017-11-05 NOTE — ED Notes (Signed)
Pt was called to triage several times with no answer. Returned with [employer] bag, when called stated to wait he was eating. Tried again pt still told tech to wait. At this time asking Security to escort pt off premises.

## 2017-11-05 NOTE — ED Triage Notes (Addendum)
Pt states leg and back pain caused him to fall. Pt eating [employer] en route with no complaints
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
## 2017-11-05 NOTE — ED Notes (Signed)
Pt called for a room x 2, no answer. 

## 2017-11-05 NOTE — ED Notes (Signed)
Pt called x1 for room, no answer  ?

## 2017-11-05 NOTE — ED Notes (Signed)
Pt called for room x3, no answer 

## 2017-11-06 MED ORDER — IBUPROFEN 200 MG PO TABS
600.0000 mg | ORAL_TABLET | Freq: Once | ORAL | Status: AC
  Administered 2017-11-06: 600 mg via ORAL
  Filled 2017-11-06: qty 3

## 2017-11-06 NOTE — ED Notes (Signed)
Pt has taken all of his items out of his bags and strewn them over the floor, has removed multiple pairs of gloves and placed in personal items.
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
## 2018-06-01 DIAGNOSIS — M545 Low back pain: Secondary | ICD-10-CM

## 2018-06-01 NOTE — ED Notes (Signed)
 57 y/o male wheeled to ED via EMS c/o back pain due to injury while lifting a heavy box at work today.
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
## 2018-06-01 NOTE — ED Triage Notes (Signed)
56 y/o male wheeled to ED via EMS c/o back pain due to injury while "lifting a heavy box" at work today.
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
## 2018-06-02 ENCOUNTER — Inpatient Hospital Stay
Admit: 2018-06-02 | Discharge: 2018-06-02 | Disposition: A | Discharge status: Home / Self Care | Payer: PRIVATE HEALTH INSURANCE | Attending: Emergency Medicine

## 2018-06-02 MED ORDER — ACETAMINOPHEN 500 MG TAB
500 mg | ORAL | Status: AC
Start: 2018-06-02 — End: 2018-06-01
  Administered 2018-06-02: 03:00:00 via ORAL

## 2018-06-02 MED ORDER — LIDOCAINE 4 % TOPICAL PATCH (12 HOUR DURATION)
4 % | CUTANEOUS | Status: DC
Start: 2018-06-02 — End: 2018-06-02

## 2018-06-02 MED FILL — ASPERCREME (LIDOCAINE) 4 % TOPICAL PATCH: 4 % | CUTANEOUS | Qty: 1

## 2018-06-02 MED FILL — MAPAP EXTRA STRENGTH 500 MG TABLET: 500 mg | ORAL | Qty: 2

## 2018-06-27 ENCOUNTER — Emergency Department: Admit: 2018-06-28 | Payer: PRIVATE HEALTH INSURANCE | Primary: Family Medicine

## 2018-06-27 DIAGNOSIS — S032XXA Dislocation of tooth, initial encounter: Secondary | ICD-10-CM

## 2018-06-27 NOTE — ED Notes (Signed)
Patient standing in hall urinating in urinal attempting to engage staff and other patients in conversation.  Patient returned to bed without incident, resting comfortably, NAD.

## 2018-06-27 NOTE — ED Notes (Signed)
Patient resting comfortably , NAD
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
## 2018-06-27 NOTE — ED Notes (Signed)
Patient resting comfortably, NAD.
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
## 2018-06-27 NOTE — ED Notes (Signed)
Patient standing in hall urinating in urinal attempting to engage staff and other patients in conversation.  Patient returned to bed without incident, resting comfortably, NAD.
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
## 2018-06-28 ENCOUNTER — Inpatient Hospital Stay
Admit: 2018-06-28 | Discharge: 2018-06-28 | Disposition: A | Discharge status: Home / Self Care | Payer: PRIVATE HEALTH INSURANCE | Attending: Emergency Medicine

## 2018-06-28 LAB — GLUCOSE, POC: Glucose (POC): 111 mg/dL — ABNORMAL HIGH (ref 74–106)

## 2018-06-28 LAB — POCT GLUCOSE: POC Glucose: 111 mg/dL — ABNORMAL HIGH (ref 74–106)

## 2018-06-28 NOTE — ED Notes (Signed)
11:00 PM    The patient was signed out to me and care transferred to me by the outgoing provider Dr. Excell Seltzerooper, Daa'Iyah, MD at the beginning of my shift.    Briefly, the patient is a 57 y.o. year old male presenting with small frontal hematoma, two loose teeth, and back pain following a fall.     At this time will re-adjust the patient's pain control and re-assess.    The following items were pending at the time of sign out and discussed with the outgoing provider:    []  Labs  []  Xrays  []  Ultrasound  CT: []  Head   []  Neck   []  Chest   []  Abd/Pelvis    []  Extremity   []  C,T, or L spine  []  Other:   ---------------------------------------------------------------  []  Psych Eval  []  Sobriety  [x]  Symptomatic improvement  []  Transportation  []  Other:        Plan for disposition is:     []  Discharge   []  Likely Discharge []  Admit  []  Likely Admit  [x]  Pending   []  Transfer or Likely Transfer           []  This patient is in critical condition and was signed out at bedside; a full plan,                 dispo, and discussion of the patient was provided to the oncoming provider.     Diagnostic Studies:    Lab Data:  Labs Reviewed - No data to display  Labs Reviewed - No data to display      ED Course:   1:36 AM  Pt states he fell when he did not have his cane with him, and sometimes looses his balance. Denies dizziness. Upset about personal family issues. Requests to be discharge to home. No acute issues at this time. Was able to tolerate po.   Advised pt to f/u with dental clinic for avulsion of central incisors. Return to ED if pain, loc, or chest pain.    Patient educated on pertinent findings and treatment plan. Patient was given the opportunity to ask questions and voiced understanding and agreement with the treatment plan. I provided verbal and written anticipatory guidance, instructions for return to the ED, and instructions for follow up. Patient is stable for discharge.    1. Fall, initial encounter    2. Avulsed  tooth, initial encounter        Plan: Discharge Patient with written instructions. Medications for this visit   No current facility-administered medications for this encounter.      No current outpatient medications on file.   .Marland Kitchen

## 2018-06-28 NOTE — ED Notes (Signed)
Patient sitting up in bed, listening to gospel music on his phone.  No signs of distress.

## 2018-06-28 NOTE — ED Notes (Signed)
I have reviewed discharge instructions with the patient.  The patient verbalized understanding.    After receiving discharge instructions, patient proceeded to pilfer medical supplies from cabinet in room.  Patient was asked to return said supplies but declined.  Patient ambulated from ED with a steady gait and in posession of hospital property.

## 2018-06-28 NOTE — ED Notes (Signed)
I have reviewed discharge instructions with the patient.  The patient verbalized understanding.    After receiving discharge instructions, patient proceeded to pilfer medical supplies from cabinet in room.  Patient was asked to return said supplies but declined.  Patient ambulated from ED with a steady gait and in posession of hospital property.

## 2018-06-28 NOTE — ED Notes (Signed)
11:00 PM    The patient was signed out to me and care transferred to me by the outgoing provider Dr. Excell Seltzerooper, Daa'Iyah, MD at the beginning of my shift.    Briefly, the patient is a 57 y.o. year old male presenting with small frontal hematoma, two loose teeth, and back pain following a fall.     At this time will re-adjust the patient's pain control and re-assess.    The following items were pending at the time of sign out and discussed with the outgoing provider:    []  Labs  []  Xrays  []  Ultrasound  CT: []  Head   []  Neck   []  Chest   []  Abd/Pelvis    []  Extremity   []  C,T, or L spine  []  Other:   ---------------------------------------------------------------  []  Psych Eval  []  Sobriety  [x]  Symptomatic improvement  []  Transportation  []  Other:        Plan for disposition is:     []  Discharge   []  Likely Discharge []  Admit  []  Likely Admit  [x]  Pending   []  Transfer or Likely Transfer           []  This patient is in critical condition and was signed out at bedside; a full plan,                 dispo, and discussion of the patient was provided to the oncoming provider.     Diagnostic Studies:    Lab Data:  Labs Reviewed - No data to display  Labs Reviewed - No data to display      ED Course:   1:36 AM  Pt states he fell when he did not have his cane with him, and sometimes looses his balance. Denies dizziness. Upset about personal family issues. Requests to be discharge to home. No acute issues at this time. Was able to tolerate po.   Advised pt to f/u with dental clinic for avulsion of central incisors. Return to ED if pain, loc, or chest pain.    Patient educated on pertinent findings and treatment plan. Patient was given the opportunity to ask questions and voiced understanding and agreement with the treatment plan. I provided verbal and written anticipatory guidance, instructions for return to the ED, and instructions for follow up. Patient is stable for discharge.    1. Fall, initial encounter     2. Avulsed tooth, initial encounter        Plan: Discharge Patient with written instructions. Medications for this visit   No current facility-administered medications for this encounter.      No current outpatient medications on file.   .Marland Kitchen

## 2018-06-28 NOTE — ED Notes (Signed)
Patient sitting up in bed, listening to gospel music on his phone.  No signs of distress.

## 2018-06-29 DIAGNOSIS — S61511D Laceration without foreign body of right wrist, subsequent encounter: Secondary | ICD-10-CM

## 2018-06-29 NOTE — ED Notes (Signed)
Pt is currently putting on clothes and packing belongings to get ready for discharge.

## 2018-06-29 NOTE — ED Notes (Signed)
 Ems pt. States non-compliance with  psyche meds  since  nov. 27 , 2019  also pinpoint pupils dropping w/c accessories / somnolent

## 2018-06-29 NOTE — ED Notes (Signed)
I have reviewed discharge instructions with the patient.  The patient verbalized understanding. Pt wheeled out to the waiting room. NAD noted. VSS.
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
## 2018-06-29 NOTE — ED Notes (Signed)
 AUDIT Screen Score:  0      Document Brief Intervention  57 y.o.  patient referred to SBIRT via in basket for suspected/identified drug overdose or drug use. Patient will not actively engage with SBIRT at this time evident by his report of refusing services. Patient declined further intervention or follow-up.              Is Patient ready to commit to change? Patient refused services.      . Encouraged patient to reduce use of heroin and seek treatment when ready.  . Discuss short term and long term health risks of consuming heroin.  SABRA Reaffirm willingness to help / Educational materials and contact info was provided.

## 2018-06-29 NOTE — ED Triage Notes (Signed)
Ems pt. States non-compliance with " psyche meds " since " nov. 27 , 2019 " also pinpoint pupils dropping w/c accessories / somnolent
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
## 2018-06-29 NOTE — ED Notes (Signed)
AUDIT Screen Score:  0      Document Brief Intervention  57 y.o.  patient referred to SBIRT via in basket for suspected/identified drug overdose or drug use. Patient will not actively engage with SBIRT at this time evident by his report of refusing services. Patient declined further intervention or follow-up.              Is Patient ready to commit to change? Patient refused services.      ? Encouraged patient to reduce use of heroin and seek treatment when ready.  ? Discuss short term and long term health risks of consuming heroin.  ? Reaffirm willingness to help / Educational materials and contact info was provided.

## 2018-06-29 NOTE — ED Notes (Signed)
Pt is currently putting on clothes and packing belongings to get ready for discharge.

## 2018-06-29 NOTE — ED Notes (Signed)
I have reviewed discharge instructions with the patient.  The patient verbalized understanding. Pt wheeled out to the waiting room. NAD noted. VSS.

## 2018-06-30 ENCOUNTER — Inpatient Hospital Stay
Admit: 2018-06-30 | Discharge: 2018-06-30 | Disposition: A | Discharge status: Home / Self Care | Payer: PRIVATE HEALTH INSURANCE | Attending: Pediatric Emergency Medicine

## 2018-06-30 MED ORDER — LIDOCAINE 4 % TOPICAL PATCH (12 HOUR DURATION)
4 % | CUTANEOUS | Status: DC
Start: 2018-06-30 — End: 2018-06-30

## 2018-06-30 MED FILL — ASPERCREME (LIDOCAINE) 4 % TOPICAL PATCH: 4 % | CUTANEOUS | Qty: 1

## 2018-07-05 ENCOUNTER — Emergency Department: Admit: 2018-07-05 | Payer: PRIVATE HEALTH INSURANCE | Primary: Family Medicine

## 2018-07-05 ENCOUNTER — Inpatient Hospital Stay
Admit: 2018-07-05 | Discharge: 2018-07-05 | Disposition: A | Discharge status: Home / Self Care | Payer: PRIVATE HEALTH INSURANCE | Attending: Emergency Medicine

## 2018-07-05 DIAGNOSIS — S0990XA Unspecified injury of head, initial encounter: Secondary | ICD-10-CM

## 2018-07-05 NOTE — ED Provider Notes (Signed)
ED Provider Notes by Bridget Hartshornizvi, Syma H, PA at 07/05/18 1235                Author: Bridget Hartshornizvi, Syma H, PA  Service: Emergency Medicine  Author Type: Physician Assistant       Filed: 07/05/18 2119  Date of Service: 07/05/18 1235  Status: Signed           Editor: Bridget Hartshornizvi, Syma H, PA (Physician Assistant)  Cosigner: Harriett Sineran, Quincy K, MD at 07/15/18 2045               Darryl Atkinson is a 57 y.o. male with h/o bipolar 1 disorder, HTN, DM, and Psychiatric disorder who presents to  the ED via EMS with complaint of a fall PTA. Associated Sx include back pain. The patient states that he fell out of his wheel chair hitting his head causing him to "black out". EMS was called to the scene by a bystander and they transported him to the  ED. He notes that 2 days ago he hit his head and was seen at [facility] where he had a CT head done and stitches to his left eye brow. Patients last drug usage was 06/14/2018. He denies being on blood thinners, HA, and N/V/D.             The history is provided by the patient.    Fall    The  accident occurred less than 1 hour ago. He fell from a height of ground level. The point of impact was the head. The pain is at a severity of 5/10.  The pain is mild. Associated symptoms include loss of consciousness. Pertinent negatives include no fever, no numbness, no abdominal pain, no nausea, no vomiting and no headaches. He has tried nothing for the symptoms.             Past Medical History:        Diagnosis  Date         ?  Bipolar 1 disorder (HCC)       ?  Hypertension           ?  Psychiatric disorder             History reviewed. No pertinent surgical history.        History reviewed. No pertinent family history.        Social History          Socioeconomic History         ?  Marital status:  SINGLE              Spouse name:  Not on file         ?  Number of children:  Not on file     ?  Years of education:  Not on file     ?  Highest education level:  Not on file       Occupational  History        ?  Not on file       Social Needs         ?  Financial resource strain:  Not on file        ?  Food insecurity:              Worry:  Not on file         Inability:  Not on file        ?  Transportation needs:  Medical:  Not on file         Non-medical:  Not on file       Tobacco Use         ?  Smoking status:  Current Every Day Smoker     ?  Smokeless tobacco:  Never Used       Substance and Sexual Activity         ?  Alcohol use:  Yes     ?  Drug use:  Yes              Types:  Heroin             Comment: last use days ago 06/2018         ?  Sexual activity:  Not on file       Lifestyle        ?  Physical activity:              Days per week:  Not on file         Minutes per session:  Not on file         ?  Stress:  Not on file       Relationships        ?  Social connections:              Talks on phone:  Not on file         Gets together:  Not on file         Attends religious service:  Not on file         Active member of club or organization:  Not on file         Attends meetings of clubs or organizations:  Not on file         Relationship status:  Not on file        ?  Intimate partner violence:              Fear of current or ex partner:  Not on file         Emotionally abused:  Not on file         Physically abused:  Not on file         Forced sexual activity:  Not on file        Other Topics  Concern        ?  Not on file       Social History Narrative        ?  Not on file              ALLERGIES: Haldol [haloperidol lactate]      Review of Systems    Constitutional: Negative for chills, fatigue and fever.    HENT: Negative for congestion, ear discharge, facial swelling, rhinorrhea, sinus pressure, sore throat and trouble swallowing.     Respiratory: Negative for cough, chest tightness and shortness of breath.     Cardiovascular: Negative for chest pain, palpitations and leg swelling.    Gastrointestinal: Negative for abdominal pain, blood in stool, diarrhea, nausea and vomiting.     Genitourinary: Negative for flank pain.    Musculoskeletal: Positive for back pain and gait problem  (patient ambulates with a cane but is unsteady). Negative for arthralgias, myalgias, neck pain and neck stiffness.    Neurological: Positive for loss of consciousness. Negative for dizziness, speech difficulty, weakness, numbness and headaches.  Vitals:          07/05/18 1219        BP:  (!) 166/103     Pulse:  96     Resp:  18     Temp:  98.4 ??F (36.9 ??C)     SpO2:  100%     Weight:  94.8 kg (209 lb)        Height:  5\' 9"  (1.753 m)                Physical Exam   Vitals signs and nursing note reviewed.   Constitutional:        General: He is not in acute distress.     Appearance: He is well-developed. He is not diaphoretic.    HENT:       Head: Normocephalic. Laceration present.          Right Ear: Tympanic membrane normal.      Left Ear: Tympanic membrane normal.      Nose: No congestion or rhinorrhea.   Eyes:       Conjunctiva/sclera: Conjunctivae normal.      Pupils: Pupils are equal, round, and reactive to light.    Neck:       Musculoskeletal: Neck supple.   Cardiovascular :       Rate and Rhythm: Normal rate.      Heart sounds: Normal heart sounds. No murmur.    Pulmonary:       Effort: Pulmonary effort is normal. No respiratory distress.      Breath sounds: Normal breath sounds.    Abdominal:      Palpations: Abdomen is soft.      Tenderness: There is no tenderness.     Musculoskeletal: Normal range of motion.          General: Tenderness present. No swelling or deformity.      Cervical back: Normal. He exhibits no bony tenderness.      Thoracic  back: Normal. He exhibits no bony tenderness.      Lumbar back: He exhibits tenderness. He exhibits no bony tenderness.        Back:         Comments: right sided weakness dtr wnl  Sensation normal   nvi   Unsteady gait     Skin:      General: Skin is warm and dry.      Capillary Refill: Capillary refill takes less than 2 seconds.    Neurological:        General: No focal deficit present.      Mental Status: He is alert and oriented to person, place, and time.      Cranial Nerves: No cranial nerve deficit.      Sensory: No sensory deficit.      Coordination: Coordination normal.       Deep Tendon Reflexes: Reflexes normal.      Comments: Unsteady Gait with cane    Psychiatric:         Mood and Affect: Mood normal.         Behavior: Behavior  normal.              MDM   Number of Diagnoses or Management Options   Fall, initial encounter:    Injury of head, initial encounter:    Diagnosis management comments: 57 y.o. male with h/o  Chronic back pain, seizures, who presents to the ED sp fall this morning presenting with lower back  pain, he hit his head on concrete after  falling out of wheelchair. Per patient and EMS the patient "blacked out" so he was brought to the ED. The patient was seen here on 06/29/2018. He ambulates with a cane with an unsteady gait and he has a wheel chair. Patient states [facility] days ago for a lac to the left eye brow and he has two sutures in place. Patient denies alcohol and drug use, cp , sob, N/v/d , HA, or dizziness .      Plan:    CT Head without contrast   Fall   PT eval   Referral given for PT outpatient   pmd f/u     -----   1:20 PM   Documented by Virgie Dadashonda Vandehei, acting as a scribe for Benson SettingSyma Rizvi, PA         PROVIDER ATTESTATION:   9:18 PM      The entirety of this note, signed by me, accurately reflects all works, treatments, procedures, and medical decision making performed by me, Bridget HartshornSyma H Rizvi, PA.                  Patient Progress   Patient progress: stable             Procedures         Diagnostic Studies:   CT Scan 2:13 PM        [x]   Head []   C-spine  []   Chest   []    Abd/Pelvis   []    Other:          [x]   Viewed by me    []   Interpreted by me   [x]   No acute disease   []  Abnormal: see below  []    Discussed with Radiologist        Interpretation: Normal exam.   Lab Data:   Labs Reviewed - No data to  display         ED Course:          1:38 PM    Reviewed past CT and Xray from 06/27/2018.      1:56 PM   Patient had a mechanical fall from the bed  Get his belongings he dropped on the floor, no complaints.       2:14 PM   Have called PT for a consult with no answer. Will call again      2:39 PM   Kirt BoysMolly, RN called PT and they will come down to evaluate the patient       3:24 PM   Physical therapy has arrived to evaluate the patient.       3:44 PM   Outpatient therapy recommended to discharge, weak on the right side but he's safe enough to ambulate into wheelchair,he can sit down other wise moving all extremities weight gain is not a concern.

## 2018-07-05 NOTE — ED Notes (Signed)
Pt given food tray. Pt dropped food tray on ground. Side rails up on both sides of bed. Pt attempted to grab food tray with one hand on stretcher and one on wheelchair that was not locked. Wheel chair moved pt lowered himself to floor. No injury Charge made aware.

## 2018-07-05 NOTE — ED Notes (Signed)
Pt up out of the stretcher again. Will not cooperate and use the call bell provided as instructed previously. He wants to go outside and smoke. POC is pending P.T. evaluation.

## 2018-07-05 NOTE — ED Notes (Signed)
Pt to ED for fall today from wheelchair. Pt states no loc. Pt has been to multiple hospitals recently for same complaint. Pt has small lac above left brow. Pt has several bags of belongings

## 2018-07-05 NOTE — ED Notes (Signed)
 AUDIT Screen Score:  0      Document Brief Intervention ): 57 y.o.  patient referred to [facility] via in basket due to screening for suspected/identified drug use (heroin). Patient will actively engage with [facility] at this time evident by his report of request for inpatient services. 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 [facility] referred the patient to [facility] . The patient was transported by [facility] upon discharge today 07/05/18. [facility] provided supportive/motivating reinforcements by encouraging reflection, discussing short term and long term health risks of using illicit drugs, identifying barriers of change and reaffirming willingness to help along with provided educational materials and contact information for the [facility] office.                    Is Patient ready to commit to change? Yes:        . Encouraged reflection and reduce of use.  . Discuss short term and long term health risks of continuing to use heroin.  . Barriers to changing and moving into solutions.  . Reaffirmed [facility]'s willingness to help  / Educational materials provided and contact information for [facility].  SABRA

## 2018-07-05 NOTE — ED Notes (Signed)
Patient arrived in ED via EMS for a fall. Per EMS, " The patient complained of back pain from the fall:. The patient reported his wheelchair fell backwards and he hit his head. The patient denies LOC.

## 2018-07-05 NOTE — Progress Notes (Signed)
Physical Therapy Initial Evaluation:    Orders reviewed, chart reviewed, and initial evaluation completed on Darryl Atkinson.    Chief complaint: s/p fall  Date of onset: today  Mechanism of injury: unclear, w/u in progress. Patient apparently has had a number of fairly recent falls/blackouts with negative workups. He states he fell backward in his wheelchair while he was lifting some weights.  I questioned him further on this but still could not get a clear picture of what happened.   Pre-Morbid functionality: w/c dependent. Previously had a rollator but lost it when he was hospitalized. Patient states he stays in an apt on 2nd floor, but other sources indicate he is homeless.   Previous Therapies: unknown  Occupation: none, disabled     Patient presents at an overall level of assistance of supervision with basic functional mobility. Results of the evaluation consist of:       ROM: limited AROM on right side, especially shoulder and hip. AROM left side mostly functional.  Strength: Left UE 3+/5 proximally, 4 to 4+/5 distally. LLE 3/5 hip, 4-/5 knee and ankle dorsiflexion. RUE 2+/5 proximally, 2+ to 3-/5 distally. RLE 2+ to 3-/5 hip and knee, no active ankle dorsiflexion demonstrated, though he had a large, high-top heavy shoe which would make movement more difficult.      He moved sit to stand independently with heavy UE support, same for transfers.   He has tendency for uncontrolled, ballistic movements too fast to control  He ambulated with RW 15 feet safely but didn't like it because it was too slow.  Deficient functional reach  Romberg test failed     Based on this evaluation, the patient is capable of safe transfers and w/c management, but is loathe to take advice and slow down and move with caution. He has a wheelchair in his possession and can manage safely in it.    A supervised living situation is recommended for this patient. He could manage in a shelter or in a handicap-accessible building            Goals: None    Treatment Plan: D/C PT       Thanks for the consult.  Therapist: Arturo Mortonhomas D. Jarrett SohoVaeth, PT    4:10-4:35 p.m.

## 2018-07-05 NOTE — Progress Notes (Signed)
 Readmission Risk Screening      Name: Darryl Atkinson    Date:07/05/2018   Room #:       Column 1 Column 2 Column 3    "Do/Have/Are you" Low Risk Moderate Risk High Risk       Row 1   Living Situation have a live-in caregiver to assist you  OR  live alone with community support  []                                     live alone with limited community support  [x]                                   live alone with no community support  OR  have live-in family who aren't actively involved in your care  []                                     Row 2 Physician have a  Primary Care  doctor [x]   Does not know the name of his PCP                                N/A []                                   NOT have a Primary Care doctor (4pts) []                                     Row 3 Insurance insured [x]                                   N/A []                                   uninsured (4pts) []                                     Row 4 Activities of Daily Living (ADL/IADL) (ADls): groom, feed, dress, and get to the bathroom independently  (IADls): shop, budget, keep house, travel, manage medications independently  [x]                                     require temporary or regular assistance with ADls or IADls  []                                     require considerable assistance with ADls or IADls  []                                     Row 5  Disease Mgmt feel capable to manage your chronic illness and follow  your  doctor's orders independently  [x]                                   N/A []                                   require considerable assistance managing your chronic illness and other medical needs (clinically complex) []                                     Row 6 Meds Mgmt feel capable to manage your medications independently [x]                                   require some assistance managing your medication []                                   require considerable assistance managing your medications []                                      Row 7 Polypharmacy N/A []              take> 7 different medications at home []              N/A []              Row 8 Cognitive Ability feel capable to make your own  health care decisions [x]                                   sometimes have difficulty making your own health care decisions []                                   feel incapable to make your own health care decisions (cognitive impairment) []                                     Row 9 CHF N/A [x]                                   N/A []                                   have Congestive Heart Failure (4pts)  []                                     Row 10 COPD N/A [x]   N/A []                                   have COPD (4pts) []                                     Row 11 Diabetes N/A []                                   N/A []                                   have Diabetes (4pts)  [x]                                     Row 12 HIV/AIDS N/A [x]                                   N/A []                                   have HIVor AIDS (4pts) []                                     Row 13 Fall Risk N/A []                                   N/A []                                   have a history of falls (4pts) [x]                                     Row 14 Wound Care N/A [x]                                   N/A []                                   wounds or pressure ulcers (4pts) []                                     Row 15 Alcohol N/A [x]                                   N/A []   had> 4 alcoholic drinks/day during last 12 months (4pts)  []                                     Row 16 Drugs N/A []              N/A []              used marijuana, cocaine, heroin, or anything else to get high during last 12 months (4pts) [x]              Row 17 Mental Health N/A []                                   have a history of mental illness []                                   have a history of mental illness (4pts) [x]                                      Row 18 Readmission? N/A []                                   N/A []                                   been hospitalized in the last 30 days (4pts)  [x]                                     Row 19 ACO/MSSP N/A [x]                                   N/A []                                   enrolled in the Medicare Shared Savings Program(4pts) []                                      Totals:27 6     (Low Risk)                                 1           (Moderate Risk)                           20                 (?4 =High Risk)                          Screening completed by: Rock ONEIDA Crest, 07/05/2018

## 2018-07-05 NOTE — ED Notes (Signed)
Discharge instructions given with 0 prescriptions. Pt to follow up with his PCP or return if symptoms worsen. All questions were answered at this time, pt verbalized understanding. Pt condition is stable.

## 2018-07-05 NOTE — Progress Notes (Signed)
Care Management Interventions  PCP Verified by CM: No(He states [facility])  Mode of Transport at Discharge: Self  Current Support Network: Other  The Patient and/or Patient Representative was Provided with a Choice of Provider and Agrees with the Discharge Plan?: Yes  Freedom of Choice List was Provided with Basic Dialogue that Supports the Patient's Individualized Plan of Care/Goals, Treatment Preferences and Shares the Quality Data Associated with the Providers?: Yes  Discharge Location  Discharge Placement: Home  Darryl Atkinson is a 57 year old male who [facility] with complaint of having hit his head.  He states he had a fall last week and was [facility].  He says he does not know the name of his PCP.  He list as Harriette Bouillonatrick Lukasik, his brother as his emergency contact [phone]  SW / CM will continue to follow him until he is ready for discharge.

## 2018-07-05 NOTE — ED Notes (Signed)
Physical Therapy currently at bedside.

## 2018-07-05 NOTE — ED Notes (Signed)
 Post Fall Documentation      Darryl Atkinson witnessed/unwitnessed fall occurred on *07/05/18* (Date) at 1400 (Time).     The answers to the following questions summarize the fall:     In the patient's own words,:   What were you attempting to do when you fell?  eating   Do you know why you fell? Because his legs are weak   Do you have any pain/discomfort or any other complaints?  no   Which part of your body made contact with the floor or other object?  buttock    Nurse:  . Was this an assisted fall? yes  . Was fall witnessed? Yes     By Whom?molly brown  . If witnessed, what part of the body made contact with the floor or other object?  buttock  . Patient's mental status after the fall/when found: Alert and oriented  . Any apparent injury:  No apparent injury  . Immediate interventions for injury/suspected injury? No interventions needed  . Patient assisted back to bed? Assist X1  . Name of provider notified and time, any comments? MD Nivia       . Name of family member notified and time: NA      Immediate VS and physical assessment documented in flow sheets.    Neuro assessment every hour x 4 (for potential head injury or unwitnessed fall) documented in flow sheets.      Geofm Daring

## 2018-07-05 NOTE — Progress Notes (Signed)
Readmission Risk Screening      Name: Darryl Atkinson    Date:07/05/2018   Room #:       Column 1 Column 2 Column 3    ???Do/Have/Are you??? Low Risk Moderate Risk High Risk       Row 1   Living Situation have a live-in caregiver to assist you  OR  live alone with community support  []                                     live alone with limited community support  [x]                                   live alone with no community support  OR  have live-in family who aren't actively involved in your care  []                                     Row 2 Physician have a  Primary Care  doctor [x]   Does not know the name of his PCP                                N/A []                                   NOT have a Primary Care doctor (4pts) []                                     Row 3 Insurance insured [x]                                   N/A []                                   uninsured (4pts) []                                     Row 4 Activities of Daily Living (ADL/IADL) (ADls): groom, feed, dress, and get to the bathroom independently  (IADls): shop, budget, keep house, travel, manage medications independently  [x]                                     require temporary or regular assistance with ADls or IADls  []                                     require considerable assistance with ADls or IADls  []                                     Row 5  Disease Mgmt feel capable to manage your chronic illness and follow  your  doctor's orders independently  [x]                                   N/A []                                   require considerable assistance managing your chronic illness and other medical needs (clinically complex) []                                     Row 6 Meds Mgmt feel capable to manage your medications independently [x]                                   require some assistance managing your medication []                                   require considerable  assistance managing your medications []                                     Row 7 Polypharmacy N/A []              take> 7 different medications at home []              N/A []              Row 8 Cognitive Ability feel capable to make your own  health care decisions [x]                                   sometimes have difficulty making your own health care decisions []                                   feel incapable to make your own health care decisions (cognitive impairment) []                                     Row 9 CHF N/A [x]                                   N/A []                                   have Congestive Heart Failure (4pts)  []                                     Row 10 COPD N/A [x]   N/A []                                   have COPD (4pts) []                                     Row 11 Diabetes N/A []                                   N/A []                                   have Diabetes (4pts)  [x]                                     Row 12 HIV/AIDS N/A [x]                                   N/A []                                   have HIVor AIDS (4pts) []                                     Row 13 Fall Risk N/A []                                   N/A []                                   have a history of falls (4pts) [x]                                     Row 14 Wound Care N/A [x]                                   N/A []                                   wounds or pressure ulcers (4pts) []                                     Row 15 Alcohol N/A [x]                                   N/A []   had> 4 alcoholic drinks/day during last 12 months (4pts)  []                                     Row 16 Drugs N/A []              N/A []              used marijuana, cocaine, heroin, or anything else to get "high" during last 12 months (4pts) [x]              Row 17 Mental Health N/A []                                   have a  history of mental illness []                                   have a history of mental illness (4pts) [x]                                     Row 18 Readmission? N/A []                                   N/A []                                   been hospitalized in the last 30 days (4pts)  [x]                                     Row 19 ACO/MSSP N/A [x]                                   N/A []                                   enrolled in the Medicare Shared Savings Program(4pts) []                                      Totals:27 6     (Low Risk)                                 1           (Moderate Risk)                           20                 (?4 =High Risk)                          Screening completed by: Shari ProwsLinda T Qualyn Oyervides, 07/05/2018

## 2018-07-05 NOTE — Progress Notes (Signed)
Care Management Interventions  PCP Verified by CM: No(He states he sees someone at [facility])  Mode of Transport at Discharge: Self  Current Support Network: Other  The Patient and/or Patient Representative was Provided with a Choice of Provider and Agrees with the Discharge Plan?: Yes  Freedom of Choice List was Provided with Basic Dialogue that Supports the Patient's Individualized Plan of Care/Goals, Treatment Preferences and Shares the Quality Data Associated with the Providers?: Yes  Discharge Location  Discharge Placement: Home  Darryl Atkinson is a 56 year old male who presented to [facility] on 07/05/2018 with complaint of having hit his head.  He states he had a fall last week and was seen at [facility].  He says he does not know the name of his PCP.  He list as [name], his brother as his emergency contact ([phone])  SW / CM will continue to follow him until he is ready for discharge.

## 2018-07-05 NOTE — ED Notes (Signed)
Pt to ED for fall today from wheelchair. Pt states no loc. Pt has been to multiple hospitals recently for same complaint. Pt has small lac above left brow. Pt has several bags of belongings

## 2018-07-05 NOTE — ED Triage Notes (Signed)
Patient arrived in ED via EMS for a fall. Per EMS, " The patient complained of back pain from the fall:. The patient reported his wheelchair fell backwards and he hit his head. The patient denies LOC.

## 2018-07-05 NOTE — ED Notes (Signed)
AUDIT Screen Score:  0      Document Brief Intervention ): 57 y.o.  patient referred to [facility] via in basket due to screening for suspected/identified drug use (heroin). Patient will actively engage with [facility] at this time evident by his report of request for inpatient services. 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 [facility] referred the [facility] to [facility]. The patient was transported by [facility] upon discharge today 07/05/18. [facility] provided supportive/motivating reinforcements by encouraging reflection, discussing short term and long term health risks of using illicit drugs, identifying barriers of change and reaffirming willingness to help along with provided educational materials and contact information for the [facility] office.                    Is Patient ready to commit to change? Yes:        ? Encouraged reflection and reduce of use.  ? Discuss short term and long term health risks of continuing to use heroin.  ? Barriers to changing and moving into solutions.  ? Reaffirmed [facility]'s willingness to help  / Transport plannerducational materials provided and contact information for [facility].  ?

## 2018-07-05 NOTE — ED Provider Notes (Signed)
Darryl Atkinson is a 57 y.o. male with h/o bipolar 1 disorder, HTN, DM, and Psychiatric disorder who presents to the ED via EMS with complaint of a fall PTA. Associated Sx include back pain. The patient states that he fell out of his wheel chair hitting his head causing him to "black out". EMS was called to the scene by a bystander and they transported him to the ED. He notes that 2 days ago he hit his head and was seen at [facility] where he had a CT head done and stitches to his left eye brow. Patients last drug usage was 06/14/2018. He denies being on blood thinners, HA, and N/V/D.         The history is provided by the patient.   Fall   The accident occurred less than 1 hour ago. He fell from a height of ground level. The point of impact was the head. The pain is at a severity of 5/10. The pain is mild. Associated symptoms include loss of consciousness. Pertinent negatives include no fever, no numbness, no abdominal pain, no nausea, no vomiting and no headaches. He has tried nothing for the symptoms.        Past Medical History:   Diagnosis Date   ??? Bipolar 1 disorder (HCC)    ??? Hypertension    ??? Psychiatric disorder        History reviewed. No pertinent surgical history.      History reviewed. No pertinent family history.    Social History     Socioeconomic History   ??? Marital status: SINGLE     Spouse name: Not on file   ??? Number of children: Not on file   ??? Years of education: Not on file   ??? Highest education level: Not on file   Occupational History   ??? Not on file   Social Needs   ??? Financial resource strain: Not on file   ??? Food insecurity:     Worry: Not on file     Inability: Not on file   ??? Transportation needs:     Medical: Not on file     Non-medical: Not on file   Tobacco Use   ??? Smoking status: Current Every Day Smoker   ??? Smokeless tobacco: Never Used   Substance and Sexual Activity   ??? Alcohol use: Yes   ??? Drug use: Yes     Types: Heroin     Comment: last use days ago 06/2018    ??? Sexual activity: Not on file   Lifestyle   ??? Physical activity:     Days per week: Not on file     Minutes per session: Not on file   ??? Stress: Not on file   Relationships   ??? Social connections:     Talks on phone: Not on file     Gets together: Not on file     Attends religious service: Not on file     Active member of club or organization: Not on file     Attends meetings of clubs or organizations: Not on file     Relationship status: Not on file   ??? Intimate partner violence:     Fear of current or ex partner: Not on file     Emotionally abused: Not on file     Physically abused: Not on file     Forced sexual activity: Not on file   Other Topics Concern   ???  Not on file   Social History Narrative   ??? Not on file         ALLERGIES: Haldol [haloperidol lactate]    Review of Systems   Constitutional: Negative for chills, fatigue and fever.   HENT: Negative for congestion, ear discharge, facial swelling, rhinorrhea, sinus pressure, sore throat and trouble swallowing.    Respiratory: Negative for cough, chest tightness and shortness of breath.    Cardiovascular: Negative for chest pain, palpitations and leg swelling.   Gastrointestinal: Negative for abdominal pain, blood in stool, diarrhea, nausea and vomiting.   Genitourinary: Negative for flank pain.   Musculoskeletal: Positive for back pain and gait problem (patient ambulates with a cane but is unsteady). Negative for arthralgias, myalgias, neck pain and neck stiffness.   Neurological: Positive for loss of consciousness. Negative for dizziness, speech difficulty, weakness, numbness and headaches.       Vitals:    07/05/18 1219   BP: (!) 166/103   Pulse: 96   Resp: 18   Temp: 98.4 ??F (36.9 ??C)   SpO2: 100%   Weight: 94.8 kg (209 lb)   Height: 5\' 9"  (1.753 m)            Physical Exam  Vitals signs and nursing note reviewed.   Constitutional:       General: He is not in acute distress.     Appearance: He is well-developed. He is not diaphoretic.   HENT:       Head: Normocephalic. Laceration present.        Right Ear: Tympanic membrane normal.      Left Ear: Tympanic membrane normal.      Nose: No congestion or rhinorrhea.   Eyes:      Conjunctiva/sclera: Conjunctivae normal.      Pupils: Pupils are equal, round, and reactive to light.   Neck:      Musculoskeletal: Neck supple.   Cardiovascular:      Rate and Rhythm: Normal rate.      Heart sounds: Normal heart sounds. No murmur.   Pulmonary:      Effort: Pulmonary effort is normal. No respiratory distress.      Breath sounds: Normal breath sounds.   Abdominal:      Palpations: Abdomen is soft.      Tenderness: There is no tenderness.   Musculoskeletal: Normal range of motion.         General: Tenderness present. No swelling or deformity.      Cervical back: Normal. He exhibits no bony tenderness.      Thoracic back: Normal. He exhibits no bony tenderness.      Lumbar back: He exhibits tenderness. He exhibits no bony tenderness.        Back:       Comments: right sided weakness dtr wnl  Sensation normal   nvi   Unsteady gait   Skin:     General: Skin is warm and dry.      Capillary Refill: Capillary refill takes less than 2 seconds.   Neurological:      General: No focal deficit present.      Mental Status: He is alert and oriented to person, place, and time.      Cranial Nerves: No cranial nerve deficit.      Sensory: No sensory deficit.      Coordination: Coordination normal.      Deep Tendon Reflexes: Reflexes normal.      Comments: Unsteady Gait with cane  Psychiatric:         Mood and Affect: Mood normal.         Behavior: Behavior normal.          MDM  Number of Diagnoses or Management Options  Fall, initial encounter:   Injury of head, initial encounter:   Diagnosis management comments: 57 y.o. male with h/o  Chronic back pain, seizures, who presents to the ED sp fall this morning presenting with lower back pain, he hit his head on concrete after falling out of  wheelchair. Per patient and EMS the patient "blacked out" so he was brought to the ED. The patient was seen here on 06/29/2018. He ambulates with a cane with an unsteady gait and he has a wheel chair. [facility] for a lac to the left eye brow and he has two sutures in place. Patient denies alcohol and drug use, cp , sob, N/v/d , HA, or dizziness .    Plan:   CT Head without contrast  Fall  PT eval  Referral given for PT outpatient  pmd f/u    -----  1:20 PM  Documented by Virgie Dadashonda Bellomo, acting as a scribe for Benson SettingSyma Irelynd Zumstein, PA      PROVIDER ATTESTATION:  9:18 PM    The entirety of this note, signed by me, accurately reflects all works, treatments, procedures, and medical decision making performed by me, Bridget HartshornSyma H Corderius Saraceni, PA.            Patient Progress  Patient progress: stable         Procedures      Diagnostic Studies:  CT Scan 2:13 PM  [x]  Head []  C-spine []  Chest  []   Abd/Pelvis  []   Other:   [x]  Viewed by me   []  Interpreted by me  [x]  No acute disease  []  Abnormal: see below []   Discussed with Radiologist     Interpretation: Normal exam.  Lab Data:  Labs Reviewed - No data to display      ED Course:       1:38 PM   Reviewed past CT and Xray from 06/27/2018.    1:56 PM  Patient had a mechanical fall from the bed  Get his belongings he dropped on the floor, no complaints.     2:14 PM  Have called PT for a consult with no answer. Will call again    2:39 PM  Kirt BoysMolly, RN called PT and they will come down to evaluate the patient     3:24 PM  Physical therapy has arrived to evaluate the patient.     3:44 PM  Outpatient therapy recommended to discharge, weak on the right side but he's safe enough to ambulate into wheelchair,he can sit down other wise moving all extremities weight gain is not a concern.

## 2018-07-05 NOTE — ED Notes (Signed)
Physical Therapy currently at bedside.

## 2018-07-05 NOTE — ED Notes (Signed)
Pt given food tray. Pt dropped food tray on ground. Side rails up on both sides of bed. Pt attempted to grab food tray with one hand on stretcher and one on wheelchair that was not locked. Wheel chair moved pt lowered himself to floor. No injury Charge made aware.

## 2018-07-05 NOTE — ED Notes (Signed)
Pt up out of the stretcher again. Will not cooperate and use the call bell provided as instructed previously. He wants to go outside and smoke. POC is pending P.T. evaluation.

## 2018-07-05 NOTE — Progress Notes (Addendum)
Physical Therapy Initial Evaluation:    Orders reviewed, chart reviewed, and initial evaluation completed on Darryl Atkinson.    Chief complaint: s/p fall  Date of onset: today  Mechanism of injury: unclear, w/u in progress. Patient apparently has had a number of fairly recent falls/blackouts with negative workups. He states he fell backward in his wheelchair while he was lifting some weights.  I questioned him further on this but still could not get a clear picture of what happened.   Pre-Morbid functionality: w/c dependent. Previously had a rollator but lost it when he was hospitalized. Patient states he stays in an apt on 2nd floor, but other sources indicate he is homeless.   Previous Therapies: unknown  Occupation: none, disabled     Patient presents at an overall level of assistance of supervision with basic functional mobility. Results of the evaluation consist of:       ROM: limited AROM on right side, especially shoulder and hip. AROM left side mostly functional.  Strength: Left UE 3+/5 proximally, 4 to 4+/5 distally. LLE 3/5 hip, 4-/5 knee and ankle dorsiflexion. RUE 2+/5 proximally, 2+ to 3-/5 distally. RLE 2+ to 3-/5 hip and knee, no active ankle dorsiflexion demonstrated, though he had a large, high-top heavy shoe which would make movement more difficult.      He moved sit to stand independently with heavy UE support, same for transfers.   He has tendency for uncontrolled, ballistic movements too fast to control  He ambulated with RW 15 feet safely but didn't like it because it was too slow.  Deficient functional reach  Romberg test failed     Based on this evaluation, the patient is capable of safe transfers and w/c management, but is loathe to take advice and slow down and move with caution. He has a wheelchair in his possession and can manage safely in it.    A supervised living situation is recommended for this patient. He could manage in a shelter or in a handicap-accessible building            Goals: None    Treatment Plan: D/C PT       Thanks for the consult.  Therapist: Elyas Villamor D. Jonmichael Beadnell, PT    4:10-4:35 p.m.

## 2018-07-05 NOTE — ED Notes (Signed)
Post Fall Documentation      Alessandra Groutndre B Lafond witnessed/unwitnessed fall occurred on *07/05/18* (Date) at 1400 (Time).     The answers to the following questions summarize the fall:     In the patient's own words,:  ?? What were you attempting to do when you fell?  eating  ?? Do you know why you fell? Because his legs are weak  ?? Do you have any pain/discomfort or any other complaints?  no  ?? Which part of your body made contact with the floor or other object?  buttock    Nurse:  ? Was this an assisted fall? yes  ? Was fall witnessed? Yes     By Whom?molly brown  ? If witnessed, what part of the body made contact with the floor or other object?  buttock  ? Patient???s mental status after the fall/when found: Alert and oriented  ? Any apparent injury:  No apparent injury  ? Immediate interventions for injury/suspected injury? No interventions needed  ? Patient assisted back to bed? Assist X1  ? Name of provider notified and time, any comments? MD Laveda Normanran       ? Name of family member notified and time: NA      Immediate VS and physical assessment documented in flow sheets.    Neuro assessment every hour x 4 (for potential head injury or unwitnessed fall) documented in flow sheets.      Kizzie FantasiaMolly Brown

## 2018-07-06 ENCOUNTER — Inpatient Hospital Stay: Admit: 2018-07-06 | Discharge: 2018-07-06 | Payer: PRIVATE HEALTH INSURANCE | Attending: Family Medicine

## 2018-07-06 ENCOUNTER — Inpatient Hospital Stay
Admit: 2018-07-06 | Discharge: 2018-07-06 | Disposition: A | Discharge status: Home / Self Care | Payer: PRIVATE HEALTH INSURANCE | Attending: Emergency Medicine

## 2018-07-06 DIAGNOSIS — Z760 Encounter for issue of repeat prescription: Secondary | ICD-10-CM

## 2018-07-06 MED ORDER — DIVALPROEX 500 MG TAB, DELAYED RELEASE
500 mg | ORAL_TABLET | Freq: Two times a day (BID) | ORAL | 0 refills | Status: DC
Start: 2018-07-06 — End: 2018-07-07

## 2018-07-06 MED ORDER — TRAZODONE 50 MG TAB
50 mg | ORAL_TABLET | Freq: Every evening | ORAL | 0 refills | Status: AC
Start: 2018-07-06 — End: ?

## 2018-07-06 NOTE — ED Notes (Signed)
Discharge instructions and 2 prescriptions reviewed and acknowledged by the patient. Patient stable. No signs of distress noted.

## 2018-07-06 NOTE — ED Notes (Signed)
Pt came to ED for refill of meds, lost scripts and needs something until he sees his PCP.

## 2018-07-06 NOTE — ED Notes (Signed)
Pt refusing to come onto unit pt being verbally abusive to staff  stating he needs to find his charger before he comes to the room Dr. Lucius Conn made aware, MD attempted to talk to patient still refusing to speak to physician stating he need to go smoke

## 2018-07-06 NOTE — ED Provider Notes (Signed)
ED Provider Notes by Bridget Hartshorn, PA at 07/06/18 1244                Author: Bridget Hartshorn, PA  Service: Emergency Medicine  Author Type: Physician Assistant       Filed: 07/06/18 2158  Date of Service: 07/06/18 1244  Status: Attested           Editor: Bridget Hartshorn, PA (Physician Assistant)  Cosigner: Ned Card, DO at 07/07/18 4782          Attestation signed by Ned Card, DO at 07/07/18 0701          I was available in the Emergency Department for consultation if needed.                                  Darryl Atkinson is a 57 y.o. male w/ PMHx of bipolar disorder and frequent visits to the ED who presents to the  ED with complaint for a medication refill. States that he lost his prescription for his depakote and would like a new one. Denies any systemic symptoms or medical complaints at this moment in time.          A/p   Med refill   Depakote   trazodone    F/u with pmd in 2 days       The history is provided by the patient. No language interpreter  was used.    Medication Problem     This  is a new problem. The current episode started yesterday. The problem has not changed since onset.Pertinent negatives include  no nausea, no vomiting and no shortness of breath.             Past Medical History:        Diagnosis  Date         ?  Bipolar 1 disorder (HCC)       ?  Hypertension           ?  Psychiatric disorder             No past surgical history on file.        No family history on file.        Social History          Socioeconomic History         ?  Marital status:  SINGLE              Spouse name:  Not on file         ?  Number of children:  Not on file     ?  Years of education:  Not on file     ?  Highest education level:  Not on file       Occupational History        ?  Not on file       Social Needs         ?  Financial resource strain:  Not on file        ?  Food insecurity:              Worry:  Not on file         Inability:  Not on file        ?  Transportation needs:               Medical:  Not on file         Non-medical:  Not on file       Tobacco Use         ?  Smoking status:  Current Every Day Smoker     ?  Smokeless tobacco:  Never Used       Substance and Sexual Activity         ?  Alcohol use:  Yes     ?  Drug use:  Yes              Types:  Heroin             Comment: last use days ago 06/2018         ?  Sexual activity:  Not on file       Lifestyle        ?  Physical activity:              Days per week:  Not on file         Minutes per session:  Not on file         ?  Stress:  Not on file       Relationships        ?  Social connections:              Talks on phone:  Not on file         Gets together:  Not on file         Attends religious service:  Not on file         Active member of club or organization:  Not on file         Attends meetings of clubs or organizations:  Not on file         Relationship status:  Not on file        ?  Intimate partner violence:              Fear of current or ex partner:  Not on file         Emotionally abused:  Not on file         Physically abused:  Not on file         Forced sexual activity:  Not on file        Other Topics  Concern        ?  Not on file       Social History Narrative        ?  Not on file              ALLERGIES: Haldol [haloperidol lactate]      Review of Systems    Constitutional: Negative for chills and fever.    HENT: Negative for congestion.     Respiratory: Negative for chest tightness and shortness of breath.     Cardiovascular: Negative for chest pain and palpitations.    Gastrointestinal: Negative for abdominal pain, diarrhea, nausea and vomiting.    Genitourinary: Negative for difficulty urinating, flank pain and urgency.    Musculoskeletal: Negative for back pain, joint swelling and myalgias.    Neurological: Negative for speech difficulty, light-headedness and headaches.          There were no vitals filed for this visit.           Physical Exam       MDM   Number of Diagnoses or Management Options  Medication  refill:    Diagnosis management comments: 57 y.o. male presents to ED with no medical complaints for a medication refill. Lost his prescription for depakote and wants a new one.         Plan:    Med refill   F/u with [facility]       -----   12:48 PM   Documented by Sircharles LefortPhillip Long, acting as a scribe for Benson SettingSyma Rizvi, PA         PROVIDER ATTESTATION:   9:57 PM      The entirety of this note, signed by me, accurately reflects all works, treatments, procedures, and medical decision making performed by me, Bridget HartshornSyma H Rizvi, PA.                  Patient Progress   Patient progress: stable             Procedures      Diagnostic Studies:         Lab Data:   Labs Reviewed - No data to display         ED Course:

## 2018-07-06 NOTE — ED Provider Notes (Signed)
Darryl Atkinson is a 57 y.o. male w/ PMHx of bipolar disorder and frequent visits to the ED who presents to the ED with complaint for a medication refill. States that he lost his prescription for his depakote and would like a new one. Denies any systemic symptoms or medical complaints at this moment in time.       A/p  Med refill  Depakote  trazodone   F/u with pmd in 2 days     The history is provided by the patient. No language interpreter was used.   Medication Problem    This is a new problem. The current episode started yesterday. The problem has not changed since onset.Pertinent negatives include no nausea, no vomiting and no shortness of breath.        Past Medical History:   Diagnosis Date   ??? Bipolar 1 disorder (HCC)    ??? Hypertension    ??? Psychiatric disorder        No past surgical history on file.      No family history on file.    Social History     Socioeconomic History   ??? Marital status: SINGLE     Spouse name: Not on file   ??? Number of children: Not on file   ??? Years of education: Not on file   ??? Highest education level: Not on file   Occupational History   ??? Not on file   Social Needs   ??? Financial resource strain: Not on file   ??? Food insecurity:     Worry: Not on file     Inability: Not on file   ??? Transportation needs:     Medical: Not on file     Non-medical: Not on file   Tobacco Use   ??? Smoking status: Current Every Day Smoker   ??? Smokeless tobacco: Never Used   Substance and Sexual Activity   ??? Alcohol use: Yes   ??? Drug use: Yes     Types: Heroin     Comment: last use days ago 06/2018   ??? Sexual activity: Not on file   Lifestyle   ??? Physical activity:     Days per week: Not on file     Minutes per session: Not on file   ??? Stress: Not on file   Relationships   ??? Social connections:     Talks on phone: Not on file     Gets together: Not on file     Attends religious service: Not on file     Active member of club or organization: Not on file      Attends meetings of clubs or organizations: Not on file     Relationship status: Not on file   ??? Intimate partner violence:     Fear of current or ex partner: Not on file     Emotionally abused: Not on file     Physically abused: Not on file     Forced sexual activity: Not on file   Other Topics Concern   ??? Not on file   Social History Narrative   ??? Not on file         ALLERGIES: Haldol [haloperidol lactate]    Review of Systems   Constitutional: Negative for chills and fever.   HENT: Negative for congestion.    Respiratory: Negative for chest tightness and shortness of breath.    Cardiovascular: Negative for chest pain and palpitations.   Gastrointestinal: Negative  for abdominal pain, diarrhea, nausea and vomiting.   Genitourinary: Negative for difficulty urinating, flank pain and urgency.   Musculoskeletal: Negative for back pain, joint swelling and myalgias.   Neurological: Negative for speech difficulty, light-headedness and headaches.       There were no vitals filed for this visit.         Physical Exam     MDM  Number of Diagnoses or Management Options  Medication refill:   Diagnosis management comments: 57 y.o. male presents to ED with no medical complaints for a medication refill. Lost his prescription for depakote and wants a new one.      Plan:   Med refill  F/u with [facility]     -----  12:48 PM  Documented by Elby LefortPhillip Long, acting as a scribe for Benson SettingSyma Ahmani Daoud, PA      PROVIDER ATTESTATION:  9:57 PM    The entirety of this note, signed by me, accurately reflects all works, treatments, procedures, and medical decision making performed by me, Bridget HartshornSyma H Roma Bierlein, PA.            Patient Progress  Patient progress: stable         Procedures    Diagnostic Studies:      Lab Data:  Labs Reviewed - No data to display      ED Course:

## 2018-07-06 NOTE — ED Notes (Signed)
Pt refusing to come onto unit pt being verbally abusive to staff  stating he needs to find his charger before he comes to the room Dr. Javan made aware, MD attempted to talk to patient still refusing to speak to physician stating he need to go smoke

## 2018-07-06 NOTE — ED Triage Notes (Signed)
Pt came to ED for refill of meds, lost scripts and needs something until he sees his PCP.

## 2018-07-07 ENCOUNTER — Inpatient Hospital Stay
Admit: 2018-07-07 | Discharge: 2018-07-07 | Disposition: A | Discharge status: Home / Self Care | Payer: PRIVATE HEALTH INSURANCE | Attending: Student in an Organized Health Care Education/Training Program

## 2018-07-07 DIAGNOSIS — M545 Low back pain: Secondary | ICD-10-CM

## 2018-07-07 MED ORDER — ACETAMINOPHEN 500 MG TAB
500 mg | ORAL_TABLET | Freq: Four times a day (QID) | ORAL | 0 refills | Status: AC | PRN
Start: 2018-07-07 — End: ?

## 2018-07-07 MED ORDER — IBUPROFEN 600 MG TAB
600 mg | Freq: Once | ORAL | Status: AC
Start: 2018-07-07 — End: 2018-07-07
  Administered 2018-07-07: 10:00:00 via ORAL

## 2018-07-07 MED ORDER — DIVALPROEX 500 MG TAB, DELAYED RELEASE
500 mg | ORAL_TABLET | Freq: Two times a day (BID) | ORAL | 0 refills | Status: AC
Start: 2018-07-07 — End: 2018-07-22

## 2018-07-07 MED ORDER — IBUPROFEN 600 MG TAB
600 mg | ORAL_TABLET | Freq: Four times a day (QID) | ORAL | 0 refills | Status: AC | PRN
Start: 2018-07-07 — End: ?

## 2018-07-07 MED ORDER — LIDOCAINE 4 % TOPICAL PATCH (12 HOUR DURATION)
4 % | Freq: Once | CUTANEOUS | Status: DC
Start: 2018-07-07 — End: 2018-07-07

## 2018-07-07 MED FILL — IBUPROFEN 600 MG TAB: 600 mg | ORAL | Qty: 1

## 2018-07-07 MED FILL — ASPERCREME (LIDOCAINE) 4 % TOPICAL PATCH: 4 % | CUTANEOUS | Qty: 1
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
## 2018-07-07 NOTE — ED Notes (Signed)
I have reviewed discharge instructions with the patient.  The patient verbalized understanding.    Patient A/O x4, NAD.
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
## 2018-07-08 ENCOUNTER — Inpatient Hospital Stay
Admit: 2018-07-08 | Discharge: 2018-07-08 | Disposition: A | Discharge status: Home / Self Care | Payer: PRIVATE HEALTH INSURANCE | Attending: Emergency Medicine

## 2018-07-08 DIAGNOSIS — M549 Dorsalgia, unspecified: Secondary | ICD-10-CM

## 2018-07-08 MED ORDER — LIDOCAINE 4 % TOPICAL PATCH (12 HOUR DURATION)
4 % | Freq: Once | CUTANEOUS | Status: DC
Start: 2018-07-08 — End: 2018-07-08

## 2018-07-08 MED ORDER — NAPROXEN 500 MG TAB
500 mg | ORAL_TABLET | Freq: Two times a day (BID) | ORAL | 0 refills | Status: AC
Start: 2018-07-08 — End: 2018-07-18

## 2018-07-08 MED ORDER — NAPROXEN 250 MG TAB
250 mg | ORAL | Status: AC
Start: 2018-07-08 — End: 2018-07-08
  Administered 2018-07-08: 10:00:00 via ORAL

## 2018-07-08 MED FILL — ASPERCREME (LIDOCAINE) 4 % TOPICAL PATCH: 4 % | CUTANEOUS | Qty: 1

## 2018-07-08 MED FILL — NAPROXEN 250 MG TAB: 250 mg | ORAL | Qty: 2

## 2018-07-08 NOTE — ED Notes (Signed)
I have reviewed discharge instructions with the patient.  The patient verbalized understanding.
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
## 2018-08-28 ENCOUNTER — Emergency Department: Payer: PRIVATE HEALTH INSURANCE | Primary: Family Medicine

## 2018-08-28 DIAGNOSIS — M25569 Pain in unspecified knee: Secondary | ICD-10-CM
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
## 2018-08-28 NOTE — ED Provider Notes (Signed)
HPI     Past Medical History:   Diagnosis Date   ??? Bipolar 1 disorder (HCC)    ??? Hypertension    ??? Psychiatric disorder        History reviewed. No pertinent surgical history.      History reviewed. No pertinent family history.    Social History     Socioeconomic History   ??? Marital status: SINGLE     Spouse name: Not on file   ??? Number of children: Not on file   ??? Years of education: Not on file   ??? Highest education level: Not on file   Occupational History   ??? Not on file   Social Needs   ??? Financial resource strain: Not on file   ??? Food insecurity:     Worry: Not on file     Inability: Not on file   ??? Transportation needs:     Medical: Not on file     Non-medical: Not on file   Tobacco Use   ??? Smoking status: Current Every Day Smoker     Packs/day: 1.50   ??? Smokeless tobacco: Current User   Substance and Sexual Activity   ??? Alcohol use: Yes   ??? Drug use: Yes     Types: Heroin     Comment: last use days ago 06/2018   ??? Sexual activity: Not Currently   Lifestyle   ??? Physical activity:     Days per week: Not on file     Minutes per session: Not on file   ??? Stress: Not on file   Relationships   ??? Social connections:     Talks on phone: Not on file     Gets together: Not on file     Attends religious service: Not on file     Active member of club or organization: Not on file     Attends meetings of clubs or organizations: Not on file     Relationship status: Not on file   ??? Intimate partner violence:     Fear of current or ex partner: Not on file     Emotionally abused: Not on file     Physically abused: Not on file     Forced sexual activity: Not on file   Other Topics Concern   ??? Not on file   Social History Narrative   ??? Not on file         ALLERGIES: Haldol [haloperidol lactate]    Review of Systems    Vitals:    08/28/18 2023   BP: 122/71   Pulse: 100   Resp: 18   Temp: 98.3 ??F (36.8 ??C)   SpO2: 96%   Weight: 94.8 kg (209 lb)   Height: 5\' 9"  (1.753 Myleen Brailsford)            Physical Exam     MDM        Procedures          LEFT WITHOUT BEING SEEN

## 2018-08-28 NOTE — ED Triage Notes (Signed)
Patient brought in by EMS related to [employer] police called reported patient came out of an elevator  Stated he fell. Pt witness by EMS walking on scene. Pt has a laceration to left knee. Pt also c/o  chronic back and leg pain

## 2018-08-28 NOTE — ED Provider Notes (Signed)
HPI     Past Medical History:   Diagnosis Date   ??? Bipolar 1 disorder (HCC)    ??? Hypertension    ??? Psychiatric disorder        History reviewed. No pertinent surgical history.      History reviewed. No pertinent family history.    Social History     Socioeconomic History   ??? Marital status: SINGLE     Spouse name: Not on file   ??? Number of children: Not on file   ??? Years of education: Not on file   ??? Highest education level: Not on file   Occupational History   ??? Not on file   Social Needs   ??? Financial resource strain: Not on file   ??? Food insecurity:     Worry: Not on file     Inability: Not on file   ??? Transportation needs:     Medical: Not on file     Non-medical: Not on file   Tobacco Use   ??? Smoking status: Current Every Day Smoker     Packs/day: 1.50   ??? Smokeless tobacco: Current User   Substance and Sexual Activity   ??? Alcohol use: Yes   ??? Drug use: Yes     Types: Heroin     Comment: last use days ago 06/2018   ??? Sexual activity: Not Currently   Lifestyle   ??? Physical activity:     Days per week: Not on file     Minutes per session: Not on file   ??? Stress: Not on file   Relationships   ??? Social connections:     Talks on phone: Not on file     Gets together: Not on file     Attends religious service: Not on file     Active member of club or organization: Not on file     Attends meetings of clubs or organizations: Not on file     Relationship status: Not on file   ??? Intimate partner violence:     Fear of current or ex partner: Not on file     Emotionally abused: Not on file     Physically abused: Not on file     Forced sexual activity: Not on file   Other Topics Concern   ??? Not on file   Social History Narrative   ??? Not on file         ALLERGIES: Haldol [haloperidol lactate]    Review of Systems    Vitals:    08/28/18 2023   BP: 122/71   Pulse: 100   Resp: 18   Temp: 98.3 ??F (36.8 ??C)   SpO2: 96%   Weight: 94.8 kg (209 lb)   Height: 5\' 9"  (1.753 Boysie Bonebrake)            Physical Exam     MDM       Procedures           LEFT WITHOUT BEING SEEN

## 2018-08-29 ENCOUNTER — Inpatient Hospital Stay: Admit: 2018-08-29 | Discharge: 2018-08-29 | Payer: PRIVATE HEALTH INSURANCE | Attending: Specialist

## 2018-12-17 ENCOUNTER — Inpatient Hospital Stay
Admit: 2018-12-17 | Discharge: 2018-12-18 | Disposition: A | Discharge status: Home / Self Care | Payer: PRIVATE HEALTH INSURANCE | Attending: Emergency Medicine

## 2018-12-17 DIAGNOSIS — S39012A Strain of muscle, fascia and tendon of lower back, initial encounter: Secondary | ICD-10-CM

## 2018-12-17 NOTE — ED Notes (Signed)
Patient provided with meal.    I have reviewed discharge instructions with the patient.  The patient verbalized understanding.

## 2018-12-17 NOTE — ED Provider Notes (Signed)
58 YO M c/o back pain "next to my spinal cord injury."  He describes chronic low back pain, worse today after raking.  Did not take anything for pain.  Able to walk.  Usually gets "600 mg" of something for pain.  Denies fever, chills, abdominal pain, hematuria, dysuria, new focal neuro deficits, or other complaints.           Past Medical History:   Diagnosis Date   ??? Bipolar 1 disorder (HCC)    ??? Diabetes (HCC)    ??? Hypertension    ??? Psychiatric disorder        History reviewed. No pertinent surgical history.      History reviewed. No pertinent family history.    Social History     Socioeconomic History   ??? Marital status: SINGLE     Spouse name: Not on file   ??? Number of children: Not on file   ??? Years of education: Not on file   ??? Highest education level: Not on file   Occupational History   ??? Not on file   Social Needs   ??? Financial resource strain: Not on file   ??? Food insecurity     Worry: Not on file     Inability: Not on file   ??? Transportation needs     Medical: Not on file     Non-medical: Not on file   Tobacco Use   ??? Smoking status: Current Every Day Smoker     Packs/day: 1.50   ??? Smokeless tobacco: Current User   Substance and Sexual Activity   ??? Alcohol use: Yes     Alcohol/week: 28.0 standard drinks     Types: 28 Cans of beer per week   ??? Drug use: Yes     Types: Heroin     Comment: last use days ago 06/2018   ??? Sexual activity: Not Currently   Lifestyle   ??? Physical activity     Days per week: Not on file     Minutes per session: Not on file   ??? Stress: Not on file   Relationships   ??? Social Wellsite geologist on phone: Not on file     Gets together: Not on file     Attends religious service: Not on file     Active member of club or organization: Not on file     Attends meetings of clubs or organizations: Not on file     Relationship status: Not on file   ??? Intimate partner violence     Fear of current or ex partner: Not on file     Emotionally abused: Not on file     Physically abused: Not on  file     Forced sexual activity: Not on file   Other Topics Concern   ??? Not on file   Social History Narrative   ??? Not on file         ALLERGIES: Haldol [haloperidol lactate]    Review of Systems   Constitutional: Negative for chills and fever.   Cardiovascular: Negative for chest pain.   Gastrointestinal: Negative for abdominal pain and vomiting.        No fecal incontinence   Genitourinary: Negative for dysuria and hematuria.        No incontinence or retention   Musculoskeletal: Positive for back pain. Negative for neck pain.   Neurological: Negative for weakness and numbness.   All other systems reviewed  and are negative.      Vitals:    12/17/18 1951   BP: (!) 180/110   Pulse: (!) 101   Resp: 18   Temp: 98.3 ??F (36.8 ??C)   SpO2: 100%   Weight: 94.8 kg (209 lb)   Height: 5\' 9"  (1.753 m)            Physical Exam  Vitals signs and nursing note reviewed.   Constitutional:       Appearance: Normal appearance.   HENT:      Head: Normocephalic and atraumatic.   Eyes:      Extraocular Movements: Extraocular movements intact.      Pupils: Pupils are equal, round, and reactive to light.   Neck:      Musculoskeletal: Normal range of motion and neck supple. No muscular tenderness.   Cardiovascular:      Rate and Rhythm: Normal rate.      Comments: Normal perfusion  Pulmonary:      Effort: Pulmonary effort is normal.   Chest:      Chest wall: No tenderness.   Abdominal:      General: Abdomen is flat.      Palpations: Abdomen is soft.      Tenderness: There is no abdominal tenderness.   Musculoskeletal: Normal range of motion.         General: Tenderness present. No deformity.      Comments: L lumbar paraspinal muscle tenderness with spasm   Skin:     General: Skin is warm and dry.   Neurological:      Mental Status: He is alert and oriented to person, place, and time.      Comments: Moving all extremities, normal speech   Psychiatric:         Mood and Affect: Mood normal.         Behavior: Behavior normal.           MDM  Number of Diagnoses or Management Options  Acute myofascial strain of lumbar region, initial encounter:   Diagnosis management comments: 58 year old male c/o acute on chronic low back pain.  No focal neuro deficits or other complaints.  Exam notable for left lumbar paraspinal muscle tenderness with spasm.  Consistent with lumbar muscle strain; not concerning for fracture, cauda equina syndrome, or aortic dissection.  Conservative management indicated.  Stable for discharge home.         Procedures
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
## 2018-12-17 NOTE — ED Notes (Signed)
Patient asked for food. Patient offered water and told doctor needs to see him before he can eat.

## 2018-12-17 NOTE — ED Notes (Signed)
Patient ambulatory with cane to restroom, steady gait, NAD.

## 2018-12-17 NOTE — ED Provider Notes (Signed)
58 YO M c/o back pain "next to my spinal cord injury."  He describes chronic low back pain, worse today after raking.  Did not take anything for pain.  Able to walk.  Usually gets "600 mg" of something for pain.  Denies fever, chills, abdominal pain, hematuria, dysuria, new focal neuro deficits, or other complaints.           Past Medical History:   Diagnosis Date   ??? Bipolar 1 disorder (HCC)    ??? Diabetes (HCC)    ??? Hypertension    ??? Psychiatric disorder        History reviewed. No pertinent surgical history.      History reviewed. No pertinent family history.    Social History     Socioeconomic History   ??? Marital status: SINGLE     Spouse name: Not on file   ??? Number of children: Not on file   ??? Years of education: Not on file   ??? Highest education level: Not on file   Occupational History   ??? Not on file   Social Needs   ??? Financial resource strain: Not on file   ??? Food insecurity     Worry: Not on file     Inability: Not on file   ??? Transportation needs     Medical: Not on file     Non-medical: Not on file   Tobacco Use   ??? Smoking status: Current Every Day Smoker     Packs/day: 1.50   ??? Smokeless tobacco: Current User   Substance and Sexual Activity   ??? Alcohol use: Yes     Alcohol/week: 28.0 standard drinks     Types: 28 Cans of beer per week   ??? Drug use: Yes     Types: Heroin     Comment: last use days ago 06/2018   ??? Sexual activity: Not Currently   Lifestyle   ??? Physical activity     Days per week: Not on file     Minutes per session: Not on file   ??? Stress: Not on file   Relationships   ??? Social Wellsite geologist on phone: Not on file     Gets together: Not on file     Attends religious service: Not on file     Active member of club or organization: Not on file     Attends meetings of clubs or organizations: Not on file     Relationship status: Not on file   ??? Intimate partner violence     Fear of current or ex partner: Not on file     Emotionally abused: Not on file      Physically abused: Not on file     Forced sexual activity: Not on file   Other Topics Concern   ??? Not on file   Social History Narrative   ??? Not on file         ALLERGIES: Haldol [haloperidol lactate]    Review of Systems   Constitutional: Negative for chills and fever.   Cardiovascular: Negative for chest pain.   Gastrointestinal: Negative for abdominal pain and vomiting.        No fecal incontinence   Genitourinary: Negative for dysuria and hematuria.        No incontinence or retention   Musculoskeletal: Positive for back pain. Negative for neck pain.   Neurological: Negative for weakness and numbness.   All other systems reviewed  and are negative.      Vitals:    12/17/18 1951   BP: (!) 180/110   Pulse: (!) 101   Resp: 18   Temp: 98.3 ??F (36.8 ??C)   SpO2: 100%   Weight: 94.8 kg (209 lb)   Height: 5\' 9"  (1.753 m)            Physical Exam  Vitals signs and nursing note reviewed.   Constitutional:       Appearance: Normal appearance.   HENT:      Head: Normocephalic and atraumatic.   Eyes:      Extraocular Movements: Extraocular movements intact.      Pupils: Pupils are equal, round, and reactive to light.   Neck:      Musculoskeletal: Normal range of motion and neck supple. No muscular tenderness.   Cardiovascular:      Rate and Rhythm: Normal rate.      Comments: Normal perfusion  Pulmonary:      Effort: Pulmonary effort is normal.   Chest:      Chest wall: No tenderness.   Abdominal:      General: Abdomen is flat.      Palpations: Abdomen is soft.      Tenderness: There is no abdominal tenderness.   Musculoskeletal: Normal range of motion.         General: Tenderness present. No deformity.      Comments: L lumbar paraspinal muscle tenderness with spasm   Skin:     General: Skin is warm and dry.   Neurological:      Mental Status: He is alert and oriented to person, place, and time.      Comments: Moving all extremities, normal speech   Psychiatric:         Mood and Affect: Mood normal.          Behavior: Behavior normal.          MDM  Number of Diagnoses or Management Options  Acute myofascial strain of lumbar region, initial encounter:   Diagnosis management comments: 58 year old male c/o acute on chronic low back pain.  No focal neuro deficits or other complaints.  Exam notable for left lumbar paraspinal muscle tenderness with spasm.  Consistent with lumbar muscle strain; not concerning for fracture, cauda equina syndrome, or aortic dissection.  Conservative management indicated.  Stable for discharge home.         Procedures

## 2018-12-17 NOTE — ED Notes (Signed)
Patient ambulatory with cane to restroom, steady gait, NAD.

## 2018-12-17 NOTE — ED Notes (Signed)
Patient provided with meal.    I have reviewed discharge instructions with the patient.  The patient verbalized understanding.
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
## 2018-12-17 NOTE — ED Notes (Signed)
Patient asked for food. Patient offered water and told doctor needs to see him before he can eat.

## 2018-12-18 LAB — GLUCOSE, POC: Glucose (POC): 220 mg/dL — ABNORMAL HIGH (ref 74–106)

## 2018-12-18 LAB — POCT GLUCOSE: POC Glucose: 220 mg/dL — ABNORMAL HIGH (ref 74–106)

## 2018-12-18 MED ORDER — CYCLOBENZAPRINE 10 MG TAB
10 mg | ORAL | Status: AC
Start: 2018-12-18 — End: 2018-12-17
  Administered 2018-12-18: 02:00:00 via ORAL

## 2018-12-18 MED ORDER — IBUPROFEN 600 MG TAB
600 mg | ORAL | Status: AC
Start: 2018-12-18 — End: 2018-12-17
  Administered 2018-12-18: 02:00:00 via ORAL

## 2018-12-18 MED ORDER — CYCLOBENZAPRINE 5 MG TAB
5 mg | ORAL_TABLET | Freq: Three times a day (TID) | ORAL | 0 refills | Status: AC | PRN
Start: 2018-12-18 — End: ?

## 2018-12-18 MED FILL — CYCLOBENZAPRINE 10 MG TAB: 10 mg | ORAL | Qty: 1

## 2018-12-18 MED FILL — IBUPROFEN 600 MG TAB: 600 mg | ORAL | Qty: 1

## 2018-12-28 DIAGNOSIS — M79662 Pain in left lower leg: Secondary | ICD-10-CM

## 2018-12-28 NOTE — ED Notes (Signed)
EMS reports pt complains of leg pain affecting both legs. Pt states leg pain from running too much. EMS also reports pt arm pain. Pt seen at [facility] earlier today.

## 2018-12-28 NOTE — ED Triage Notes (Signed)
EMS reports pt complains of leg pain affecting both legs. Pt states leg pain from running too much. EMS also reports pt arm pain. Pt seen at [facility] earlier today.

## 2018-12-29 ENCOUNTER — Inpatient Hospital Stay
Admit: 2018-12-29 | Discharge: 2018-12-29 | Disposition: A | Discharge status: Home / Self Care | Payer: PRIVATE HEALTH INSURANCE | Attending: Emergency Medicine

## 2018-12-29 MED ORDER — ACETAMINOPHEN 325 MG TABLET
325 mg | ORAL | Status: AC
Start: 2018-12-29 — End: 2018-12-29
  Administered 2018-12-29: 04:00:00 via ORAL

## 2018-12-29 MED ORDER — IBUPROFEN 600 MG TAB
600 mg | ORAL | Status: AC
Start: 2018-12-29 — End: 2018-12-29
  Administered 2018-12-29: 04:00:00 via ORAL

## 2018-12-29 MED FILL — IBUPROFEN 600 MG TAB: 600 mg | ORAL | Qty: 1

## 2018-12-29 MED FILL — TYLENOL 325 MG TABLET: 325 mg | ORAL | Qty: 2

## 2018-12-29 NOTE — ED Notes (Signed)
[facility] could not engage the patient in the ED because of COVID-19.
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
## 2018-12-29 NOTE — ED Notes (Signed)
I have reviewed discharge instructions with the patient.  The patient verbalized understanding. After discharge patient cursing at staff throwing  Water cup on floor
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
## 2018-12-29 NOTE — ED Provider Notes (Signed)
58 year old male who is homeless, walks the streets daily, uses a cane for extra support.  Has a history of chronic back pain, chronic leg pain, and states when he was in [facility], he used a Occupational hygienistollator which helped.  They did not send him home with a Rollator.  This patient was seen earlier today at [facility] for the same complaint of chronic leg pain.    On my initial approach to bed he would not wake up to my verbal stimuli.  Ultimately I told him I would be discharging him and he woke up and talked a bit, saying that he wanted to make a phone call.  I explained that with chronic pain I am not able to give him narcotics I be happy to give him Motrin or some Tylenol.    He has a history of bipolar disorder, diabetes hypertension,  obesity, chronic leg pain.           Past Medical History:   Diagnosis Date   ??? Bipolar 1 disorder (HCC)    ??? Diabetes (HCC)    ??? Hypertension    ??? Psychiatric disorder        No past surgical history on file.      No family history on file.    Social History     Socioeconomic History   ??? Marital status: SINGLE     Spouse name: Not on file   ??? Number of children: Not on file   ??? Years of education: Not on file   ??? Highest education level: Not on file   Occupational History   ??? Not on file   Social Needs   ??? Financial resource strain: Not on file   ??? Food insecurity     Worry: Not on file     Inability: Not on file   ??? Transportation needs     Medical: Not on file     Non-medical: Not on file   Tobacco Use   ??? Smoking status: Current Every Day Smoker     Packs/day: 1.50   ??? Smokeless tobacco: Current User   Substance and Sexual Activity   ??? Alcohol use: Yes     Alcohol/week: 28.0 standard drinks     Types: 28 Cans of beer per week   ??? Drug use: Yes     Types: Heroin     Comment: last use days ago 06/2018   ??? Sexual activity: Not Currently   Lifestyle   ??? Physical activity     Days per week: Not on file     Minutes per session: Not on file   ??? Stress: Not on file    Relationships   ??? Social Wellsite geologistconnections     Talks on phone: Not on file     Gets together: Not on file     Attends religious service: Not on file     Active member of club or organization: Not on file     Attends meetings of clubs or organizations: Not on file     Relationship status: Not on file   ??? Intimate partner violence     Fear of current or ex partner: Not on file     Emotionally abused: Not on file     Physically abused: Not on file     Forced sexual activity: Not on file   Other Topics Concern   ??? Not on file   Social History Narrative   ??? Not  on file         ALLERGIES: Haldol [haloperidol lactate]    Review of Systems   Unable to perform ROS: Psychiatric disorder (To sleep, he is here with all of his bags.  He is wearing his old scrubs from a previous hospital visit.  Will not answer review of system questions)       Vitals:    12/28/18 2327   Pulse: 78   Resp: 18   Temp: 97.4 ??F (36.3 ??C)   SpO2: 99%   Weight: 94.8 kg (209 lb)   Height: 5\' 9"  (1.753 m)            Physical Exam  Vitals signs and nursing note reviewed.   Constitutional:       Appearance: He is well-developed. He is obese.      Comments: Somnolent,   HENT:      Head: Normocephalic and atraumatic.      Nose: Nose normal.      Mouth/Throat:      Mouth: Mucous membranes are moist.   Eyes:      General: No scleral icterus.        Right eye: No discharge.         Left eye: No discharge.      Conjunctiva/sclera: Conjunctivae normal.   Neck:      Musculoskeletal: Normal range of motion and neck supple.   Cardiovascular:      Rate and Rhythm: Normal rate and regular rhythm.      Heart sounds: Normal heart sounds. No murmur. No friction rub. No gallop.    Pulmonary:      Effort: Pulmonary effort is normal. No respiratory distress.      Breath sounds: Normal breath sounds. No wheezing or rales.   Abdominal:      General: Bowel sounds are normal. There is no distension.      Palpations: Abdomen is soft.       Tenderness: There is no abdominal tenderness. There is no guarding or rebound.   Musculoskeletal: Normal range of motion.         General: No tenderness, deformity or signs of injury.      Right lower leg: No edema.      Left lower leg: No edema.      Comments: Legs are not particularly edematous, he is diffusely tender to palpation.  There is no erythema or concern for cellulitis.   Skin:     General: Skin is warm and dry.      Findings: No erythema or rash.   Neurological:      General: No focal deficit present.      Mental Status: He is alert.   Psychiatric:         Behavior: Behavior normal.         Thought Content: Thought content normal.         Judgment: Judgment normal.          MDM  Number of Diagnoses or Management Options  Chronic pain of both lower extremities:   Diagnosis management comments: 58 year old male, here for chronic leg pain.  Was just seen [facility] at [facility] for the same complaint.  Will give Tylenol and Motrin and discharge to home.  He does have a cane for ambulation.         Procedures    Encounter Diagnoses     ICD-10-CM ICD-9-CM   1. Chronic pain of both lower extremities M79.604 729.5  M79.605 338.29    G89.29    Naaman Plummer, MD

## 2018-12-29 NOTE — ED Notes (Signed)
I have reviewed discharge instructions with the patient.  The patient verbalized understanding. After discharge patient cursing at staff throwing  Water cup on floor
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
## 2019-01-02 NOTE — ED Notes (Signed)
Telephone call follow-up. Unavailable

## 2019-01-02 NOTE — ED Notes (Signed)
Telephone call follow-up. Unavailable

## 2019-11-06 IMAGING — CT CT CERVICAL SPINE W/O CM
4 of 7 series · 14 of 33 positions shown, 15 images · non-contrast
Comparison: 11/14/2016 CT head.  12/25/2015 CT cervical spine.

CLINICAL DATA: 56 y/o  M; multiple falls and head trauma.

EXAM:
CT HEAD WITHOUT CONTRAST
CT CERVICAL SPINE WITHOUT CONTRAST
TECHNIQUE: Multidetector CT imaging of the head and cervical spine was
performed following the standard protocol without intravenous
contrast. Multiplanar CT image reconstructions of the cervical spine
were also generated.

[Series 9: c spine soft · axial · 0.31mm/px · z∈[+1184,+1286]mm · 4 of 85 slices shown]
[im 17/85  soft-tissue]
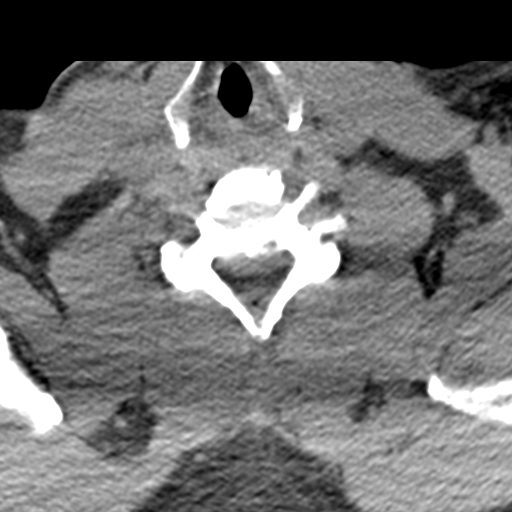
[im 34/85  soft-tissue]
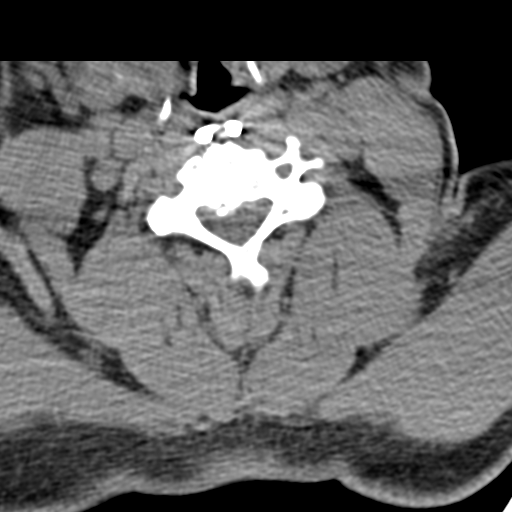
[im 51/85  soft-tissue]
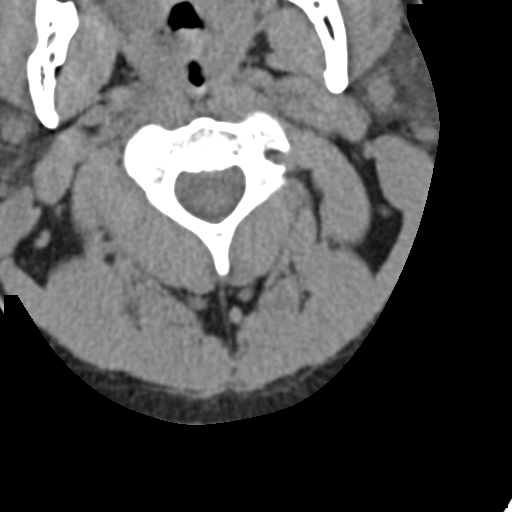
[im 68/85  soft-tissue]
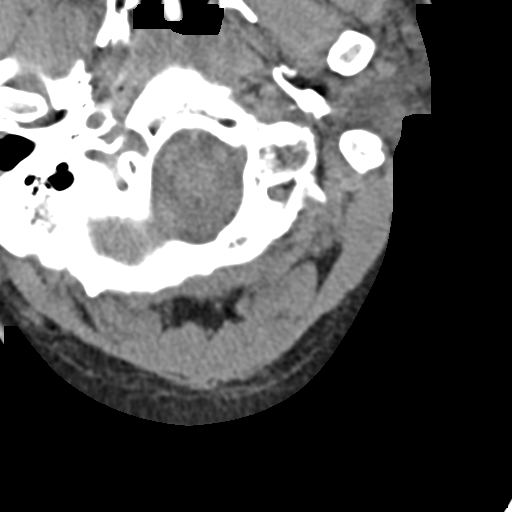

[Series 10: orthogonal bone · axial · 0.23mm/px · z∈[+1159,+1249]mm · 4 of 83 slices shown, 5 images]
[im 17/83  soft-tissue]
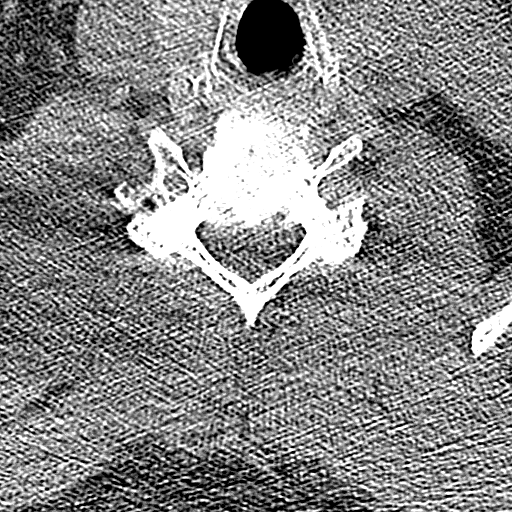
[im 17/83  bone]
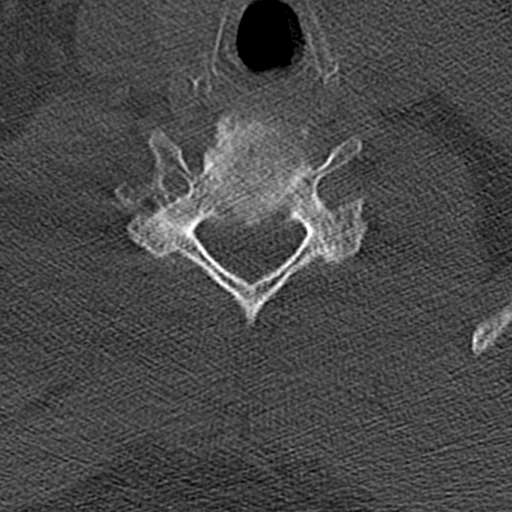
[im 33/83  bone]
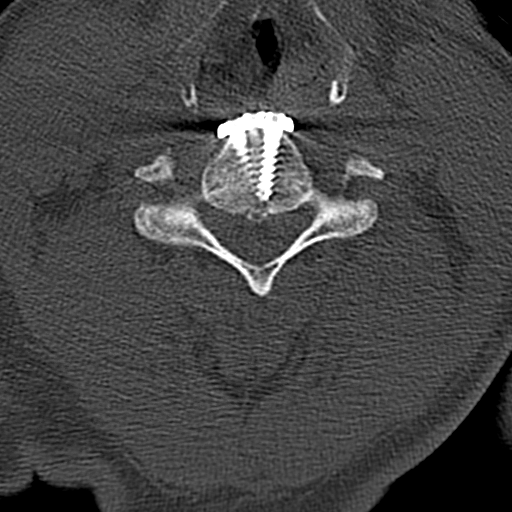
[im 50/83  bone]
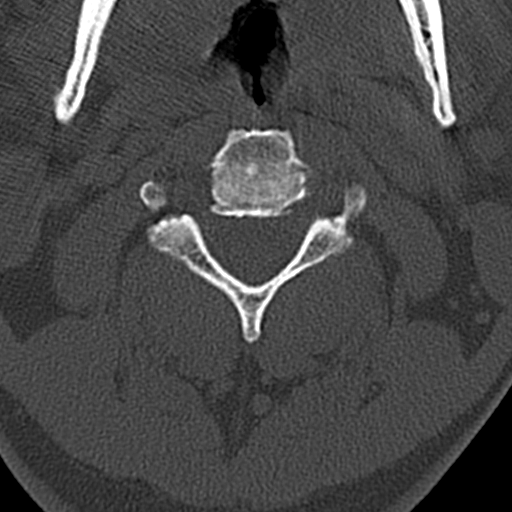
[im 66/83  bone]
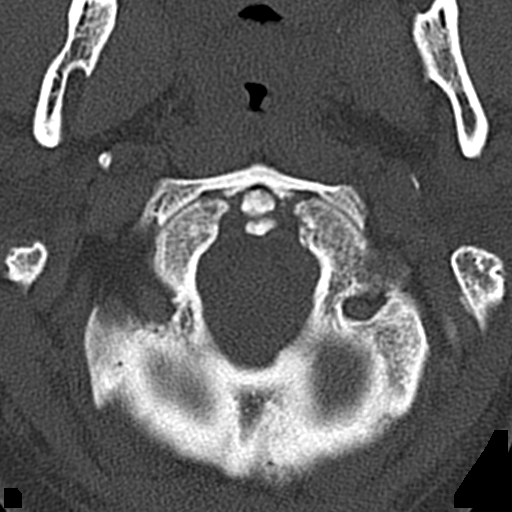

[Series 11: coronal bone · coronal · 0.25mm/px · 1 of 58 slices shown]
[im 29/58  bone]
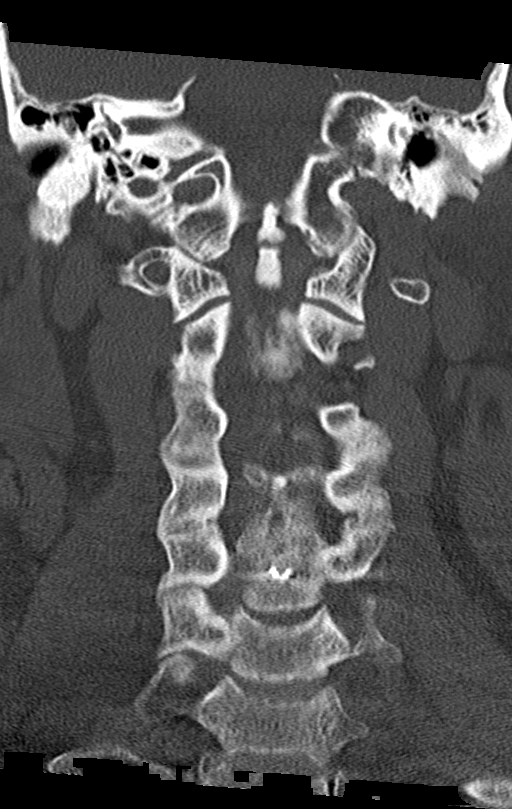

[Series 12: sagittal bone · sagittal · 0.25mm/px · 5 of 50 slices shown]
[im 9/50  bone]
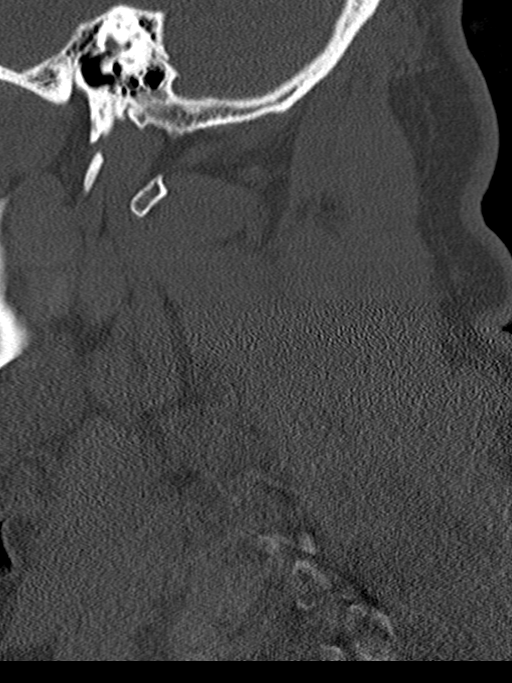
[im 17/50  bone]
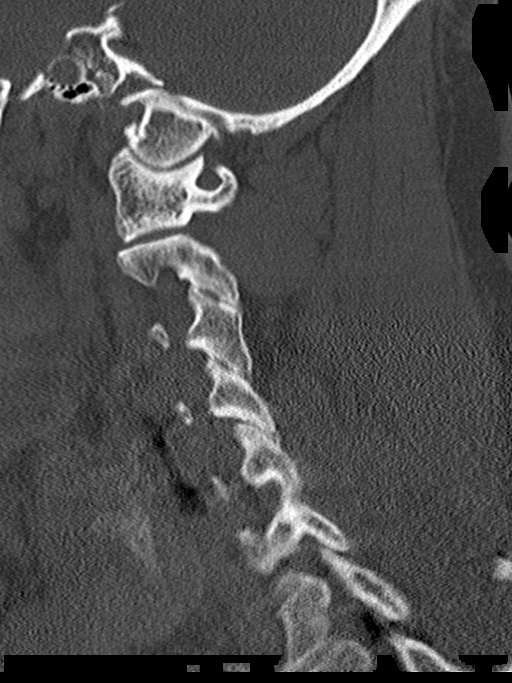
[im 25/50  bone]
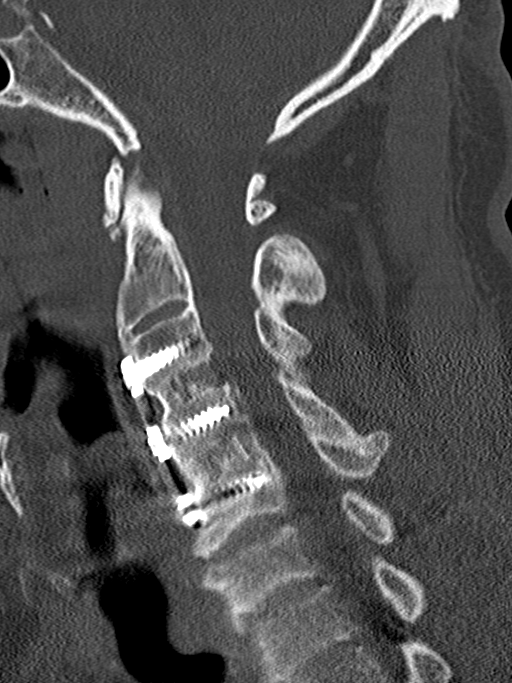
[im 33/50  bone]
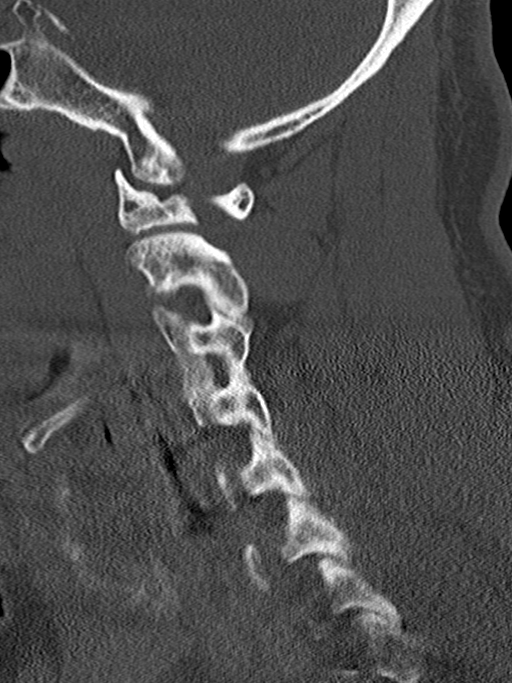
[im 41/50  bone]
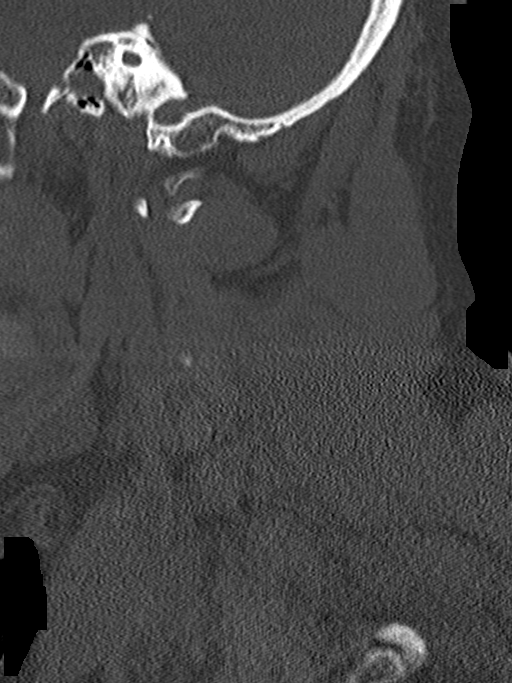

[14 of 33 positions shown; findings below may reference images not displayed]

FINDINGS: CT HEAD FINDINGS

Brain: No evidence of acute infarction, hemorrhage, hydrocephalus,
extra-axial collection or mass lesion/mass effect. Nonspecific foci
of hypoattenuation in subcortical and periventricular white matter
are compatible with mild chronic microvascular ischemic changes and
there is mild brain parenchymal volume loss.

Vascular: No hyperdense vessel or unexpected calcification.

Skull: Normal. Negative for fracture or focal lesion.

Sinuses/Orbits: Ethmoid sinus disease and small right maxillary
sinus fluid level. Otherwise negative.

Other: None.

CT CERVICAL SPINE FINDINGS

Alignment: Straightening of cervical lordosis.  No listhesis.

Skull base and vertebrae: No acute fracture identified. C3-C5
anterior and interbody fusion. Fusion hardware is intact and there
is no periprosthetic lucency or fracture identified. Additionally,
there is fusion of C2-C5 anterior interspinous ligament.

Soft tissues and spinal canal: No prevertebral fluid or swelling. No
visible canal hematoma.

Disc levels: Ossific ridging results in mild to moderate bony canal
stenosis at C4 and C5.

Upper chest: Negative.

Other: Negative.
IMPRESSION: 1. No acute calvarial fracture or intracranial abnormality.
2. No acute fracture or dislocation of cervical spine.
3. Mild chronic microvascular ischemic changes and mild parenchymal
volume loss of the brain.
4. Ethmoid sinus disease and small right maxillary fluid level which
may represent acute sinusitis or be related to trauma.
5. C3-C5 anterior fusion. Mild to moderate bony canal stenosis at C4
and C5 from posterior ossific ridging.

By: Edu Luis Crespin M.D.

## 2019-12-17 DEATH — deceased
